# Patient Record
Sex: Male | Born: 1950 | Race: White | Hispanic: No | Smoking: Former smoker
Health system: Southern US, Community
[De-identification: ages and names within clinical notes are randomized; demographics above are authoritative.]

## PROBLEM LIST (undated history)

## (undated) DIAGNOSIS — Z8489 Family history of other specified conditions: Secondary | ICD-10-CM

## (undated) DIAGNOSIS — G8929 Other chronic pain: Secondary | ICD-10-CM

## (undated) DIAGNOSIS — R251 Tremor, unspecified: Secondary | ICD-10-CM

## (undated) DIAGNOSIS — M549 Dorsalgia, unspecified: Secondary | ICD-10-CM

## (undated) DIAGNOSIS — M1990 Unspecified osteoarthritis, unspecified site: Secondary | ICD-10-CM

## (undated) DIAGNOSIS — S8290XA Unspecified fracture of unspecified lower leg, initial encounter for closed fracture: Secondary | ICD-10-CM

## (undated) DIAGNOSIS — K219 Gastro-esophageal reflux disease without esophagitis: Secondary | ICD-10-CM

## (undated) DIAGNOSIS — Z9989 Dependence on other enabling machines and devices: Secondary | ICD-10-CM

## (undated) DIAGNOSIS — G4733 Obstructive sleep apnea (adult) (pediatric): Secondary | ICD-10-CM

## (undated) DIAGNOSIS — Z8619 Personal history of other infectious and parasitic diseases: Secondary | ICD-10-CM

## (undated) DIAGNOSIS — Z860100 Personal history of colon polyps, unspecified: Secondary | ICD-10-CM

## (undated) DIAGNOSIS — Z87442 Personal history of urinary calculi: Secondary | ICD-10-CM

## (undated) DIAGNOSIS — G4719 Other hypersomnia: Secondary | ICD-10-CM

## (undated) DIAGNOSIS — J302 Other seasonal allergic rhinitis: Secondary | ICD-10-CM

## (undated) DIAGNOSIS — I421 Obstructive hypertrophic cardiomyopathy: Secondary | ICD-10-CM

## (undated) DIAGNOSIS — I472 Ventricular tachycardia: Secondary | ICD-10-CM

## (undated) DIAGNOSIS — E7800 Pure hypercholesterolemia, unspecified: Secondary | ICD-10-CM

## (undated) DIAGNOSIS — I4729 Other ventricular tachycardia: Secondary | ICD-10-CM

## (undated) DIAGNOSIS — J449 Chronic obstructive pulmonary disease, unspecified: Secondary | ICD-10-CM

## (undated) HISTORY — DX: Unspecified osteoarthritis, unspecified site: M19.90

## (undated) HISTORY — PX: COLONOSCOPY: SHX174

## (undated) HISTORY — DX: Other ventricular tachycardia: I47.29

## (undated) HISTORY — DX: Other hypersomnia: G47.19

## (undated) HISTORY — PX: COLONOSCOPY W/ BIOPSIES AND POLYPECTOMY: SHX1376

## (undated) HISTORY — DX: Personal history of colon polyps, unspecified: Z86.0100

## (undated) HISTORY — DX: Ventricular tachycardia: I47.2

## (undated) HISTORY — DX: Obstructive hypertrophic cardiomyopathy: I42.1

## (undated) HISTORY — DX: Tremor, unspecified: R25.1

## (undated) HISTORY — DX: Personal history of other infectious and parasitic diseases: Z86.19

## (undated) HISTORY — PX: WISDOM TOOTH EXTRACTION: SHX21

## (undated) HISTORY — PX: TONSILLECTOMY AND ADENOIDECTOMY: SUR1326

## (undated) HISTORY — DX: Personal history of colonic polyps: Z86.010

## (undated) HISTORY — DX: Unspecified fracture of unspecified lower leg, initial encounter for closed fracture: S82.90XA

## (undated) HISTORY — DX: Gastro-esophageal reflux disease without esophagitis: K21.9

## (undated) HISTORY — DX: Other seasonal allergic rhinitis: J30.2

## (undated) HISTORY — DX: Chronic obstructive pulmonary disease, unspecified: J44.9

---

## 2015-01-15 DIAGNOSIS — I10 Essential (primary) hypertension: Secondary | ICD-10-CM | POA: Insufficient documentation

## 2015-04-02 ENCOUNTER — Encounter: Payer: Self-pay | Admitting: Gastroenterology

## 2015-04-02 HISTORY — PX: COLONOSCOPY W/ BIOPSIES AND POLYPECTOMY: SHX1376

## 2015-07-31 ENCOUNTER — Ambulatory Visit: Payer: Self-pay | Admitting: Physician Assistant

## 2015-07-31 VITALS — BP 118/58 | HR 75 | Temp 97.5°F | Resp 16 | Ht 76.0 in | Wt 294.6 lb

## 2015-07-31 DIAGNOSIS — Z0283 Encounter for blood-alcohol and blood-drug test: Secondary | ICD-10-CM
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
## 2015-08-06 LAB — BARBITURATES (GC/LC/MS), URINE
Amobarbital: NEGATIVE ng/mL
Butalbital: NEGATIVE ng/mL
Pentabarbital: NEGATIVE ng/mL
Phenobarbital: 2017 ng/mL — AB
Secobarbital: NEGATIVE ng/mL

## 2015-08-07 LAB — PRESCRIPTION MONITORING PROFILE (10 PANEL)
AMPHETAMINE/METH: NEGATIVE ng/mL
BENZODIAZEPINE SCREEN, URINE: NEGATIVE ng/mL
BUPRENORPHINE, URINE: NEGATIVE ng/mL
CANNABINOID SCRN UR: NEGATIVE ng/mL
COCAINE METABOLITES: NEGATIVE ng/mL
CREATININE, URINE: 158.24 mg/dL (ref >20.0)
METHADONE SCREEN, URINE: NEGATIVE ng/mL
Nitrites, Initial: NEGATIVE ug/mL
OXYCODONE SCRN UR: NEGATIVE ng/mL
Opiate Screen, Urine: NEGATIVE ng/mL
Propoxyphene: NEGATIVE ng/mL
pH, Initial: 5.6 pH (ref 4.5–8.9)

## 2015-10-01 ENCOUNTER — Telehealth: Payer: Self-pay

## 2015-10-04 ENCOUNTER — Encounter: Payer: Self-pay | Admitting: Physician Assistant

## 2015-10-04 ENCOUNTER — Ambulatory Visit (INDEPENDENT_AMBULATORY_CARE_PROVIDER_SITE_OTHER): Payer: BLUE CROSS/BLUE SHIELD | Admitting: Physician Assistant

## 2015-10-04 VITALS — BP 146/80 | HR 60 | Temp 97.8°F | Resp 16 | Ht 75.25 in | Wt 293.4 lb

## 2015-10-04 DIAGNOSIS — R251 Tremor, unspecified: Secondary | ICD-10-CM

## 2015-10-04 DIAGNOSIS — Z23 Encounter for immunization: Secondary | ICD-10-CM | POA: Diagnosis not present

## 2015-10-04 DIAGNOSIS — J302 Other seasonal allergic rhinitis: Secondary | ICD-10-CM

## 2015-10-04 DIAGNOSIS — R5383 Other fatigue: Secondary | ICD-10-CM | POA: Diagnosis not present

## 2015-10-04 DIAGNOSIS — Z125 Encounter for screening for malignant neoplasm of prostate: Secondary | ICD-10-CM

## 2015-10-04 LAB — CBC
HEMATOCRIT: 48.8 % (ref 39.0–52.0)
HEMOGLOBIN: 16.5 g/dL (ref 13.0–17.0)
MCHC: 33.7 g/dL (ref 30.0–36.0)
MCV: 97.3 fl (ref 78.0–100.0)
PLATELETS: 177 10*3/uL (ref 150.0–400.0)
RBC: 5.01 Mil/uL (ref 4.22–5.81)
RDW: 13.1 % (ref 11.5–15.5)
WBC: 8.7 10*3/uL (ref 4.0–10.5)

## 2015-10-04 LAB — VITAMIN D 25 HYDROXY (VIT D DEFICIENCY, FRACTURES): VITD: 37.98 ng/mL (ref 30.00–100.00)

## 2015-10-04 LAB — TESTOSTERONE: TESTOSTERONE: 302.14 ng/dL (ref 300.00–890.00)

## 2015-10-04 LAB — PSA: PSA: 1.84 ng/mL (ref 0.10–4.00)

## 2015-10-04 MED ORDER — AZELASTINE HCL 0.1 % NA SOLN: 2.0000 | Freq: Two times a day (BID) | NASAL | Status: DC

## 2015-10-04 NOTE — Progress Notes (Signed)
Pre visit review using our clinic review tool, if applicable. No additional management support is needed unless otherwise documented below in the visit note/SLS  

## 2015-10-04 NOTE — Telephone Encounter (Signed)
Pre Visit call completed. 

## 2015-10-04 NOTE — Patient Instructions (Signed)
Please continue medications as directed. Stop the Flonase for now and try the Astelin nasal spray I have sent in for you. Continue the Zyrtec.  Call me if symptoms not improving within 1-2 weeks.  Please stop by the lab for blood work. I will call you with your results. We will treat based on findings.  Your flu shot is up-to-date for the year now.  Follow-up will be scheduled based on lab results.

## 2015-10-05 DIAGNOSIS — J302 Other seasonal allergic rhinitis: Secondary | ICD-10-CM | POA: Insufficient documentation

## 2015-10-05 DIAGNOSIS — Z23 Encounter for immunization: Secondary | ICD-10-CM | POA: Insufficient documentation

## 2015-10-05 DIAGNOSIS — R5383 Other fatigue: Secondary | ICD-10-CM | POA: Insufficient documentation

## 2015-10-05 DIAGNOSIS — Z125 Encounter for screening for malignant neoplasm of prostate: Secondary | ICD-10-CM | POA: Insufficient documentation

## 2015-10-05 DIAGNOSIS — R251 Tremor, unspecified: Secondary | ICD-10-CM | POA: Insufficient documentation

## 2015-10-05 NOTE — Assessment & Plan Note (Signed)
Flu shot given by nursing staff. 

## 2015-10-05 NOTE — Assessment & Plan Note (Signed)
Rx Astelin nasal spray. Resume Zyrtec. Follow-up if not improving.

## 2015-10-05 NOTE — Assessment & Plan Note (Signed)
Followed by Neurology. Doing well. Continue current regimen.

## 2015-10-05 NOTE — Progress Notes (Signed)
Patient presents to clinic today to establish care.  Acute Concerns: Patient complains of year-round seasonal allergies, not alleviated by Flonase. Endorses rhinorrhea, watery eyes, and sneezing daily. Endorses sometimes having a dry cough with these symptoms.  Patient also complains of fatigue associated with decreased libido and erectile dysfunction. Is requesting testosterone level check.  Chronic Issues: Tremor -- Followed by Neurology at [facility]. Is currently on combination of primidone, gabapentin and propranolol with good relief of symptoms.  Past Medical History  Diagnosis Date  . Tremors of nervous system   . History of chicken pox   . Arthritis   . GERD (gastroesophageal reflux disease)   . Seasonal allergies   . Environmental allergies   . Kidney stones   . Allergic rhinitis   . Colon polyps   . Carbon dioxide poisoning   . Broken leg     Past Surgical History  Procedure Laterality Date  . Tonsillectomy and adenoidectomy    . Wisdom tooth extraction      Current Outpatient Prescriptions on File Prior to Visit  Medication Sig Dispense Refill  . aspirin 81 MG tablet Take 81 mg by mouth daily.    . Cetirizine HCl 10 MG CAPS Take 10 mg by mouth daily.    . fluticasone (FLONASE) 50 MCG/ACT nasal spray Place into both nostrils daily.    Marland Kitchen gabapentin (NEURONTIN) 800 MG tablet Take 800 mg by mouth 4 (four) times daily.    . primidone (MYSOLINE) 50 MG tablet Take 150 mg by mouth 2 (two) times daily.    . tamsulosin (FLOMAX) 0.4 MG CAPS capsule Take 0.4 mg by mouth at bedtime.    . propranolol (INDERAL) 40 MG tablet Take 40 mg by mouth daily.     No current facility-administered medications on file prior to visit.    Allergies  Allergen Reactions  . Codeine Nausea And Vomiting    All Codeine Related Drugs     Family History  Problem Relation Age of Onset  . Alcoholism Father     Living  . Arthritis Father   . Diabetes Maternal Aunt   . Heart disease  Mother   . Congestive Heart Failure Mother 67    Deceased  . Cancer Other     PGGM  . Breast cancer Maternal Aunt   . Lung cancer Maternal Uncle   . Heart disease Brother   . Heart attack Brother   . Congestive Heart Failure Brother 47    Deceased  . Stroke Maternal Aunt   . Emphysema Brother     #2  . Arthritis Sister     #1  . Allergies Daughter   . Kidney Stones Daughter   . Gallbladder disease Daughter   . Migraines Daughter     Social History   Social History  . Marital Status: Single    Spouse Name: N/A  . Number of Children: 3  . Years of Education: N/A   Occupational History  . [employer] Technician    Social History Main Topics  . Smoking status: Former Smoker    Quit date: 11/20/1998  . Smokeless tobacco: Not on file  . Alcohol Use: Not on file  . Drug Use: Not on file  . Sexual Activity: Not on file   Other Topics Concern  . Not on file   Social History Narrative    Review of Systems  Constitutional: Positive for malaise/fatigue. Negative for fever and weight loss.  HENT: Positive for congestion. Negative for ear  discharge, ear pain, hearing loss and tinnitus.   Eyes: Negative for blurred vision, double vision, photophobia and pain.  Respiratory: Negative for cough and shortness of breath.   Cardiovascular: Negative for chest pain and palpitations.  Gastrointestinal: Negative for heartburn, nausea, vomiting, abdominal pain, diarrhea, constipation, blood in stool and melena.  Genitourinary: Negative for dysuria, urgency, frequency, hematuria and flank pain.  Musculoskeletal: Negative for falls.  Neurological: Positive for tremors. Negative for dizziness, loss of consciousness and headaches.  Endo/Heme/Allergies: Negative for environmental allergies.  Psychiatric/Behavioral: Negative for depression, suicidal ideas, hallucinations and substance abuse. The patient is not nervous/anxious and does not have insomnia.    BP 146/80 mmHg  Pulse 60  Temp(Src)  97.8 F (36.6 C) (Oral)  Resp 16  Ht 6' 3.25" (1.911 m)  Wt 293 lb 6 oz (133.074 kg)  BMI 36.44 kg/m2  SpO2 95%  Physical Exam  Constitutional: He is oriented to person, place, and time and well-developed, well-nourished, and in no distress.  HENT:  Head: Normocephalic and atraumatic.  Right Ear: External ear normal.  Left Ear: External ear normal.  Nose: Nose normal.  Mouth/Throat: Oropharynx is clear and moist. No oropharyngeal exudate.  TM within normal limits bilaterally.  Eyes: Conjunctivae are normal.  Neck: Neck supple.  Cardiovascular: Normal rate, regular rhythm, normal heart sounds and intact distal pulses.   Neurological: He is alert and oriented to person, place, and time.  Skin: Skin is warm and dry. No rash noted.  Psychiatric: Affect normal.  Vitals reviewed.   Recent Results (from the past 2160 hour(s))  Prescript Monitor Profile(10)     Status: None   Collection Time: 07/31/15  9:04 AM  Result Value Ref Range   Creatinine, Urine 158.24 >20.0 mg/dL   pH, Initial 5.6 4.5 - 8.9 pH   Nitrites, Initial NEG Cutoff:200 ug/mL   Amphetamine/Meth NEG Cutoff:500 ng/mL   Barbiturate Screen, Urine PPS Cutoff:200 ng/mL   Benzodiazepine Screen, Urine NEG Cutoff:100 ng/mL   Buprenorphine, Urine NEG Cutoff:10 ng/mL   Cannabinoid Scrn, Ur NEG Cutoff:50 ng/mL   Cocaine Metabolites NEG Cutoff:150 ng/mL   Methadone Screen, Urine NEG Cutoff:300 ng/mL   Oxycodone Screen, Ur NEG Cutoff:100 ng/mL   Propoxyphene NEG Cutoff:300 ng/mL   Opiate Screen, Urine NEG Cutoff:100 ng/mL   Prescribed Drug 1 NONE PROVIDED     Comment: * (PPS) Presumptive positive screen result to be verified by         quantitative LC/MS or GC/MS confirmation testing.   Barbiturates (GC/LC/MS), urine     Status: Abnormal   Collection Time: 07/31/15  9:04 AM  Result Value Ref Range   Amobarbital NEG <100 ng/mL   Butalbital NEG <100 ng/mL   Pentabarbital NEG <100 ng/mL   Secobarbital NEG <100 ng/mL    Phenobarbital 2017 (A) <100 ng/mL  Testosterone     Status: None   Collection Time: 10/04/15 10:36 AM  Result Value Ref Range   Testosterone 302.14 300.00 - 890.00 ng/dL  PSA     Status: None   Collection Time: 10/04/15 10:36 AM  Result Value Ref Range   PSA 1.84 0.10 - 4.00 ng/mL  Vitamin D (25 hydroxy)     Status: None   Collection Time: 10/04/15 10:36 AM  Result Value Ref Range   VITD 37.98 30.00 - 100.00 ng/mL  CBC     Status: None   Collection Time: 10/04/15 10:36 AM  Result Value Ref Range   WBC 8.7 4.0 - 10.5 K/uL   RBC  5.01 4.22 - 5.81 Mil/uL   Platelets 177.0 150.0 - 400.0 K/uL   Hemoglobin 16.5 13.0 - 17.0 g/dL   HCT 21.348.8 08.639.0 - 57.852.0 %   MCV 97.3 78.0 - 100.0 fl   MCHC 33.7 30.0 - 36.0 g/dL   RDW 46.913.1 62.911.5 - 52.815.5 %    Assessment/Plan: Encounter for immunization Flu shot given by nursing staff.  Other fatigue Will obtain lab panel to include testosterone level to assess.  Prostate cancer screening Will obtain PSA level today.  Seasonal allergies Rx Astelin nasal spray. Resume Zyrtec. Follow-up if not improving.  Tremor Followed by Neurology. Doing well. Continue current regimen.

## 2015-10-05 NOTE — Assessment & Plan Note (Signed)
Will obtain lab panel to include testosterone level to assess.

## 2015-10-05 NOTE — Assessment & Plan Note (Signed)
Will obtain PSA level today. 

## 2015-11-25 ENCOUNTER — Ambulatory Visit: Payer: BLUE CROSS/BLUE SHIELD | Admitting: Family Medicine

## 2015-11-26 ENCOUNTER — Encounter: Payer: Self-pay | Admitting: Family Medicine

## 2015-11-26 ENCOUNTER — Telehealth: Payer: Self-pay

## 2015-11-26 ENCOUNTER — Ambulatory Visit (INDEPENDENT_AMBULATORY_CARE_PROVIDER_SITE_OTHER): Payer: BLUE CROSS/BLUE SHIELD | Admitting: Family Medicine

## 2015-11-26 VITALS — BP 110/78 | HR 52 | Temp 97.9°F | Resp 16 | Ht 75.25 in | Wt 302.5 lb

## 2015-11-26 DIAGNOSIS — J0100 Acute maxillary sinusitis, unspecified: Secondary | ICD-10-CM | POA: Diagnosis not present

## 2015-11-26 MED ORDER — AMOXICILLIN 875 MG PO TABS: 875.0000 mg | ORAL_TABLET | Freq: Two times a day (BID) | ORAL | Status: AC

## 2015-11-26 NOTE — Telephone Encounter (Signed)
Patient is receiving a $500 bill from [facility] where we did a drug screen on this patient.  Can someone for lab take a look at this and let the patient know why he is receiving this bill.

## 2015-11-26 NOTE — Telephone Encounter (Signed)
Not really sure why we did this as a [facility] since this was before we switched to [facility]. Talked to Vanessa DurhamKim Summers. Sounds like we may have already billed his company for this DS. Just doctor billed his [facility] lab since this was clearly a mistake on our part.

## 2015-11-26 NOTE — Progress Notes (Signed)
Pre visit review using our clinic review tool, if applicable. No additional management support is needed unless otherwise documented below in the visit note. 

## 2015-11-26 NOTE — Progress Notes (Signed)
OFFICE VISIT  11/26/2015   CC:  Chief Complaint  Patient presents with  . URI    x 2 weeks   HPI:    Patient is a 65 y.o. Caucasian male who presents for respiratory complaints. Onset about 2 wks ago nasal congestion/sinus pressure and pain, PND, ST, a little cough.   No fevers since the early part of the illness.  +HA.  No n/v/d.  No SOB/wheeze or body aches. Tried vicks and sudafed on top of the chronic zyrtec, astelin, and flonase he uses.   Past Medical History  Diagnosis Date  . Tremors of nervous system   . History of chicken pox   . Arthritis   . GERD (gastroesophageal reflux disease)   . Seasonal allergies   . Environmental allergies   . Kidney stones   . Allergic rhinitis   . Colon polyps   . Carbon dioxide poisoning   . Broken leg     Past Surgical History  Procedure Laterality Date  . Tonsillectomy and adenoidectomy    . Wisdom tooth extraction      Outpatient Prescriptions Prior to Visit  Medication Sig Dispense Refill  . Ascorbic Acid (VITAMIN C) 1000 MG tablet Take 1,000 mg by mouth daily.    Marland Kitchen. aspirin 81 MG tablet Take 81 mg by mouth daily.    Marland Kitchen. azelastine (ASTELIN) 0.1 % nasal spray Place 2 sprays into both nostrils 2 (two) times daily. Use in each nostril as directed 30 mL 12  . Cetirizine HCl 10 MG CAPS Take 10 mg by mouth daily.    . Cholecalciferol (VITAMIN D-3) 1000 UNITS CAPS Take 1,000 Units by mouth daily.    . fluticasone (FLONASE) 50 MCG/ACT nasal spray Place into both nostrils daily.    Marland Kitchen. gabapentin (NEURONTIN) 800 MG tablet Take 800 mg by mouth 4 (four) times daily.    Marland Kitchen. ibuprofen (ADVIL,MOTRIN) 200 MG tablet Take 200 mg by mouth every 6 (six) hours as needed.    . primidone (MYSOLINE) 50 MG tablet Take 150 mg by mouth 2 (two) times daily.    . propranolol (INDERAL) 40 MG tablet Take 40 mg by mouth daily.    . pseudoephedrine (SUDAFED) 30 MG tablet Take 30 mg by mouth every 4 (four) hours as needed for congestion.    . tamsulosin (FLOMAX)  0.4 MG CAPS capsule Take 0.4 mg by mouth at bedtime.    . vitamin B-12 (CYANOCOBALAMIN) 1000 MCG tablet Take 1,000 mcg by mouth daily.     No facility-administered medications prior to visit.    Allergies  Allergen Reactions  . Codeine Nausea And Vomiting    All Codeine Related Drugs     ROS As per HPI  PE: Blood pressure 110/78, pulse 52, temperature 97.9 F (36.6 C), temperature source Oral, resp. rate 16, height 6' 3.25" (1.911 m), weight 302 lb 8 oz (137.213 kg), SpO2 95 %. VS: noted--normal. Gen: alert, NAD, NONTOXIC APPEARING. HEENT: eyes without injection, drainage, or swelling.  Ears: EACs clear, TMs with normal light reflex and landmarks.  Nose: Clear rhinorrhea, with some dried, crusty exudate adherent to mildly injected mucosa.  No purulent d/c.  R>L paranasal sinus TTP.  No facial swelling.  Throat and mouth without focal lesion.  No pharyngial swelling, erythema, or exudate.   Neck: supple, no LAD.   LUNGS: CTA bilat, nonlabored resps.   CV: RRR, no m/r/g. EXT: no c/c/e SKIN: no rash  LABS:  none  IMPRESSION AND PLAN:  Acute  sinusitis, R>L maxillary. Amoxil 875mg  bid x 10d. Continue current symptomatic care. Signs/symptoms to call or return for were reviewed and pt expressed understanding.  An After Visit Summary was printed and given to the patient.  FOLLOW UP: Return if symptoms worsen or fail to improve.

## 2016-05-30 ENCOUNTER — Ambulatory Visit (INDEPENDENT_AMBULATORY_CARE_PROVIDER_SITE_OTHER): Payer: BLUE CROSS/BLUE SHIELD | Admitting: Physician Assistant

## 2016-05-30 ENCOUNTER — Encounter: Payer: Self-pay | Admitting: Physician Assistant

## 2016-05-30 VITALS — BP 125/69 | HR 44 | Temp 97.5°F | Resp 18 | Ht 75.0 in | Wt 308.1 lb

## 2016-05-30 DIAGNOSIS — Z0001 Encounter for general adult medical examination with abnormal findings: Secondary | ICD-10-CM | POA: Diagnosis not present

## 2016-05-30 DIAGNOSIS — R251 Tremor, unspecified: Secondary | ICD-10-CM | POA: Diagnosis not present

## 2016-05-30 DIAGNOSIS — F17210 Nicotine dependence, cigarettes, uncomplicated: Secondary | ICD-10-CM

## 2016-05-30 DIAGNOSIS — R9431 Abnormal electrocardiogram [ECG] [EKG]: Secondary | ICD-10-CM | POA: Diagnosis not present

## 2016-05-30 DIAGNOSIS — Z125 Encounter for screening for malignant neoplasm of prostate: Secondary | ICD-10-CM | POA: Diagnosis not present

## 2016-05-30 DIAGNOSIS — J302 Other seasonal allergic rhinitis: Secondary | ICD-10-CM | POA: Diagnosis not present

## 2016-05-30 DIAGNOSIS — R001 Bradycardia, unspecified: Secondary | ICD-10-CM

## 2016-05-30 DIAGNOSIS — Z0000 Encounter for general adult medical examination without abnormal findings: Secondary | ICD-10-CM

## 2016-05-30 DIAGNOSIS — Z6838 Body mass index (BMI) 38.0-38.9, adult: Secondary | ICD-10-CM

## 2016-05-30 LAB — CBC
HEMATOCRIT: 44.3 % (ref 39.0–52.0)
Hemoglobin: 15 g/dL (ref 13.0–17.0)
MCHC: 33.9 g/dL (ref 30.0–36.0)
MCV: 97.6 fl (ref 78.0–100.0)
PLATELETS: 148 10*3/uL — AB (ref 150.0–400.0)
RBC: 4.54 Mil/uL (ref 4.22–5.81)
RDW: 12.8 % (ref 11.5–15.5)
WBC: 5.9 10*3/uL (ref 4.0–10.5)

## 2016-05-30 LAB — COMPREHENSIVE METABOLIC PANEL
ALBUMIN: 4.2 g/dL (ref 3.5–5.2)
ALT: 22 U/L (ref 0–53)
AST: 26 U/L (ref 0–37)
Alkaline Phosphatase: 53 U/L (ref 39–117)
BUN: 17 mg/dL (ref 6–23)
CALCIUM: 9.4 mg/dL (ref 8.4–10.5)
CHLORIDE: 102 meq/L (ref 96–112)
CO2: 33 meq/L — AB (ref 19–32)
CREATININE: 0.94 mg/dL (ref 0.40–1.50)
GFR: 85.74 mL/min (ref >60.00)
Glucose, Bld: 94 mg/dL (ref 70–99)
POTASSIUM: 4.6 meq/L (ref 3.5–5.1)
SODIUM: 139 meq/L (ref 135–145)
Total Bilirubin: 0.5 mg/dL (ref 0.2–1.2)
Total Protein: 7 g/dL (ref 6.0–8.3)

## 2016-05-30 LAB — TSH: TSH: 2.83 u[IU]/mL (ref 0.35–4.50)

## 2016-05-30 LAB — LIPID PANEL
CHOL/HDL RATIO: 5
Cholesterol: 173 mg/dL (ref 0–200)
HDL: 32.7 mg/dL — ABNORMAL LOW (ref >39.00)
LDL CALC: 121 mg/dL — AB (ref 0–99)
NonHDL: 140.59
TRIGLYCERIDES: 100 mg/dL (ref 0.0–149.0)
VLDL: 20 mg/dL (ref 0.0–40.0)

## 2016-05-30 LAB — URINALYSIS, ROUTINE W REFLEX MICROSCOPIC
Bilirubin Urine: NEGATIVE
Hgb urine dipstick: NEGATIVE
Ketones, ur: NEGATIVE
LEUKOCYTES UA: NEGATIVE
Nitrite: NEGATIVE
PH: 6 (ref 5.0–8.0)
RBC / HPF: NONE SEEN (ref 0–?)
SPECIFIC GRAVITY, URINE: 1.02 (ref 1.000–1.030)
TOTAL PROTEIN, URINE-UPE24: NEGATIVE
UROBILINOGEN UA: 0.2 (ref 0.0–1.0)
Urine Glucose: NEGATIVE

## 2016-05-30 LAB — PSA: PSA: 1.38 ng/mL (ref 0.10–4.00)

## 2016-05-30 LAB — TROPONIN I: TNIDX: 0.02 ug/l (ref 0.00–0.06)

## 2016-05-30 LAB — HEMOGLOBIN A1C: HEMOGLOBIN A1C: 5.4 % (ref 4.6–6.5)

## 2016-05-30 MED ORDER — TAMSULOSIN HCL 0.4 MG PO CAPS: 0.4000 mg | ORAL_CAPSULE | Freq: Every day | ORAL | Status: DC

## 2016-05-30 NOTE — Progress Notes (Signed)
Pre visit review using our clinic review tool, if applicable. No additional management support is needed unless otherwise documented below in the visit note/SLS  

## 2016-05-30 NOTE — Progress Notes (Signed)
Patient presents to clinic today for annual exam.  Patient is fasting for labs.  Chronic Issues: Seasonal Allergies -- Is taking Astelin nasal spray, Flonase and Claritin with good relief of symptoms.   Tremor -- Followed by Neurology. Is currently on Propranolol and Gabapentin with recent dose change of Propranolol by Neurology.   Hx of Tobacco Abuse -- 60+ pack-year smoking history. Denies cough, hemoptysis, wheezing or history of COPD. Has never had Lung Ca screening.   Obesity  -- Body mass index is 38.51 kg/(m^2). Is currently staying very active with home projects. Does not have a regular exercise routine. Endorses well-balanced diet overall but notes more fried foods than he should have.  Health Maintenance: Immunizations -- Due for Tetanus. Will get TDaP today. Colonoscopy -- Endorses Colonoscopy in 2016 at [facility]. Endorses polyps and need for repeat colonoscopy in 5 years. HIV Screen -- Agrees to this. Is not sexually active but has been previously.  Hepatitis C Screen -- Will check coverage with insurance plan.   Past Medical History  Diagnosis Date  . Tremors of nervous system   . History of chicken pox   . Arthritis   . GERD (gastroesophageal reflux disease)   . Seasonal allergies   . Environmental allergies   . Kidney stones   . Allergic rhinitis   . Colon polyps   . Carbon dioxide poisoning   . Broken leg     Past Surgical History  Procedure Laterality Date  . Tonsillectomy and adenoidectomy    . Wisdom tooth extraction      Current Outpatient Prescriptions on File Prior to Visit  Medication Sig Dispense Refill  . Ascorbic Acid (VITAMIN C) 1000 MG tablet Take 1,000 mg by mouth daily.    Marland Kitchen aspirin 81 MG tablet Take 81 mg by mouth daily.    Marland Kitchen azelastine (ASTELIN) 0.1 % nasal spray Place 2 sprays into both nostrils 2 (two) times daily. Use in each nostril as directed 30 mL 12  . Cetirizine HCl 10 MG CAPS Take 10 mg by mouth daily.    .  Cholecalciferol (VITAMIN D-3) 1000 UNITS CAPS Take 1,000 Units by mouth daily.    . fluticasone (FLONASE) 50 MCG/ACT nasal spray Place into both nostrils daily.    Marland Kitchen gabapentin (NEURONTIN) 800 MG tablet Take 800 mg by mouth 4 (four) times daily.    Marland Kitchen ibuprofen (ADVIL,MOTRIN) 200 MG tablet Take 200 mg by mouth every 6 (six) hours as needed.    . primidone (MYSOLINE) 50 MG tablet Take 150 mg by mouth 2 (two) times daily.    . propranolol (INDERAL) 40 MG tablet Take 40 mg by mouth daily.    . pseudoephedrine (SUDAFED) 30 MG tablet Take 30 mg by mouth every 4 (four) hours as needed for congestion.    . tamsulosin (FLOMAX) 0.4 MG CAPS capsule Take 0.4 mg by mouth at bedtime.    . vitamin B-12 (CYANOCOBALAMIN) 1000 MCG tablet Take 1,000 mcg by mouth daily.     No current facility-administered medications on file prior to visit.    Allergies  Allergen Reactions  . Codeine Nausea And Vomiting    All Codeine Related Drugs     Family History  Problem Relation Age of Onset  . Alcoholism Father     Living  . Arthritis Father   . Diabetes Maternal Aunt   . Heart disease Mother   . Congestive Heart Failure Mother 31    Deceased  . Cancer  Other     PGGM  . Breast cancer Maternal Aunt   . Lung cancer Maternal Uncle   . Heart disease Brother   . Heart attack Brother   . Congestive Heart Failure Brother 47    Deceased  . Stroke Maternal Aunt   . Emphysema Brother     #2  . Arthritis Sister     #1  . Allergies Daughter   . Kidney Stones Daughter   . Gallbladder disease Daughter   . Migraines Daughter     Social History   Social History  . Marital Status: Single    Spouse Name: N/A  . Number of Children: 3  . 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 Smoking status: Former Smoker    Quit date: 11/20/1998  . Smokeless tobacco: Not on file  . Alcohol Use: Not on file  . Drug Use: Not on file  . Sexual Activity: Not on  file   Other Topics Concern  . Not on file   Social History Narrative   Review of Systems  Constitutional: Negative for fever and weight loss.  HENT: Negative for ear discharge, ear pain, hearing loss and tinnitus.   Eyes: Negative for blurred vision, double vision, photophobia and pain.  Respiratory: Negative for cough and shortness of breath.   Cardiovascular: Negative for chest pain and palpitations.  Gastrointestinal: Negative for heartburn, nausea, vomiting, abdominal pain, diarrhea, constipation, blood in stool and melena.  Genitourinary: Negative for dysuria, urgency, frequency, hematuria and flank pain.  Musculoskeletal: Positive for joint pain. Negative for falls.  Neurological: Positive for tremors. Negative for dizziness, loss of consciousness and headaches.  Endo/Heme/Allergies: Negative for environmental allergies.  Psychiatric/Behavioral: Negative for depression, suicidal ideas, hallucinations and substance abuse. The patient is not nervous/anxious and does not have insomnia.     Temp(Src) 97.5 F (36.4 C) (Oral)  Ht 6\' 3"  (1.905 m)  Wt 308 lb 2 oz (139.765 kg)  BMI 38.51 kg/m2  SpO2 96%  Physical Exam  Constitutional: He is oriented to person, place, and time and well-developed, well-nourished, and in no distress.  HENT:  Head: Normocephalic and atraumatic.  Right Ear: External ear normal.  Left Ear: External ear normal.  Nose: Nose normal.  Mouth/Throat: Oropharynx is clear and moist. No oropharyngeal exudate.  Eyes: Conjunctivae and EOM are normal. Pupils are equal, round, and reactive to light.  Neck: Neck supple. No thyromegaly present.  Cardiovascular: Normal rate, regular rhythm, normal heart sounds and intact distal pulses.   Pulmonary/Chest: Effort normal and breath sounds normal. No respiratory distress. He has no wheezes. He has no rales. He exhibits no tenderness.  Abdominal: Soft. Bowel sounds are normal. He exhibits no distension and no mass. There  is no tenderness. There is no rebound and no guarding.  Genitourinary: Testes/scrotum normal and penis normal. No discharge found.  Musculoskeletal:       Right shoulder: He exhibits pain. He exhibits normal range of motion, no tenderness, no bony tenderness, no swelling and normal strength.  Lymphadenopathy:    He has no cervical adenopathy.  Neurological: He is alert and oriented to person, place, and time. He displays tremor.  Skin: Skin is warm and dry. No rash noted.  Psychiatric: Affect normal.  Vitals reviewed.   No results found for this or any previous visit (from the past 2160 hour(s)).  Assessment/Plan: 1. Bradycardia Noted on examination. Asymptomatic. Secondary to significant  dose of Propranolol prescribed by Neurology for patient tremor. EKG obtained revealing sinus bradycardia with t abnormality . Will check TSH and CMP. Propranolol decreased from 40 mg BID to 20 mg BID. Close FU scheduled. Alarm signs/symptoms reviewed with patient. FU scheduled. - EKG 12-Lead - Comprehensive metabolic panel - TSH  2. Smoking greater than 40 pack years Asymptomatic. Good oxygen saturation. Will try to get low dose CT approved for lung cancer screening. - CT CHEST LUNG CANCER SCREENING LOW DOSE WO CONTRAST; Future  3. Prostate cancer screening The natural history of prostate cancer and ongoing controversy regarding screening and potential treatment outcomes of prostate cancer has been discussed with the patient. The meaning of a false positive PSA and a false negative PSA has been discussed. He indicates understanding of the limitations of this screening test and wishes to proceed with screening PSA testing.  - PSA  4. Tremor Followed by Neurology. Well controlled with Propranolol 40 mg BID. However this dose is causing dangerously low heart rate. Have decreased to 20 mg BID with close FU. Patient to contact Neurology to discuss other options for tremor.  5. Seasonal allergies Doing  very well. Continue current medication regimen.  6. Visit for preventive health examination Depression screen negative. Health Maintenance reviewed -- TDaP updated. Colonoscopy up-to-date. Patient will check on insurance coverage for Hep C screening. Preventive schedule discussed and handout given in AVS. Will obtain fasting labs today.  - CBC - Comprehensive metabolic panel - Hemoglobin A1c - Lipid panel - TSH - Urinalysis, Routine w reflex microscopic ([facility])  7. Abnormal finding on EKG EKG with marked sinus bradycardia. EKGg reads ST depression. Reviewed manually with no note of ST depression. Supervising MD, Dr. Abner GreenspanBlyth consulted who agrees no ST depression. There is T-abnormality noted. STAT troponin obtained and negative. Will work on improvement of bradycardia (see a/p). Referral to Cardiology placed for stress test and echo. - Troponin I  8. Body mass index (BMI) of 38.0-38.9 in adult Body mass index is 38.51 kg/(m^2). Diet and exercise guidelines reviewed with patient. Handout given. Will follow.   Piedad ClimesMartin, Tyrae Cody, PA-C

## 2016-05-30 NOTE — Patient Instructions (Addendum)
Please go to the lab for blood work.   Our office will call you with your results unless you have chosen to receive results via MyChart.  If your blood work is normal we will follow-up each year for physicals and as scheduled for chronic medical problems.  If anything is abnormal we will treat accordingly and get you in for a follow-up.  You will be contacted by Cardiology for assessment of EKG findings -- will likely be getting Echo and stress test. Decrease your Propranolol to once daily dosing. The current dose is causing your heart rate to be too low which may be causing decreased blood flow to the heart muscle.   Call your neurologist to make him aware of these changes. They will need to consider another medication for your tremor.  If you develop any chest pain or SOB, please go to the ER.  You will be contacted for assessment by General Surgery for hernia.  Follow-up with me Friday

## 2016-06-02 ENCOUNTER — Ambulatory Visit (INDEPENDENT_AMBULATORY_CARE_PROVIDER_SITE_OTHER): Payer: BLUE CROSS/BLUE SHIELD | Admitting: Physician Assistant

## 2016-06-02 ENCOUNTER — Encounter: Payer: Self-pay | Admitting: Physician Assistant

## 2016-06-02 VITALS — BP 122/78 | HR 50 | Temp 97.6°F | Resp 16 | Ht 75.0 in | Wt 305.5 lb

## 2016-06-02 DIAGNOSIS — R001 Bradycardia, unspecified: Secondary | ICD-10-CM

## 2016-06-02 MED ORDER — PROPRANOLOL HCL 10 MG PO TABS: 10.0000 mg | ORAL_TABLET | Freq: Two times a day (BID) | ORAL | Status: DC

## 2016-06-02 NOTE — Progress Notes (Signed)
Patient presents to clinic today for 3-day follow-up of significant asymptomatic bradycardia. Pulse noted to be 40 at last visit. Patient was on Propranolol 40 mg BID by his neurologist for tremor. EKG obtained at the time revealed sinus bradycardia with t-wave abnormality. No chest pain, palpitations. LH or dizziness. Referral to Card for stress testing and echo placed but no emergent concerns. Propranolol reduced to 20 mg BID. Patient endorses following instructions. Still denies chest pain, palpitations, lightheadedness, dizziness, vision changes or frequent headaches. Has contacted Neurology to discuss other options for tremor.    Past Medical History  Diagnosis Date  . Tremors of nervous system   . History of chicken pox   . Arthritis   . GERD (gastroesophageal reflux disease)   . Seasonal allergies   . Environmental allergies   . Kidney stones   . Allergic rhinitis   . Colon polyps   . Carbon dioxide poisoning   . Broken leg     Current Outpatient Prescriptions on File Prior to Visit  Medication Sig Dispense Refill  . Ascorbic Acid (VITAMIN C) 1000 MG tablet Take 1,000 mg by mouth daily.    Marland Kitchen aspirin 81 MG tablet Take 81 mg by mouth daily.    Marland Kitchen azelastine (ASTELIN) 0.1 % nasal spray Place 2 sprays into both nostrils 2 (two) times daily. Use in each nostril as directed 30 mL 12  . Cetirizine HCl 10 MG CAPS Take 10 mg by mouth daily.    . Cholecalciferol (VITAMIN D-3) 1000 UNITS CAPS Take 1,000 Units by mouth daily.    . fluticasone (FLONASE) 50 MCG/ACT nasal spray Place into both nostrils daily.    Marland Kitchen gabapentin (NEURONTIN) 800 MG tablet Take 800 mg by mouth 4 (four) times daily.    Marland Kitchen ibuprofen (ADVIL,MOTRIN) 200 MG tablet Take 200 mg by mouth every 6 (six) hours as needed.    . primidone (MYSOLINE) 50 MG tablet Take 150 mg by mouth 2 (two) times daily.    . tamsulosin (FLOMAX) 0.4 MG CAPS capsule Take 1 capsule (0.4 mg total) by mouth at bedtime. 30 capsule 5  . vitamin B-12  (CYANOCOBALAMIN) 1000 MCG tablet Take 1,000 mcg by mouth daily.     No current facility-administered medications on file prior to visit.    Allergies  Allergen Reactions  . Codeine Nausea And Vomiting    All Codeine Related Drugs     Family History  Problem Relation Age of Onset  . Alcoholism Father     Living  . Arthritis Father   . Diabetes Maternal Aunt   . Heart disease Mother   . Congestive Heart Failure Mother 25    Deceased  . Cancer Other     PGGM  . Breast cancer Maternal Aunt   . Lung cancer Maternal Uncle   . Heart disease Brother   . Heart attack Brother   . Congestive Heart Failure Brother 47    Deceased  . Stroke Maternal Aunt   . Emphysema Brother     #2  . Arthritis Sister     #1  . Allergies Daughter   . Kidney Stones Daughter   . Gallbladder disease Daughter   . Migraines Daughter     Social History   Social History  . Marital Status: Single    Spouse Name: N/A  . Number of Children: 3  . 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  Smoking status: Former Smoker -- 2.00 packs/day for 30 years    Types: Cigarettes    Quit date: 11/20/2000  . Smokeless tobacco: Never Used  . Alcohol Use: None  . Drug Use: None  . Sexual Activity: Not Asked   Other Topics Concern  . None   Social History Narrative   Review of Systems - See HPI.  All other ROS are negative.  BP 122/78 mmHg  Pulse 50  Temp(Src) 97.6 F (36.4 C) (Oral)  Resp 16  Ht  (1.905 m)  Wt 305 lb 8 oz (138.574 kg)  BMI 38.18 kg/m2  SpO2 97%  Physical Exam  Constitutional: He is oriented to person, place, and time and well-developed, well-nourished, and in no distress.  HENT:  Head: Normocephalic and atraumatic.  Eyes: Conjunctivae are normal.  Cardiovascular: Normal rate, regular rhythm, normal heart sounds and intact distal pulses.   Pulmonary/Chest: Effort normal. No respiratory distress. He has no wheezes.  He has no rales. He exhibits no tenderness.  Neurological: He is alert and oriented to person, place, and time.  Skin: Skin is warm and dry. No rash noted.  Psychiatric: Affect normal.  Vitals reviewed.   Recent Results (from the past 2160 hour(s))  CBC     Status: Abnormal   Collection Time: 05/30/16  8:56 AM  Result Value Ref Range   WBC 5.9 4.0 - 10.5 K/uL   RBC 4.54 4.22 - 5.81 Mil/uL   Platelets 148.0 (L) 150.0 - 400.0 K/uL   Hemoglobin 15.0 13.0 - 17.0 g/dL   HCT 40.9 81.1 - 91.4 %   MCV 97.6 78.0 - 100.0 fl   MCHC 33.9 30.0 - 36.0 g/dL   RDW 78.2 95.6 - 21.3 %  Comprehensive metabolic panel     Status: Abnormal   Collection Time: 05/30/16  8:56 AM  Result Value Ref Range   Sodium 139 135 - 145 mEq/L   Potassium 4.6 3.5 - 5.1 mEq/L   Chloride 102 96 - 112 mEq/L   CO2 33 (H) 19 - 32 mEq/L   Glucose, Bld 94 70 - 99 mg/dL   BUN 17 6 - 23 mg/dL   Creatinine, Ser 0.86 0.40 - 1.50 mg/dL   Total Bilirubin 0.5 0.2 - 1.2 mg/dL   Alkaline Phosphatase 53 39 - 117 U/L   AST 26 0 - 37 U/L   ALT 22 0 - 53 U/L   Total Protein 7.0 6.0 - 8.3 g/dL   Albumin 4.2 3.5 - 5.2 g/dL   Calcium 9.4 8.4 - 57.8 mg/dL   GFR 46.96 >29.52 mL/min  Hemoglobin A1c     Status: None   Collection Time: 05/30/16  8:56 AM  Result Value Ref Range   Hgb A1c MFr Bld 5.4 4.6 - 6.5 %    Comment: Glycemic Control Guidelines for People with Diabetes:Non Diabetic:  <6%Goal of Therapy: <7%Additional Action Suggested:  >8%   Lipid panel     Status: Abnormal   Collection Time: 05/30/16  8:56 AM  Result Value Ref Range   Cholesterol 173 0 - 200 mg/dL    Comment: ATP III Classification       Desirable:  < 200 mg/dL               Borderline High:  200 - 239 mg/dL          High:  > = 841 mg/dL   Triglycerides 324.4 0.0 - 149.0 mg/dL    Comment: Normal:  <010 mg/dLBorderline  High:  150 - 199 mg/dL   HDL 16.1032.70 (L) >96.04>39.00 mg/dL   VLDL 54.020.0 0.0 - 98.140.0 mg/dL   LDL Cholesterol 191121 (H) 0 - 99 mg/dL   Total CHOL/HDL  Ratio 5     Comment:                Men          Women1/2 Average Risk     3.4          3.3Average Risk          5.0          4.42X Average Risk          9.6          7.13X Average Risk          15.0          11.0                       NonHDL 140.59     Comment: NOTE:  Non-HDL goal should be 30 mg/dL higher than patient's LDL goal (i.e. LDL goal of < 70 mg/dL, would have non-HDL goal of < 100 mg/dL)  PSA     Status: None   Collection Time: 05/30/16  8:56 AM  Result Value Ref Range   PSA 1.38 0.10 - 4.00 ng/mL  TSH     Status: None   Collection Time: 05/30/16  8:56 AM  Result Value Ref Range   TSH 2.83 0.35 - 4.50 uIU/mL  Urinalysis, Routine w reflex microscopic ([facility])     Status: Abnormal   Collection Time: 05/30/16  8:56 AM  Result Value Ref Range   Color, Urine YELLOW Yellow;Lt. Yellow   APPearance CLEAR Clear   Specific Gravity, Urine 1.020 1.000-1.030   pH 6.0 5.0 - 8.0   Total Protein, Urine NEGATIVE Negative   Urine Glucose NEGATIVE Negative   Ketones, ur NEGATIVE Negative   Bilirubin Urine NEGATIVE Negative   Hgb urine dipstick NEGATIVE Negative   Urobilinogen, UA 0.2 0.0 - 1.0   Leukocytes, UA NEGATIVE Negative   Nitrite NEGATIVE Negative   WBC, UA 0-2/hpf 0-2/hpf   RBC / HPF none seen 0-2/hpf   Mucus, UA Presence of (A) None  Troponin I     Status: None   Collection Time: 05/30/16  8:56 AM  Result Value Ref Range   TNIDX 0.02 0.00 - 0.06 ug/l    Assessment/Plan: Bradycardia Heart rate improved to the low 50s. Still asymptomatic. Will decrease Propranolol to 10 mg BID. Will await stress test and echo results. FU scheduled. Again Alarm signs/symptoms discussed with patient. ER if these occur.     Piedad ClimesMartin, Taison Cody, PA-C

## 2016-06-02 NOTE — Assessment & Plan Note (Signed)
Heart rate improved to the low 50s. Still asymptomatic. Will decrease Propranolol to 10 mg BID. Will await stress test and echo results. FU scheduled. Again Alarm signs/symptoms discussed with patient. ER if these occur.

## 2016-06-02 NOTE — Patient Instructions (Signed)
Please start the new dose of Propranolol -- 10 mg twice daily. Make sure to call me once you have spoken with Neurology. Again you will be contacted for stress testing.  I will speak with insurance regarding the denial of your lung cancer screen.  FU with nurse in 1 week for a BP and heart rate recheck.

## 2016-06-02 NOTE — Progress Notes (Signed)
Pre visit review using our clinic review tool, if applicable. No additional management support is needed unless otherwise documented below in the visit note/SLS  

## 2016-06-09 ENCOUNTER — Ambulatory Visit (INDEPENDENT_AMBULATORY_CARE_PROVIDER_SITE_OTHER): Payer: BLUE CROSS/BLUE SHIELD | Admitting: Physician Assistant

## 2016-06-09 VITALS — BP 135/79 | HR 60

## 2016-06-09 DIAGNOSIS — R001 Bradycardia, unspecified: Secondary | ICD-10-CM | POA: Diagnosis not present

## 2016-06-09 NOTE — Patient Instructions (Signed)
Per Malva Coganody Martin, PA-C: Continue current medication regimen and follow-up with PCP in 2 months.

## 2016-06-09 NOTE — Progress Notes (Signed)
Pre visit review using our clinic review tool, if applicable. No additional management support is needed unless otherwise documented below in the visit note.  Patient presents in office for blood pressure check. Reviewed medication with the patient. Today's reading was BP 135/79 & P 60.  Per Malva Coganody Martin, PA-C: Continue current medication regimen and follow-up with PCP in 2 months.  Informed patient of the provider's instructions. He verbalized understanding and did not have any questions or concerns prior to leaving nurse visit.  Next appointment scheduled for 08/15/16 at 3:45 PM.

## 2016-06-10 NOTE — Progress Notes (Signed)
Reviewed. Plan is as described on RN note.

## 2016-06-19 ENCOUNTER — Encounter: Payer: Self-pay | Admitting: Cardiology

## 2016-06-29 ENCOUNTER — Encounter: Payer: Self-pay | Admitting: Cardiology

## 2016-07-03 ENCOUNTER — Ambulatory Visit: Payer: BLUE CROSS/BLUE SHIELD | Admitting: Cardiology

## 2016-07-10 ENCOUNTER — Ambulatory Visit (INDEPENDENT_AMBULATORY_CARE_PROVIDER_SITE_OTHER): Payer: BLUE CROSS/BLUE SHIELD | Admitting: Physician Assistant

## 2016-07-10 ENCOUNTER — Ambulatory Visit: Payer: BLUE CROSS/BLUE SHIELD | Admitting: Physician Assistant

## 2016-07-10 ENCOUNTER — Encounter: Payer: Self-pay | Admitting: Physician Assistant

## 2016-07-10 VITALS — BP 116/68 | HR 58 | Temp 98.0°F | Resp 22 | Ht 75.0 in | Wt 313.5 lb

## 2016-07-10 DIAGNOSIS — J019 Acute sinusitis, unspecified: Secondary | ICD-10-CM

## 2016-07-10 DIAGNOSIS — B9689 Other specified bacterial agents as the cause of diseases classified elsewhere: Secondary | ICD-10-CM

## 2016-07-10 MED ORDER — BENZONATATE 100 MG PO CAPS: 100.0000 mg | ORAL_CAPSULE | Freq: Three times a day (TID) | ORAL | 0 refills | Status: DC | PRN

## 2016-07-10 MED ORDER — ALBUTEROL SULFATE HFA 108 (90 BASE) MCG/ACT IN AERS: 2.0000 | INHALATION_SPRAY | Freq: Four times a day (QID) | RESPIRATORY_TRACT | 0 refills | Status: DC | PRN

## 2016-07-10 MED ORDER — AMOXICILLIN-POT CLAVULANATE 875-125 MG PO TABS: 1.0000 | ORAL_TABLET | Freq: Two times a day (BID) | ORAL | 0 refills | Status: DC

## 2016-07-10 NOTE — Progress Notes (Signed)
Patient presents to clinic today c/o > 1 week of chest congestion, head congestion with sinus pressure and sinus pain, ear pain and chills. Denies fever, recent travel or sick contact. Notes mild chest congestion with cough productive of thick yellow sputum. Denies chest pain or SOB.  Past Medical History:  Diagnosis Date  . Allergic rhinitis   . Arthritis   . Broken leg   . Carbon dioxide poisoning   . Colon polyps   . Environmental allergies   . GERD (gastroesophageal reflux disease)   . History of chicken pox   . Kidney stones   . Seasonal allergies   . Tremors of nervous system     Current Outpatient Prescriptions on File Prior to Visit  Medication Sig Dispense Refill  . Ascorbic Acid (VITAMIN C) 1000 MG tablet Take 1,000 mg by mouth daily.    Marland Kitchen. aspirin 81 MG tablet Take 81 mg by mouth daily.    Marland Kitchen. azelastine (ASTELIN) 0.1 % nasal spray Place 2 sprays into both nostrils 2 (two) times daily. Use in each nostril as directed 30 mL 12  . Cetirizine HCl 10 MG CAPS Take 10 mg by mouth daily.    . Cholecalciferol (VITAMIN D-3) 1000 UNITS CAPS Take 1,000 Units by mouth daily.    . fluticasone (FLONASE) 50 MCG/ACT nasal spray Place into both nostrils daily.    Marland Kitchen. gabapentin (NEURONTIN) 800 MG tablet Take 800 mg by mouth 4 (four) times daily.    Marland Kitchen. ibuprofen (ADVIL,MOTRIN) 200 MG tablet Take 200 mg by mouth every 6 (six) hours as needed.    . primidone (MYSOLINE) 50 MG tablet Take 150 mg by mouth 2 (two) times daily.    . tamsulosin (FLOMAX) 0.4 MG CAPS capsule Take 1 capsule (0.4 mg total) by mouth at bedtime. 30 capsule 5  . vitamin B-12 (CYANOCOBALAMIN) 1000 MCG tablet Take 1,000 mcg by mouth daily.     No current facility-administered medications on file prior to visit.     Allergies  Allergen Reactions  . Codeine Nausea And Vomiting    All Codeine Related Drugs     Family History  Problem Relation Age of Onset  . Heart disease Mother   . Congestive Heart Failure Mother [id number]      Deceased  . Alcoholism Father     Living  . Arthritis Father   . Diabetes Maternal Aunt   . Cancer Other     PGGM  . Breast cancer Maternal Aunt   . Lung cancer Maternal Uncle   . Heart disease Brother   . Heart attack Brother   . Congestive Heart Failure Brother 47    Deceased  . Stroke Maternal Aunt   . Emphysema Brother     #2  . Arthritis Sister     #1  . Allergies Daughter   . Kidney Stones Daughter   . Gallbladder disease Daughter   . Migraines Daughter     Social History   Social History  . Marital status: Single    Spouse name: N/A  . Number of children: 3  . Years of education: N/A   Occupational History  . [employer]    Social History Main Topics  . Smoking status: Former Smoker    Packs/day: 2.00    Years: 30.00    Types: Cigarettes    Quit date: 11/20/2000  . Smokeless tobacco: Never Used  . Alcohol use None  . Drug use: Unknown  . Sexual activity: Not Asked  Other Topics Concern  . None   Social History Narrative  . None    Review of Systems - See HPI.  All other ROS are negative.  BP 116/68 (BP Location: Right Arm, Patient Position: Sitting, Cuff Size: Large)   Pulse (!) 58   Temp 98 F (36.7 C) (Oral)   Resp (!) 22   Ht 6\' 3"  (1.905 m)   Wt (!) 313 lb 8 oz (142.2 kg)   SpO2 95%   BMI 39.18 kg/m   Physical Exam  Constitutional: He is well-developed, well-nourished, and in no distress.  HENT:  Head: Normocephalic and atraumatic.  Right Ear: Tympanic membrane normal.  Left Ear: Tympanic membrane normal.  Nose: Mucosal edema and rhinorrhea present. Right sinus exhibits maxillary sinus tenderness. Left sinus exhibits maxillary sinus tenderness.  Mouth/Throat: Uvula is midline, oropharynx is clear and moist and mucous membranes are normal.  Eyes: Conjunctivae are normal.  Neck: Neck supple.  Cardiovascular: Normal rate, regular rhythm, normal heart sounds and intact distal pulses.   Pulmonary/Chest: Effort normal and breath  sounds normal. No respiratory distress. He has no wheezes. He has no rales. He exhibits no tenderness.  Skin: Skin is warm and dry. No rash noted.  Psychiatric: Affect normal.  Vitals reviewed.   Recent Results (from the past 2160 hour(s))  CBC     Status: Abnormal   Collection Time: 05/30/16  8:56 AM  Result Value Ref Range   WBC 5.9 4.0 - 10.5 K/uL   RBC 4.54 4.22 - 5.81 Mil/uL   Platelets 148.0 (L) 150.0 - 400.0 K/uL   Hemoglobin 15.0 13.0 - 17.0 g/dL   HCT 16.1 09.6 - 04.5 %   MCV 97.6 78.0 - 100.0 fl   MCHC 33.9 30.0 - 36.0 g/dL   RDW 40.9 81.1 - 91.4 %  Comprehensive metabolic panel     Status: Abnormal   Collection Time: 05/30/16  8:56 AM  Result Value Ref Range   Sodium 139 135 - 145 mEq/L   Potassium 4.6 3.5 - 5.1 mEq/L   Chloride 102 96 - 112 mEq/L   CO2 33 (H) 19 - 32 mEq/L   Glucose, Bld 94 70 - 99 mg/dL   BUN 17 6 - 23 mg/dL   Creatinine, Ser 7.82 0.40 - 1.50 mg/dL   Total Bilirubin 0.5 0.2 - 1.2 mg/dL   Alkaline Phosphatase 53 39 - 117 U/L   AST 26 0 - 37 U/L   ALT 22 0 - 53 U/L   Total Protein 7.0 6.0 - 8.3 g/dL   Albumin 4.2 3.5 - 5.2 g/dL   Calcium 9.4 8.4 - 95.6 mg/dL   GFR 21.30 >86.57 mL/min  Hemoglobin A1c     Status: None   Collection Time: 05/30/16  8:56 AM  Result Value Ref Range   Hgb A1c MFr Bld 5.4 4.6 - 6.5 %    Comment: Glycemic Control Guidelines for People with Diabetes:Non Diabetic:  <6%Goal of Therapy: <7%Additional Action Suggested:  >8%   Lipid panel     Status: Abnormal   Collection Time: 05/30/16  8:56 AM  Result Value Ref Range   Cholesterol 173 0 - 200 mg/dL    Comment: ATP III Classification       Desirable:  < 200 mg/dL               Borderline High:  200 - 239 mg/dL          High:  > = 846 mg/dL  Triglycerides 100.0 0.0 - 149.0 mg/dL    Comment: Normal:  <657<150 mg/dLBorderline High:  150 - 199 mg/dL   HDL 84.6932.70 (L) >62.95>39.00 mg/dL   VLDL 28.420.0 0.0 - 13.240.0 mg/dL   LDL Cholesterol 440121 (H) 0 - 99 mg/dL   Total CHOL/HDL Ratio 5      Comment:                Men          Women1/2 Average Risk     3.4          3.3Average Risk          5.0          4.42X Average Risk          9.6          7.13X Average Risk          15.0          11.0                       NonHDL 140.59     Comment: NOTE:  Non-HDL goal should be 30 mg/dL higher than patient's LDL goal (i.e. LDL goal of < 70 mg/dL, would have non-HDL goal of < 100 mg/dL)  PSA     Status: None   Collection Time: 05/30/16  8:56 AM  Result Value Ref Range   PSA 1.38 0.10 - 4.00 ng/mL  TSH     Status: None   Collection Time: 05/30/16  8:56 AM  Result Value Ref Range   TSH 2.83 0.35 - 4.50 uIU/mL  Urinalysis, Routine w reflex microscopic (not [facility])     Status: Abnormal   Collection Time: 05/30/16  8:56 AM  Result Value Ref Range   Color, Urine YELLOW Yellow;Lt. Yellow   APPearance CLEAR Clear   Specific Gravity, Urine 1.020 1.000 - 1.030   pH 6.0 5.0 - 8.0   Total Protein, Urine NEGATIVE Negative   Urine Glucose NEGATIVE Negative   Ketones, ur NEGATIVE Negative   Bilirubin Urine NEGATIVE Negative   Hgb urine dipstick NEGATIVE Negative   Urobilinogen, UA 0.2 0.0 - 1.0   Leukocytes, UA NEGATIVE Negative   Nitrite NEGATIVE Negative   WBC, UA 0-2/hpf 0-2/hpf   RBC / HPF none seen 0-2/hpf   Mucus, UA Presence of (A) None  Troponin I     Status: None   Collection Time: 05/30/16  8:56 AM  Result Value Ref Range   TNIDX 0.02 0.00 - 0.06 ug/l    Assessment/Plan: Rx Augmentin.  Increase fluids.  Rest.  Saline nasal spray.  Probiotic.  Mucinex as directed.  Humidifier in bedroom. Tessalon and Albuterol per orders.  Call or return to clinic if symptoms are not improving.   Piedad ClimesMartin, Omkar Cody, PA-C

## 2016-07-10 NOTE — Patient Instructions (Signed)
Please take antibiotic as directed.  Increase fluid intake.  Use Saline nasal spray.  Take a daily multivitamin. Tessalon as directed for cough.  You can use the Albuterol inhaler as directed for Place a humidifier in the bedroom.  Please call or return clinic if symptoms are not improving.  Sinusitis Sinusitis is redness, soreness, and swelling (inflammation) of the paranasal sinuses. Paranasal sinuses are air pockets within the bones of your face (beneath the eyes, the middle of the forehead, or above the eyes). In healthy paranasal sinuses, mucus is able to drain out, and air is able to circulate through them by way of your nose. However, when your paranasal sinuses are inflamed, mucus and air can become trapped. This can allow bacteria and other germs to grow and cause infection. Sinusitis can develop quickly and last only a short time (acute) or continue over a long period (chronic). Sinusitis that lasts for more than 12 weeks is considered chronic.  CAUSES  Causes of sinusitis include:  Allergies.  Structural abnormalities, such as displacement of the cartilage that separates your nostrils (deviated septum), which can decrease the air flow through your nose and sinuses and affect sinus drainage.  Functional abnormalities, such as when the small hairs (cilia) that line your sinuses and help remove mucus do not work properly or are not present. SYMPTOMS  Symptoms of acute and chronic sinusitis are the same. The primary symptoms are pain and pressure around the affected sinuses. Other symptoms include:  Upper toothache.  Earache.  Headache.  Bad breath.  Decreased sense of smell and taste.  A cough, which worsens when you are lying flat.  Fatigue.  Fever.  Thick drainage from your nose, which often is green and may contain pus (purulent).  Swelling and warmth over the affected sinuses. DIAGNOSIS  Your caregiver will perform a physical exam. During the exam, your caregiver  may:  Look in your nose for signs of abnormal growths in your nostrils (nasal polyps).  Tap over the affected sinus to check for signs of infection.  View the inside of your sinuses (endoscopy) with a special imaging device with a light attached (endoscope), which is inserted into your sinuses. If your caregiver suspects that you have chronic sinusitis, one or more of the following tests may be recommended:  Allergy tests.  Nasal culture A sample of mucus is taken from your nose and sent to a lab and screened for bacteria.  Nasal cytology A sample of mucus is taken from your nose and examined by your caregiver to determine if your sinusitis is related to an allergy. TREATMENT  Most cases of acute sinusitis are related to a viral infection and will resolve on their own within 10 days. Sometimes medicines are prescribed to help relieve symptoms (pain medicine, decongestants, nasal steroid sprays, or saline sprays).  However, for sinusitis related to a bacterial infection, your caregiver will prescribe antibiotic medicines. These are medicines that will help kill the bacteria causing the infection.  Rarely, sinusitis is caused by a fungal infection. In theses cases, your caregiver will prescribe antifungal medicine. For some cases of chronic sinusitis, surgery is needed. Generally, these are cases in which sinusitis recurs more than 3 times per year, despite other treatments. HOME CARE INSTRUCTIONS   Drink plenty of water. Water helps thin the mucus so your sinuses can drain more easily.  Use a humidifier.  Inhale steam 3 to 4 times a day (for example, sit in the bathroom with the shower running).  Apply a warm, moist washcloth to your face 3 to 4 times a day, or as directed by your caregiver.  Use saline nasal sprays to help moisten and clean your sinuses.  Take over-the-counter or prescription medicines for pain, discomfort, or fever only as directed by your caregiver. SEEK IMMEDIATE  MEDICAL CARE IF:  You have increasing pain or severe headaches.  You have nausea, vomiting, or drowsiness.  You have swelling around your face.  You have vision problems.  You have a stiff neck.  You have difficulty breathing. MAKE SURE YOU:   Understand these instructions.  Will watch your condition.  Will get help right away if you are not doing well or get worse. Document Released: 11/06/2005 Document Revised: 01/29/2012 Document Reviewed: 11/21/2011 [facility] Patient Information 2014 [place], [place].   Metered Dose Inhaler (No Spacer Used) Inhaled medicines are the basis of treatment for asthma and other breathing problems. Inhaled medicine can only be effective if used properly. Good technique assures that the medicine reaches the lungs. Metered dose inhalers (MDIs) are used to deliver a variety of inhaled medicines. These include quick relief or rescue medicines (such as bronchodilators) and controller medicines (such as corticosteroids). The medicine is delivered by pushing down on a metal canister to release a set amount of spray. If you are using different kinds of inhalers, use your quick relief medicine to open the airways 10-15 minutes before using a steroid, if instructed to do so by your health care provider. If you are unsure which inhalers to use and the order of using them, ask your health care provider, nurse, or respiratory therapist. HOW TO USE THE INHALER 1. Remove the cap from the inhaler. 2. If you are using the inhaler for the first time, you will need to prime it. Shake the inhaler for 5 seconds and release four puffs into the air, away from your face. Ask your health care provider or pharmacist if you have questions about priming your inhaler. 3. Shake the inhaler for 5 seconds before each breath in (inhalation). 4. Position the inhaler so that the top of the canister faces up. 5. Put your index finger on the top of the medicine canister. Your thumb supports  the bottom of the inhaler. 6. Open your mouth. 7. Either place the inhaler between your teeth and place your lips tightly around the mouthpiece, or hold the inhaler 1-2 inches away from your open mouth. If you are unsure of which technique to use, ask your health care provider. 8. Breathe out (exhale) normally and as completely as possible. 9. Press the canister down with the index finger to release the medicine. 10. At the same time as the canister is pressed, inhale deeply and slowly until your lungs are completely filled. This should take 4-6 seconds. Keep your tongue down. 11. Hold the medicine in your lungs for 5-10 seconds (10 seconds is best). This helps the medicine get into the small airways of your lungs. 12. Breathe out slowly, through pursed lips. Whistling is an example of pursed lips. 13. Wait at least 1 minute between puffs. Continue with the above steps until you have taken the number of puffs your health care provider has ordered. Do not use the inhaler more than your health care provider directs you to. 14. Replace the cap on the inhaler. 15. Follow the directions from your health care provider or the inhaler insert for cleaning the inhaler. If you are using a steroid inhaler, after your last puff, rinse your  mouth with water, gargle, and spit out the water. Do not swallow the water. AVOID:  Inhaling before or after starting the spray of medicine. It takes practice to coordinate your breathing with triggering the spray.  Inhaling through the nose (rather than the mouth) when triggering the spray. HOW TO DETERMINE IF YOUR INHALER IS FULL OR NEARLY EMPTY You cannot know when an inhaler is empty by shaking it. Some inhalers are now being made with dose counters. Ask your health care provider for a prescription that has a dose counter if you feel you need that extra help. If your inhaler does not have a counter, ask your health care provider to help you determine the date you need to  refill your inhaler. Write the refill date on a calendar or your inhaler canister. Refill your inhaler 7-10 days before it runs out. Be sure to keep an adequate supply of medicine. This includes making sure it has not expired, and making sure you have a spare inhaler. SEEK MEDICAL CARE IF:  Symptoms are only partially relieved with your inhaler.  You are having trouble using your inhaler.  You experience an increase in phlegm. SEEK IMMEDIATE MEDICAL CARE IF:  You feel little or no relief with your inhalers. You are still wheezing and feeling shortness of breath, tightness in your chest, or both.  You have dizziness, headaches, or a fast heart rate.  You have chills, fever, or night sweats.  There is a noticeable increase in phlegm production, or there is blood in the phlegm. MAKE SURE YOU:  Understand these instructions.  Will watch your condition.  Will get help right away if you are not doing well or get worse.   This information is not intended to replace advice given to you by your health care provider. Make sure you discuss any questions you have with your health care provider.   Document Released: 09/03/2007 Document Revised: 11/27/2014 Document Reviewed: 04/24/2013 [employer] Interactive Patient Education [employer]2016 [employer]

## 2016-07-12 NOTE — Progress Notes (Signed)
Cardiology Office Note    Date:  07/13/2016   ID:  Warren Baker, DOB 06/02/1951, MRN [id number]  PCP:  Piedad ClimesMartin, Jenesis Cody, PA-C  Cardiologist:  Armanda Magicraci Abrahan Fulmore, MD   No chief complaint on file.   History of Present Illness:  Warren Baker is a 65 y.o. male with a history of GERD, presents today for evaluation of bradycardia.  He was seen 05/30/2016 by his PCP for routine PE and was found to have a heart rate of 44bpm.  He was on Propranolol at the time by Neurology for a tremor and his BB was decreased.  TSH was normal.  12 lead EKG showed a T wave abnormality and he is now referred for further workup due to CRFs including obesity, remote tobacco abuse ad family history of CAD.  He says that he feels tired all the time.  He wakes up feeling tired and is sleepy throughout the day but cant take a nap due to his work.  He has been told that he snores loudly and stops breathing in his sleep.  He has never been worked up for OSA.  He has some problems with DOE.  This had gotten worse with time.  He denies any chest pain or pressure with exertion.  He denies any palpitations, dizziness or syncope.     Past Medical History:  Diagnosis Date  . Allergic rhinitis   . Arthritis   . Broken leg   . Carbon dioxide poisoning   . Colon polyps   . Environmental allergies   . Excessive daytime sleepiness 07/13/2016  . GERD (gastroesophageal reflux disease)   . History of chicken pox   . Kidney stones   . Obesity (BMI 30-39.9) 07/13/2016  . Seasonal allergies   . Snoring 07/13/2016  . Tremors of nervous system     Past Surgical History:  Procedure Laterality Date  . TONSILLECTOMY AND ADENOIDECTOMY    . WISDOM TOOTH EXTRACTION      Current Medications: Outpatient Medications Prior to Visit  Medication Sig Dispense Refill  . albuterol (PROVENTIL HFA;VENTOLIN HFA) 108 (90 Base) MCG/ACT inhaler Inhale 2 puffs into the lungs every 6 (six) hours as needed for wheezing or shortness of breath. 1  Inhaler 0  . amoxicillin-clavulanate (AUGMENTIN) 875-125 MG tablet Take 1 tablet by mouth 2 (two) times daily. 14 tablet 0  . Ascorbic Acid (VITAMIN C) 1000 MG tablet Take 1,000 mg by mouth daily.    Marland Kitchen. aspirin 81 MG tablet Take 81 mg by mouth daily.    Marland Kitchen. azelastine (ASTELIN) 0.1 % nasal spray Place 2 sprays into both nostrils 2 (two) times daily. Use in each nostril as directed 30 mL 12  . benzonatate (TESSALON) 100 MG capsule Take 1 capsule (100 mg total) by mouth 3 (three) times daily as needed. 30 capsule 0  . Cetirizine HCl 10 MG CAPS Take 10 mg by mouth daily.    . Cholecalciferol (VITAMIN D-3) 1000 UNITS CAPS Take 1,000 Units by mouth daily.    . fluticasone (FLONASE) 50 MCG/ACT nasal spray Place into both nostrils daily.    Marland Kitchen. gabapentin (NEURONTIN) 800 MG tablet Take 800 mg by mouth 4 (four) times daily.    Marland Kitchen. ibuprofen (ADVIL,MOTRIN) 200 MG tablet Take 200 mg by mouth every 6 (six) hours as needed.    . primidone (MYSOLINE) 50 MG tablet Take 150 mg by mouth 2 (two) times daily.    . propranolol (INDERAL) 40 MG tablet Take 20 mg by mouth 2 (  two) times daily.    . tamsulosin (FLOMAX) 0.4 MG CAPS capsule Take 1 capsule (0.4 mg total) by mouth at bedtime. 30 capsule 5  . vitamin B-12 (CYANOCOBALAMIN) 1000 MCG tablet Take 1,000 mcg by mouth daily.     No facility-administered medications prior to visit.      Allergies:   Codeine   Social History   Social History  . Marital status: Single    Spouse name: N/A  . Number of children: 3  . Years of education: N/A   Occupational History  . [employer] Heart Technician    Social History Main Topics  . Smoking status: Former Smoker    Packs/day: 2.00    Years: 30.00    Types: Cigarettes    Quit date: 11/20/2000  . Smokeless tobacco: Never Used  . Alcohol use None  . Drug use: Unknown  . Sexual activity: Not Asked   Other Topics Concern  . None   Social History Narrative  . None     Family History:  The patient's family history includes  Alcoholism in his father; Allergies in his daughter; Arthritis in his father and sister; Breast cancer in his maternal aunt; Cancer in his other; Congestive Heart Failure (age of onset: 53) in his brother; Congestive Heart Failure (age of onset: 18) in his mother; Diabetes in his maternal aunt; Emphysema in his brother; Gallbladder disease in his daughter; Heart attack in his brother; Heart disease in his brother and mother; Kidney Stones in his daughter; Lung cancer in his maternal uncle; Migraines in his daughter; Stroke in his maternal aunt.   ROS:   Please see the history of present illness.    ROS All other systems reviewed and are negative.   PHYSICAL EXAM:   VS:  BP 134/68   Pulse (!) 56   Ht 6\' 3"  (1.905 m)   Wt (!) 312 lb (141.5 kg)   BMI 39.00 kg/m    GEN: Well nourished, well developed, in no acute distress  HEENT: normal  Neck: no JVD, carotid bruits, or masses Cardiac: RRR; no murmurs, rubs, or gallops,no edema.  Intact distal pulses bilaterally.  Respiratory:  Diffuse expiratory wheezes GI: soft, nontender, nondistended, + BS MS: no deformity or atrophy  Skin: warm and dry, no rash Neuro:  Alert and Oriented x 3, Strength and sensation are intact Psych: euthymic mood, full affect  Wt Readings from Last 3 Encounters:  07/13/16 (!) 312 lb (141.5 kg)  07/10/16 (!) 313 lb 8 oz (142.2 kg)  06/02/16 (!) 305 lb 8 oz (138.6 kg)      Studies/Labs Reviewed:   EKG:  EKG is not ordered today.   Recent Labs: 05/30/2016: ALT 22; BUN 17; Creatinine, Ser 0.94; Hemoglobin 15.0; Platelets 148.0; Potassium 4.6; Sodium 139; TSH 2.83   Lipid Panel    Component Value Date/Time   CHOL 173 05/30/2016 0856   TRIG 100.0 05/30/2016 0856   HDL 32.70 (L) 05/30/2016 0856   CHOLHDL 5 05/30/2016 0856   VLDL 20.0 05/30/2016 0856   LDLCALC 121 (H) 05/30/2016 0856    Additional studies/ records that were reviewed today include:  Office notes from PCP    ASSESSMENT:    1.  Bradycardia   2. DOE (dyspnea on exertion)   3. Excessive daytime sleepiness   4. Snoring   5. Obesity (BMI 30-39.9)      PLAN:  In order of problems listed above:  1. Bradycardia - his HR is still in the 50's and  he has some fatigue.  I cannot tell whether his fatigue is due to ongoing bradycardia or sleep apnea.  I will get a 24 hour Holter to assess average heart raet. 2. DOE which may be due to obesity, bradycardia but also need to consider CAD given his family history and history of remote tobacco.  His EKG is abnormal with diffuse ST abnormality.  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 Order lexiscan myoview and 2D echo.  3. Excessive daytime sleepiness - given his snoring and body habitus I suspect he has OSA.  I will get a split night PSG. 4. Snoring see above #3 5. Obesity - he gets no aerobic exercise - I have encouraged him to get into a routine exercise program and cut back on carbs and portions.     Medication Adjustments/Labs and Tests Ordered: Current medicines are reviewed at length with the patient today.  Concerns regarding medicines are outlined above.  Medication changes, Labs and Tests ordered today are listed in the Patient Instructions below.  There are no Patient Instructions on file for this visit.   Signed, Armanda Magicraci Zoye Chandra, MD  07/13/2016 11:50 AM    [facility] [address], [place], [place]  [address] Phone: [phone]; Fax: [phone]

## 2016-07-13 ENCOUNTER — Encounter (INDEPENDENT_AMBULATORY_CARE_PROVIDER_SITE_OTHER): Payer: Self-pay

## 2016-07-13 ENCOUNTER — Encounter: Payer: Self-pay | Admitting: Cardiology

## 2016-07-13 ENCOUNTER — Ambulatory Visit (INDEPENDENT_AMBULATORY_CARE_PROVIDER_SITE_OTHER): Payer: BLUE CROSS/BLUE SHIELD | Admitting: Cardiology

## 2016-07-13 VITALS — BP 134/68 | HR 56 | Ht 75.0 in | Wt 312.0 lb

## 2016-07-13 DIAGNOSIS — R001 Bradycardia, unspecified: Secondary | ICD-10-CM

## 2016-07-13 DIAGNOSIS — R0609 Other forms of dyspnea: Secondary | ICD-10-CM | POA: Insufficient documentation

## 2016-07-13 DIAGNOSIS — E669 Obesity, unspecified: Secondary | ICD-10-CM

## 2016-07-13 DIAGNOSIS — G4719 Other hypersomnia: Secondary | ICD-10-CM

## 2016-07-13 DIAGNOSIS — R0683 Snoring: Secondary | ICD-10-CM | POA: Insufficient documentation

## 2016-07-13 HISTORY — DX: Other hypersomnia: G47.19

## 2016-07-13 NOTE — Patient Instructions (Signed)
Medication Instructions:  Your physician recommends that you continue on your current medications as directed. Please refer to the Current Medication list given to you today.   Labwork: None  Testing/Procedures: Your physician has requested that you have an echocardiogram. Echocardiography is a painless test that uses sound waves to create images of your heart. It provides your doctor with information about the size and shape of your heart and how well your heart's chambers and valves are working. This procedure takes approximately one hour. There are no restrictions for this procedure.  Your physician has requested that you have a lexiscan myoview. For further information please visit [email or web address]. Please follow instruction sheet, as given.  Your physician has recommended that you wear a holter monitor. Holter monitors are medical devices that record the heart's electrical activity. Doctors most often use these monitors to diagnose arrhythmias. Arrhythmias are problems with the speed or rhythm of the heartbeat. The monitor is a small, portable device. You can wear one while you do your normal daily activities. This is usually used to diagnose what is causing palpitations/syncope (passing out).  Your physician has recommended that you have a sleep study. This test records several body functions during sleep, including: brain activity, eye movement, oxygen and carbon dioxide blood levels, heart rate and rhythm, breathing rate and rhythm, the flow of air through your mouth and nose, snoring, body muscle movements, and chest and belly movement.  Follow-Up: Your physician recommends that you schedule a follow-up appointment AS NEEDED with Dr. Mayford Knifeurner pending study results.  Any Other Special Instructions Will Be Listed Below (If Applicable).     If you need a refill on your cardiac medications before your next appointment, please call your pharmacy.

## 2016-07-18 ENCOUNTER — Telehealth: Payer: Self-pay | Admitting: *Deleted

## 2016-07-18 ENCOUNTER — Telehealth: Payer: Self-pay | Admitting: Physician Assistant

## 2016-07-18 MED ORDER — DOXYCYCLINE HYCLATE 100 MG PO TABS: 100.0000 mg | ORAL_TABLET | Freq: Two times a day (BID) | ORAL | 0 refills | Status: DC

## 2016-07-18 NOTE — Telephone Encounter (Signed)
Assessment/Plan: Rx Augmentin.  Increase fluids.  Rest.  Saline nasal spray.  Probiotic.  Mucinex as directed.  Humidifier in bedroom. Tessalon and Albuterol per orders.  Call or return to clinic if symptoms are not improving. Please Advise/SLS 08/29

## 2016-07-18 NOTE — Telephone Encounter (Signed)
The reason for inpatient lab is due to bradycarida

## 2016-07-18 NOTE — Telephone Encounter (Signed)
Ok to send in Doxycycline 100 mg BID x 7 days  

## 2016-07-18 NOTE — Telephone Encounter (Signed)
Pt called in to make PCP aware that he has completed the antibiotic and is still experiencing the infection. Pt would like to be advised on if he needs to follow up or should he receive another Rx?   Please advise.      CB: [phone]

## 2016-07-18 NOTE — Telephone Encounter (Signed)
[employer] states that patient does not meet the criteria for a lab study.   They have given approval for a home sleep study if the physician will allow.   I am routing to Dr. Mayford Knifeurner for approval. Patient is aware that I am waiting on approval.

## 2016-07-18 NOTE — Telephone Encounter (Signed)
Patient informed, understood & agreed; new Rx to pharmacy/SLS 08/29

## 2016-07-20 ENCOUNTER — Ambulatory Visit (INDEPENDENT_AMBULATORY_CARE_PROVIDER_SITE_OTHER): Payer: BLUE CROSS/BLUE SHIELD

## 2016-07-20 DIAGNOSIS — R001 Bradycardia, unspecified: Secondary | ICD-10-CM | POA: Diagnosis not present

## 2016-07-21 NOTE — Telephone Encounter (Signed)
OK to proceed with home sleep study

## 2016-07-21 NOTE — Telephone Encounter (Signed)
Information submitted to [facility].

## 2016-07-21 NOTE — Telephone Encounter (Signed)
Spoke with [employer] again today and they said the only way to try for an approval for lab is if the provider calls [phone] select option 2.  She said you would need the member ID number which is [id number].

## 2016-07-25 ENCOUNTER — Telehealth (HOSPITAL_COMMUNITY): Payer: Self-pay | Admitting: *Deleted

## 2016-07-25 NOTE — Telephone Encounter (Signed)
Left message on voicemail per DPR in reference to upcoming appointment scheduled on 07/27/16 with detailed instructions given per Myocardial Perfusion Study Information Sheet for the test. LM to arrive 15 minutes early, and that it is imperative to arrive on time for appointment to keep from having the test rescheduled. If you need to cancel or reschedule your appointment, please call the office within 24 hours of your appointment. Failure to do so may result in a cancellation of your appointment, and a $50 no show fee. Phone number given for call back for any questions.  Warren Baker    

## 2016-07-27 ENCOUNTER — Ambulatory Visit (HOSPITAL_BASED_OUTPATIENT_CLINIC_OR_DEPARTMENT_OTHER): Payer: BLUE CROSS/BLUE SHIELD

## 2016-07-27 ENCOUNTER — Ambulatory Visit (HOSPITAL_COMMUNITY): Payer: BLUE CROSS/BLUE SHIELD | Attending: Cardiology

## 2016-07-27 ENCOUNTER — Other Ambulatory Visit: Payer: Self-pay

## 2016-07-27 DIAGNOSIS — E669 Obesity, unspecified: Secondary | ICD-10-CM | POA: Insufficient documentation

## 2016-07-27 DIAGNOSIS — Z87891 Personal history of nicotine dependence: Secondary | ICD-10-CM | POA: Insufficient documentation

## 2016-07-27 DIAGNOSIS — R001 Bradycardia, unspecified: Secondary | ICD-10-CM | POA: Diagnosis not present

## 2016-07-27 DIAGNOSIS — R0609 Other forms of dyspnea: Secondary | ICD-10-CM | POA: Diagnosis not present

## 2016-07-27 DIAGNOSIS — Z6839 Body mass index (BMI) 39.0-39.9, adult: Secondary | ICD-10-CM | POA: Insufficient documentation

## 2016-07-27 MED ORDER — TECHNETIUM TC 99M TETROFOSMIN IV KIT
32.3000 | PACK | Freq: Once | INTRAVENOUS | Status: AC | PRN
  Administered 2016-07-27: 32.3 via INTRAVENOUS
  Filled 2016-07-27: qty 32

## 2016-07-27 MED ORDER — PERFLUTREN LIPID MICROSPHERE
1.0000 mL | INTRAVENOUS | Status: AC | PRN
  Administered 2016-07-27: 2 mL via INTRAVENOUS

## 2016-07-27 MED ORDER — REGADENOSON 0.4 MG/5ML IV SOLN
0.4000 mg | Freq: Once | INTRAVENOUS | Status: AC
  Administered 2016-07-27: 0.4 mg via INTRAVENOUS

## 2016-07-28 ENCOUNTER — Ambulatory Visit (HOSPITAL_COMMUNITY): Payer: BLUE CROSS/BLUE SHIELD | Attending: Cardiovascular Disease

## 2016-07-28 LAB — MYOCARDIAL PERFUSION IMAGING
CSEPPHR: 69 {beats}/min
LHR: 0.34
LVDIAVOL: 204 mL (ref 62–150)
LVSYSVOL: 98 mL
Rest HR: 48 {beats}/min
SDS: 1
SRS: 6
SSS: 7
TID: 1.03

## 2016-07-28 MED ORDER — TECHNETIUM TC 99M TETROFOSMIN IV KIT
31.8000 | PACK | Freq: Once | INTRAVENOUS | Status: AC | PRN
  Administered 2016-07-28: 31.8 via INTRAVENOUS
  Filled 2016-07-28: qty 32

## 2016-08-01 ENCOUNTER — Encounter: Payer: Self-pay | Admitting: Cardiology

## 2016-08-01 ENCOUNTER — Telehealth: Payer: Self-pay | Admitting: Cardiology

## 2016-08-01 DIAGNOSIS — Z01812 Encounter for preprocedural laboratory examination: Secondary | ICD-10-CM

## 2016-08-01 DIAGNOSIS — I422 Other hypertrophic cardiomyopathy: Secondary | ICD-10-CM

## 2016-08-01 NOTE — Telephone Encounter (Signed)
Mr. Letha CapeHackaday is returning a call

## 2016-08-01 NOTE — Telephone Encounter (Signed)
-----   Message from Quintella Reichertraci R Turner, MD sent at 07/28/2016  7:20 PM EDT ----- Please let patient know that stress test was fine

## 2016-08-01 NOTE — Telephone Encounter (Signed)
Informed patient of stress test, monitor, and ECHO results and verbal understanding expressed.  He understands monitor results will be forwarded to Neurology and that he will be called with medication recommendations after speaking with Dr. Loleta ChanceHill.  Cardiac MRI and BMET ordered for scheduling. Patient agrees with treatment plan.

## 2016-08-01 NOTE — Telephone Encounter (Signed)
-----   Message from Quintella Reichertraci R Turner, MD sent at 07/28/2016 11:15 AM EDT ----- Bradycardia could be causing fatigue.  Would recommend weaning off BB if ok with his neurologist.  Since he was placed on propranolol for tremor by Neuro I would like him to check with them to find out if it is ok to stop the BB

## 2016-08-01 NOTE — Telephone Encounter (Signed)
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  Please get a cardiac MRI with gadolineum to assess further

## 2016-08-10 ENCOUNTER — Telehealth: Payer: Self-pay | Admitting: *Deleted

## 2016-08-10 NOTE — Telephone Encounter (Signed)
Per Dr. Norris Crossurner's notes pt is to decrease propranolol to (20 mg ) twice a day x 2 days than (10 mg ) daily x 2 days than stop due to bradycardia on holter monitor.  Pt is aware and agreeable to treatment plan.

## 2016-08-14 NOTE — Addendum Note (Signed)
Addended by: Gunnar FusiKEMP, Makaylee Spielberg A on: 08/14/2016 08:47 AM   Modules accepted: Orders

## 2016-08-14 NOTE — Telephone Encounter (Signed)
Med list updated

## 2016-08-15 ENCOUNTER — Ambulatory Visit (HOSPITAL_BASED_OUTPATIENT_CLINIC_OR_DEPARTMENT_OTHER)
Admission: RE | Admit: 2016-08-15 | Discharge: 2016-08-15 | Disposition: A | Discharge status: Home / Self Care | Payer: BLUE CROSS/BLUE SHIELD | Source: Ambulatory Visit | Attending: Physician Assistant | Admitting: Physician Assistant

## 2016-08-15 ENCOUNTER — Ambulatory Visit (INDEPENDENT_AMBULATORY_CARE_PROVIDER_SITE_OTHER): Payer: BLUE CROSS/BLUE SHIELD | Admitting: Physician Assistant

## 2016-08-15 ENCOUNTER — Encounter: Payer: Self-pay | Admitting: Physician Assistant

## 2016-08-15 VITALS — BP 144/71 | HR 57 | Temp 98.1°F | Resp 18 | Ht 75.0 in | Wt 316.0 lb

## 2016-08-15 DIAGNOSIS — B9689 Other specified bacterial agents as the cause of diseases classified elsewhere: Secondary | ICD-10-CM

## 2016-08-15 DIAGNOSIS — J208 Acute bronchitis due to other specified organisms: Secondary | ICD-10-CM

## 2016-08-15 DIAGNOSIS — J00 Acute nasopharyngitis [common cold]: Secondary | ICD-10-CM | POA: Diagnosis not present

## 2016-08-15 DIAGNOSIS — R05 Cough: Secondary | ICD-10-CM | POA: Diagnosis present

## 2016-08-15 MED ORDER — METHYLPREDNISOLONE 4 MG PO TBPK: ORAL_TABLET | ORAL | 0 refills | Status: DC

## 2016-08-15 MED ORDER — AZITHROMYCIN 250 MG PO TABS: ORAL_TABLET | ORAL | 0 refills | Status: DC

## 2016-08-15 MED ORDER — ALBUTEROL SULFATE HFA 108 (90 BASE) MCG/ACT IN AERS: 2.0000 | INHALATION_SPRAY | Freq: Four times a day (QID) | RESPIRATORY_TRACT | 0 refills | Status: DC | PRN

## 2016-08-15 NOTE — Patient Instructions (Addendum)
Please go downstairs for imaging. I will call with your results. Please continue albuterol as directed. Mucinex twice daily with lots of water.  Start the steroid pack as directed.  Keep checking home BP measures. Let me know if things are climbing above 140/90.  We will alter your regimen based on x-ray results.

## 2016-08-15 NOTE — Progress Notes (Signed)
Pre visit review using our clinic review tool, if applicable. No additional management support is needed unless otherwise documented below in the visit note/SLS  

## 2016-08-15 NOTE — Progress Notes (Signed)
Patient presents to clinic today c/o continued pnd with chest congestion and cough. Was recently on ABX for sinusitis. Endorses resolution of sinus pressure/pain but chest congestion and cough have worsened. Denies fever, chills. Notes SOB only with coughing. Denies chest pain or tenderness. Is using albuterol as directed. Only taking Mucinex on occasion. Notes he could hydrate better.  Past Medical History:  Diagnosis Date  . Allergic rhinitis   . Arthritis   . Broken leg   . Carbon dioxide poisoning   . Colon polyps   . Environmental allergies   . Excessive daytime sleepiness 07/13/2016  . GERD (gastroesophageal reflux disease)   . History of chicken pox   . Kidney stones   . Obesity (BMI 30-39.9) 07/13/2016  . Seasonal allergies   . Snoring 07/13/2016  . Tremors of nervous system     Current Outpatient Prescriptions on File Prior to Visit  Medication Sig Dispense Refill  . albuterol (PROVENTIL HFA;VENTOLIN HFA) 108 (90 Base) MCG/ACT inhaler Inhale 2 puffs into the lungs every 6 (six) hours as needed for wheezing or shortness of breath. 1 Inhaler 0  . Ascorbic Acid (VITAMIN C) 1000 MG tablet Take 1,000 mg by mouth daily.    Marland Kitchen aspirin 81 MG tablet Take 81 mg by mouth daily.    Marland Kitchen azelastine (ASTELIN) 0.1 % nasal spray Place 2 sprays into both nostrils 2 (two) times daily. Use in each nostril as directed 30 mL 12  . Cetirizine HCl 10 MG CAPS Take 10 mg by mouth daily.    . Cholecalciferol (VITAMIN D-3) 1000 UNITS CAPS Take 1,000 Units by mouth daily.    . fluticasone (FLONASE) 50 MCG/ACT nasal spray Place into both nostrils 3 times/day as needed-between meals & bedtime.     . gabapentin (NEURONTIN) 800 MG tablet Take 800 mg by mouth 4 (four) times daily.    Marland Kitchen ibuprofen (ADVIL,MOTRIN) 200 MG tablet Take 200 mg by mouth every 6 (six) hours as needed.    . primidone (MYSOLINE) 50 MG tablet Take 150 mg by mouth 2 (two) times daily.    . tamsulosin (FLOMAX) 0.4 MG CAPS capsule Take 1  capsule (0.4 mg total) by mouth at bedtime. 30 capsule 5  . vitamin B-12 (CYANOCOBALAMIN) 1000 MCG tablet Take 1,000 mcg by mouth daily.     No current facility-administered medications on file prior to visit.     Allergies  Allergen Reactions  . Codeine Nausea And Vomiting    All Codeine Related Drugs     Family History  Problem Relation Age of Onset  . Heart disease Mother   . Congestive Heart Failure Mother 66    Deceased  . Alcoholism Father     Living  . Arthritis Father   . Diabetes Maternal Aunt   . Cancer Other     PGGM  . Breast cancer Maternal Aunt   . Lung cancer Maternal Uncle   . Heart disease Brother   . Heart attack Brother   . Congestive Heart Failure Brother 47    Deceased  . Stroke Maternal Aunt   . Emphysema Brother     #2  . Arthritis Sister     #1  . Allergies Daughter   . Kidney Stones Daughter   . Gallbladder disease Daughter   . Migraines Daughter     Social History   Social History  . Marital status: Single    Spouse name: N/A  . Number of children: 3  . Years of  education: N/A   Occupational History  . [employer] Technician    Social History Main Topics  . Smoking status: Former Smoker    Packs/day: 2.00    Years: 30.00    Types: Cigarettes    Quit date: 11/20/2000  . Smokeless tobacco: Never Used  . Alcohol use None  . Drug use: Unknown  . Sexual activity: Not Asked   Other Topics Concern  . None   Social History Narrative  . None    Review of Systems - See HPI.  All other ROS are negative.  BP (!) 144/71 (BP Location: Left Arm, Patient Position: Sitting, Cuff Size: Large)   Pulse (!) 57   Temp 98.1 F (36.7 C) (Oral)   Resp 18   Ht 6\' 3"  (1.905 m)   Wt (!) 316 lb (143.3 kg)   SpO2 95%   BMI 39.50 kg/m   Physical Exam  Constitutional: He is oriented to person, place, and time and well-developed, well-nourished, and in no distress.  HENT:  Head: Normocephalic and atraumatic.  Right Ear: External ear normal.  Left  Ear: External ear normal.  Nose: Nose normal.  Mouth/Throat: Oropharynx is clear and moist. No oropharyngeal exudate.  TM within normal limits bilaterally.  Eyes: Conjunctivae are normal.  Neck: Neck supple.  Cardiovascular: Normal rate, regular rhythm, normal heart sounds and intact distal pulses.   Pulmonary/Chest: Effort normal and breath sounds normal. No respiratory distress. He has no wheezes. He has no rales. He exhibits no tenderness.  Neurological: He is alert and oriented to person, place, and time.  Skin: Skin is warm and dry. No rash noted.  Psychiatric: Affect normal.  Vitals reviewed.   Recent Results (from the past 2160 hour(s))  CBC     Status: Abnormal   Collection Time: 05/30/16  8:56 AM  Result Value Ref Range   WBC 5.9 4.0 - 10.5 K/uL   RBC 4.54 4.22 - 5.81 Mil/uL   Platelets 148.0 (L) 150.0 - 400.0 K/uL   Hemoglobin 15.0 13.0 - 17.0 g/dL   HCT 16.1 09.6 - 04.5 %   MCV 97.6 78.0 - 100.0 fl   MCHC 33.9 30.0 - 36.0 g/dL   RDW 40.9 81.1 - 91.4 %  Comprehensive metabolic panel     Status: Abnormal   Collection Time: 05/30/16  8:56 AM  Result Value Ref Range   Sodium 139 135 - 145 mEq/L   Potassium 4.6 3.5 - 5.1 mEq/L   Chloride 102 96 - 112 mEq/L   CO2 33 (H) 19 - 32 mEq/L   Glucose, Bld 94 70 - 99 mg/dL   BUN 17 6 - 23 mg/dL   Creatinine, Ser 7.82 0.40 - 1.50 mg/dL   Total Bilirubin 0.5 0.2 - 1.2 mg/dL   Alkaline Phosphatase 53 39 - 117 U/L   AST 26 0 - 37 U/L   ALT 22 0 - 53 U/L   Total Protein 7.0 6.0 - 8.3 g/dL   Albumin 4.2 3.5 - 5.2 g/dL   Calcium 9.4 8.4 - 95.6 mg/dL   GFR 21.30 >86.57 mL/min  Hemoglobin A1c     Status: None   Collection Time: 05/30/16  8:56 AM  Result Value Ref Range   Hgb A1c MFr Bld 5.4 4.6 - 6.5 %    Comment: Glycemic Control Guidelines for People with Diabetes:Non Diabetic:  <6%Goal of Therapy: <7%Additional Action Suggested:  >8%   Lipid panel     Status: Abnormal   Collection Time: 05/30/16  8:56 AM  Result Value Ref  Range   Cholesterol 173 0 - 200 mg/dL    Comment: ATP III Classification       Desirable:  < 200 mg/dL               Borderline High:  200 - 239 mg/dL          High:  > = 696240 mg/dL   Triglycerides 295.2100.0 0.0 - 149.0 mg/dL    Comment: Normal:  <841<150 mg/dLBorderline High:  150 - 199 mg/dL   HDL 32.4432.70 (L) >01.02>39.00 mg/dL   VLDL 72.520.0 0.0 - 36.640.0 mg/dL   LDL Cholesterol 440121 (H) 0 - 99 mg/dL   Total CHOL/HDL Ratio 5     Comment:                Men          Women1/2 Average Risk     3.4          3.3Average Risk          5.0          4.42X Average Risk          9.6          7.13X Average Risk          15.0          11.0                       NonHDL 140.59     Comment: NOTE:  Non-HDL goal should be 30 mg/dL higher than patient's LDL goal (i.e. LDL goal of < 70 mg/dL, would have non-HDL goal of < 100 mg/dL)  PSA     Status: None   Collection Time: 05/30/16  8:56 AM  Result Value Ref Range   PSA 1.38 0.10 - 4.00 ng/mL  TSH     Status: None   Collection Time: 05/30/16  8:56 AM  Result Value Ref Range   TSH 2.83 0.35 - 4.50 uIU/mL  Urinalysis, Routine w reflex microscopic (not at [facility])     Status: Abnormal   Collection Time: 05/30/16  8:56 AM  Result Value Ref Range   Color, Urine YELLOW Yellow;Lt. Yellow   APPearance CLEAR Clear   Specific Gravity, Urine 1.020 1.000 - 1.030   pH 6.0 5.0 - 8.0   Total Protein, Urine NEGATIVE Negative   Urine Glucose NEGATIVE Negative   Ketones, ur NEGATIVE Negative   Bilirubin Urine NEGATIVE Negative   Hgb urine dipstick NEGATIVE Negative   Urobilinogen, UA 0.2 0.0 - 1.0   Leukocytes, UA NEGATIVE Negative   Nitrite NEGATIVE Negative   WBC, UA 0-2/hpf 0-2/hpf   RBC / HPF none seen 0-2/hpf   Mucus, UA Presence of (A) None  Troponin I     Status: None   Collection Time: 05/30/16  8:56 AM  Result Value Ref Range   TNIDX 0.02 0.00 - 0.06 ug/l  Myocardial Perfusion Imaging     Status: None   Collection Time: 07/28/16 12:07 PM  Result Value Ref Range   Rest HR 48  bpm   Rest BP 149/72 mmHg   Peak HR 69 bpm   Peak BP 149/77 mmHg   SSS 7    SRS 6    SDS 1    LHR 0.34    TID 1.03    LV sys vol 98 mL   LV dias vol 204 62 - 150 mL  Assessment/Plan: 1. Acute bacterial bronchitis Lungs CTAB on exam. Will refill albuterol. Will begin Medrol pack for bronchospasm and chest tightness. Rx Azithromycin. CXR today to r/o pneumonia -- negative. Supportive measures reviewed. FU discussed.  - albuterol (PROVENTIL HFA;VENTOLIN HFA) 108 (90 Base) MCG/ACT inhaler; Inhale 2 puffs into the lungs every 6 (six) hours as needed for wheezing or shortness of breath.  Dispense: 1 Inhaler; Refill: 0 - DG Chest 2 View; Future - methylPREDNISolone (MEDROL DOSEPAK) 4 MG TBPK tablet; Take following package directions  Dispense: 21 tablet; Refill: 0   Piedad Climes, [place]

## 2016-08-22 ENCOUNTER — Telehealth: Payer: Self-pay | Admitting: *Deleted

## 2016-08-22 DIAGNOSIS — G4733 Obstructive sleep apnea (adult) (pediatric): Secondary | ICD-10-CM

## 2016-08-22 NOTE — Telephone Encounter (Signed)
-----   Message from Quintella Reichertraci R Turner, MD sent at 08/22/2016  2:02 PM EDT ----- Order ResMed CPAP with heated humidity and mask of choice with 2 week autotitration from 4 to 18cm H2O

## 2016-08-22 NOTE — Telephone Encounter (Signed)
Left message for patient to call back for results.  Order has been placed [facility] as patient is aware of results.

## 2016-08-24 ENCOUNTER — Telehealth: Payer: Self-pay | Admitting: Cardiology

## 2016-08-24 NOTE — Telephone Encounter (Signed)
Conversation being carried over to result notes.

## 2016-08-24 NOTE — Telephone Encounter (Signed)
Follow up      Patient returning call back to CMA .

## 2016-09-01 ENCOUNTER — Telehealth: Payer: Self-pay | Admitting: Cardiology

## 2016-09-01 NOTE — Telephone Encounter (Signed)
Called patient and gave him date, time and location of cardiac MRI. °

## 2016-09-13 ENCOUNTER — Encounter (HOSPITAL_COMMUNITY): Payer: Self-pay

## 2016-09-13 ENCOUNTER — Ambulatory Visit (HOSPITAL_COMMUNITY)
Admission: RE | Admit: 2016-09-13 | Discharge: 2016-09-13 | Disposition: A | Discharge status: Home / Self Care | Payer: BLUE CROSS/BLUE SHIELD | Source: Ambulatory Visit | Attending: Cardiology | Admitting: Cardiology

## 2016-09-13 ENCOUNTER — Telehealth: Payer: Self-pay | Admitting: Cardiology

## 2016-09-13 DIAGNOSIS — I422 Other hypertrophic cardiomyopathy: Secondary | ICD-10-CM

## 2016-09-13 NOTE — Telephone Encounter (Signed)
Left message for patient to call back  

## 2016-09-13 NOTE — Telephone Encounter (Signed)
New Message:    Please call,concerning the place where he can get his supplies.

## 2016-09-15 NOTE — Telephone Encounter (Signed)
Left message for patient to call if he still had any questions.

## 2016-09-21 ENCOUNTER — Encounter: Payer: Self-pay | Admitting: Cardiology

## 2016-09-21 ENCOUNTER — Telehealth: Payer: Self-pay

## 2016-09-21 DIAGNOSIS — G4733 Obstructive sleep apnea (adult) (pediatric): Secondary | ICD-10-CM

## 2016-09-21 DIAGNOSIS — I421 Obstructive hypertrophic cardiomyopathy: Secondary | ICD-10-CM

## 2016-09-21 NOTE — Telephone Encounter (Signed)
Returned patient's call.  Left message to call back tomorrow as phones are off.

## 2016-09-21 NOTE — Telephone Encounter (Signed)
-----   Message from Quintella Reichertraci R Turner, MD sent at 09/15/2016  2:16 PM EDT ----- Regarding: RE: MRI Please find out if patient has any history of syncope or family history of sudden cardiac death.    Armanda Magicraci Turner, MD ----- Message ----- From: Henrietta DineKathryn A Alieyah Spader, RN Sent: 09/13/2016   3:33 PM To: Quintella Reichertraci R Turner, MD Subject: FW: MRI                                        See below- patient unable to do MRI due to size ----- Message ----- From: Sampson GoonShawnee I Trigloff Sent: 09/13/2016  10:53 AM To: Henrietta DineKathryn A Simone Rodenbeck, RN Subject: MRI                                              Samara DeistKathryn  The patient did not fit in the scanner.  [place]

## 2016-09-21 NOTE — Telephone Encounter (Signed)
New message  Pt returning call to katy kemp  Please call pt back

## 2016-09-21 NOTE — Telephone Encounter (Signed)
This encounter was created in error - please disregard.

## 2016-09-21 NOTE — Telephone Encounter (Signed)
Left message to call back  

## 2016-10-05 NOTE — Telephone Encounter (Signed)
Patient reports he has never fainted, but does sometimes get a little lightheaded if he stands up too quickly.  He "feels great" now and has no symptoms. He reports his brother died of massive heart attack at age 64.  Instructed patient to call if symptoms occur. He has follow-up with Dr. Mayford Knifeurner in December. He understands he will be called if Dr. Mayford Knifeurner has any further recommendations at this time.

## 2016-10-05 NOTE — Telephone Encounter (Signed)
Please find out if they did an autopsy on his brother to know it was from CAD or did they assume it was a heart attack.  If it was sudden death then he may be at risk

## 2016-10-16 NOTE — Telephone Encounter (Signed)
The patient states he does not know if his brother died from confirmed CAD or if it was just assumed. He states the heart attack was a long time ago and "all he was ever told was he died from a heart attack." He states he assumed it was from "all the smoking and bad diet." The patient understands he could be at higher risk depending on how his brother died, he just does not know for sure. He states he will call if he thinks of anyone else in his family who passed away of sudden cardiac death.

## 2016-10-22 NOTE — Telephone Encounter (Signed)
Please refer this patient to EPDr. Camnitz for further evaluation.  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  Family history SCD of ? Cause.

## 2016-10-26 ENCOUNTER — Encounter: Payer: Self-pay | Admitting: Cardiology

## 2016-11-01 ENCOUNTER — Encounter (INDEPENDENT_AMBULATORY_CARE_PROVIDER_SITE_OTHER): Payer: Self-pay

## 2016-11-01 ENCOUNTER — Ambulatory Visit (INDEPENDENT_AMBULATORY_CARE_PROVIDER_SITE_OTHER): Payer: BLUE CROSS/BLUE SHIELD | Admitting: Cardiology

## 2016-11-01 ENCOUNTER — Encounter: Payer: Self-pay | Admitting: Cardiology

## 2016-11-01 VITALS — BP 138/72 | HR 55 | Ht 75.0 in | Wt 322.0 lb

## 2016-11-01 DIAGNOSIS — R001 Bradycardia, unspecified: Secondary | ICD-10-CM | POA: Diagnosis not present

## 2016-11-01 DIAGNOSIS — E669 Obesity, unspecified: Secondary | ICD-10-CM

## 2016-11-01 DIAGNOSIS — I421 Obstructive hypertrophic cardiomyopathy: Secondary | ICD-10-CM | POA: Diagnosis not present

## 2016-11-01 DIAGNOSIS — G4733 Obstructive sleep apnea (adult) (pediatric): Secondary | ICD-10-CM | POA: Diagnosis not present

## 2016-11-01 HISTORY — DX: Obstructive sleep apnea (adult) (pediatric): G47.33

## 2016-11-01 NOTE — Addendum Note (Signed)
Addended by: Gunnar FusiKEMP, KATHRYN A on: 11/01/2016 09:38 AM   Modules accepted: Orders

## 2016-11-01 NOTE — Telephone Encounter (Signed)
Patient was seen today by Dr. Mayford Knifeurner.  Prior to EP consult, will try to arrange MRI suitable for patient's size. Will call patient if MRI is possible.

## 2016-11-01 NOTE — Progress Notes (Signed)
Cardiology Office Note    Date:  11/01/2016   ID:  Warren RidgeWilliam Gawronski, DOB May 09, 1951, MRN [id number]  PCP:  Piedad ClimesMartin, Neldon Cody, PA-C  Cardiologist:  Armanda Magicraci Franshesca Chipman, MD   Chief Complaint  Patient presents with  . Sleep Apnea    History of Present Illness:  Warren Baker is a 65 y.o. male  with a history of GERD, presents today for followup of OSA.  He has a history of snoring loudly and stops breathing in his sleep. He underwent PSG showing mild OSA with an AHI of 11.3/hr and underwent auto CPAP titration.  He is doing well with his device.  He tolerates the full face mask and feels the pressure is adequate.  Since going on CPAP he feels more rested in the am and has less daytime sleepiness.  He has no significant mouth dryness or congestion.  He does not know if he still snores. 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  He has an uncle that he SCD and a brother who had a cardiac problem and got an AICD at 64yo.  The patient does not know all the details.  Past Medical History:  Diagnosis Date  . Allergic rhinitis   . Arthritis   . Broken leg   . Carbon dioxide poisoning   . Colon polyps   . Environmental allergies   . Excessive daytime sleepiness 07/13/2016  . GERD (gastroesophageal reflux disease)   . History of chicken pox   . Kidney stones   . Obesity (BMI 30-39.9) 07/13/2016  . OSA (obstructive sleep apnea) 11/01/2016   Mild with AHI 11/hr  . Seasonal allergies   . Snoring 07/13/2016  . Tremors of nervous system     Past Surgical History:  Procedure Laterality Date  . TONSILLECTOMY AND ADENOIDECTOMY    . WISDOM TOOTH EXTRACTION      Current Medications: Outpatient Medications Prior to Visit  Medication Sig Dispense Refill  . albuterol (PROVENTIL HFA;VENTOLIN HFA) 108 (90 Base) MCG/ACT inhaler Inhale 2 puffs into the lungs every 6 (six) hours as needed for wheezing or shortness  of breath. 1 Inhaler 0  . Ascorbic Acid (VITAMIN C) 1000 MG tablet Take 1,000 mg by mouth daily.    Marland Kitchen. aspirin 81 MG tablet Take 81 mg by mouth daily.    Marland Kitchen. azelastine (ASTELIN) 0.1 % nasal spray Place 2 sprays into both nostrils 2 (two) times daily. Use in each nostril as directed 30 mL 12  . azithromycin (ZITHROMAX) 250 MG tablet Take 2 tablets on Day 1. Then take 1 tablet daily. 6 tablet 0  . Cetirizine HCl 10 MG CAPS Take 10 mg by mouth daily.    . Cholecalciferol (VITAMIN D-3) 1000 UNITS CAPS Take 1,000 Units by mouth daily.    . fluticasone (FLONASE) 50 MCG/ACT nasal spray Place into both nostrils 3 times/day as needed-between meals & bedtime.     . gabapentin (NEURONTIN) 800 MG tablet Take 800 mg by mouth 4 (four) times daily.    Marland Kitchen. ibuprofen (ADVIL,MOTRIN) 200 MG tablet Take 200 mg by mouth every 6 (six) hours as needed.    . methylPREDNISolone (MEDROL DOSEPAK) 4 MG TBPK tablet Take following package directions 21 tablet 0  . primidone (MYSOLINE) 50 MG tablet Take 150 mg by mouth 2 (two) times daily.    . tamsulosin (FLOMAX) 0.4 MG CAPS capsule Take 1 capsule (0.4 mg total)  by mouth at bedtime. 30 capsule 5  . vitamin B-12 (CYANOCOBALAMIN) 1000 MCG tablet Take 1,000 mcg by mouth daily.     No facility-administered medications prior to visit.      Allergies:   Codeine   Social History   Social History  . Marital status: Single    Spouse name: N/A  . Number of children: 3  . 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 Smoking status: Former Smoker    Packs/day: 2.00    Years: 30.00    Types: Cigarettes    Quit date: 11/20/2000  . Smokeless tobacco: Never Used  . Alcohol use No  . Drug use: No  . Sexual activity: Not on file   Other Topics Concern  . Not on file   Social History Narrative  . No narrative on file     Family History:  The patient's family history includes Alcoholism in his father; Allergies in his  daughter; Arthritis in his father and sister; Breast cancer in his maternal aunt; Cancer in his other; Congestive Heart Failure (age of onset: 35) in his brother; Congestive Heart Failure (age of onset: 69) in his mother; Diabetes in his maternal aunt; Emphysema in his brother; Gallbladder disease in his daughter; Heart attack in his brother; Heart disease in his brother and mother; Kidney Stones in his daughter; Lung cancer in his maternal uncle; Migraines in his daughter; Stroke in his maternal aunt.   ROS:   Please see the history of present illness.    ROS All other systems reviewed and are negative.  No flowsheet data found.     PHYSICAL EXAM:   VS:  BP 138/72   Pulse (!) 55   Ht 6\' 3"  (1.905 m)   Wt (!) 322 lb (146.1 kg)   BMI 40.25 kg/m    GEN: Well nourished, well developed, in no acute distress  HEENT: normal  Neck: no JVD, carotid bruits, or masses Cardiac: RRR; no murmurs, rubs, or gallops,no edema.  Intact distal pulses bilaterally.  Respiratory:  clear to auscultation bilaterally, normal work of breathing GI: soft, nontender, nondistended, + BS MS: no deformity or atrophy  Skin: warm and dry, no rash Neuro:  Alert and Oriented x 3, Strength and sensation are intact Psych: euthymic mood, full affect  Wt Readings from Last 3 Encounters:  11/01/16 (!) 322 lb (146.1 kg)  08/15/16 (!) 316 lb (143.3 kg)  07/27/16 (!) 312 lb (141.5 kg)      Studies/Labs Reviewed:   EKG:  EKG is not ordered today.    Recent Labs: 05/30/2016: ALT 22; BUN 17; Creatinine, Ser 0.94; Hemoglobin 15.0; Platelets 148.0; Potassium 4.6; Sodium 139; TSH 2.83   Lipid Panel    Component Value Date/Time   CHOL 173 05/30/2016 0856   TRIG 100.0 05/30/2016 0856   HDL 32.70 (L) 05/30/2016 0856   CHOLHDL 5 05/30/2016 0856   VLDL 20.0 05/30/2016 0856   LDLCALC 121 (H) 05/30/2016 0856    Additional studies/ records that were reviewed today include:  CPAP download    ASSESSMENT:    1. OSA  (obstructive sleep apnea)   2. Obesity (BMI 30-39.9)   3. Bradycardia   4. HOCM (hypertrophic obstructive cardiomyopathy) (HCC)      PLAN:  In order of problems listed above:  OSA - the patient is tolerating PAP therapy well without any problems. The PAP download was reviewed today and  showed an AHI of 1.1/hr on auto CPAP with 97% compliance in using more than 4 hours nightly.  The patient has been using and benefiting from CPAP use and will continue to benefit from therapy. His 95th% pressure was 11.4 so I will set him on 11cm H2O and repeat download in 4 weeks.   Obesity - I have encouraged him to get into a routine exercise program and cut back on carbs and portions.  3.   Bradycardia - HR 55bpm today off BB. 4.   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  He has a family history of SCD in a brother at 4927 who got an AICD.  He has been referred to EP for evaluation for AICD.      Medication Adjustments/Labs and Tests Ordered: Current medicines are reviewed at length with the patient today.  Concerns regarding medicines are outlined above.  Medication changes, Labs and Tests ordered today are listed in the Patient Instructions below.  There are no Patient Instructions on file for this visit.   Signed, Armanda Magicraci Brihana Quickel, MD  11/01/2016 9:21 AM    [facility] [address], [place], [place]  [address] Phone: [phone]; Fax: [phone]

## 2016-11-01 NOTE — Patient Instructions (Signed)
Medication Instructions:  Your physician recommends that you continue on your current medications as directed. Please refer to the Current Medication list given to you today.   Labwork: None  Testing/Procedures: None  Follow-Up: Your physician wants you to follow-up in: 1 year with Dr. Mayford Knifeurner. You will receive a reminder letter in the mail two months in advance. If you don't receive a letter, please call our office to schedule the follow-up appointment.   Any Other Special Instructions Will Be Listed Below (If Applicable). We are trying to get an MRI option for you so Dr. Mayford Knifeurner can see the structure of your heart.  Your CPAP is going to be set to 11 cm. We will get a download in 4 weeks and call you with an update.    If you need a refill on your cardiac medications before your next appointment, please call your pharmacy.

## 2016-11-02 ENCOUNTER — Telehealth: Payer: Self-pay | Admitting: *Deleted

## 2016-11-02 NOTE — Telephone Encounter (Signed)
-----   Message from Henrietta DineKathryn A Kemp, RN sent at 11/01/2016  4:28 PM EST ----- Regarding: RE: dme order I have forwarded to Coralee NorthNina, Dr. Norris Crossurner's CPAP assistant. She keeps track of that. If you run into issues like that (because I don't know), please let her know so she can follow up appropriately. I have CC'd her in this message so you have her name to contact.  Thanks so much! ----- Message ----- From: Sheron NightingaleJason Pierce Sent: 11/01/2016  11:44 AM To: Henrietta DineKathryn A Kemp, RN Subject: RE: dme order                                  [facility] because he needed a National provider like [facility] or [facility].  I sent this information over to you on 10/6.  We can't provide this service as we didn't supply the cpap. ----- Message ----- From: Henrietta DineKathryn A Kemp, RN Sent: 11/01/2016   9:45 AM To: Henderson NewcomerMelissa Stenson, Reesa Cheworothea G Brendy Ficek, CMA Subject: dme order                                      Order is in!  Nina-please get a download 4 weeks after pressure change is made  Thanks!

## 2016-11-02 NOTE — Telephone Encounter (Signed)
I have called [facility] ( milikha)  and [facility] Rod Holler(Gilda)  and neither DME has this patient in their system. He is going to call me back today after work to clarify who his DME is

## 2016-11-06 ENCOUNTER — Telehealth: Payer: Self-pay | Admitting: *Deleted

## 2016-11-06 NOTE — Telephone Encounter (Signed)
-----   Message from Kathryn A Kemp, RN sent at 11/01/2016  4:28 PM EST ----- Regarding: RE: dme order I have forwarded to Nina, Dr. Turner's CPAP assistant. She keeps track of that. If you run into issues like that (because I don't know), please let her know so she can follow up appropriately. I have CC'd her in this message so you have her name to contact.  Thanks so much! ----- Message ----- From: Jason Pierce Sent: 11/01/2016  11:44 AM To: Kathryn A Kemp, RN Subject: RE: dme order                                  This pt wasn't set up with [facility] because he needed a National provider like [facility] or [facility].  I sent this information over to you on 10/6.  We can't provide this service as we didn't supply the cpap. ----- Message ----- From: Kathryn A Kemp, RN Sent: 11/01/2016   9:45 AM To: Melissa Stenson, Letita Prentiss G Shayden Bobier, CMA Subject: dme order                                      Order is in!  Nina-please get a download 4 weeks after pressure change is made  Thanks!   

## 2016-11-06 NOTE — Telephone Encounter (Signed)
Called the patient he states that [facility] is his DME. I am faxing over the pressure change order today to attn: Victorino DikeJennifer

## 2016-11-18 ENCOUNTER — Other Ambulatory Visit: Payer: Self-pay | Admitting: Physician Assistant

## 2016-11-26 ENCOUNTER — Other Ambulatory Visit: Payer: Self-pay | Admitting: Physician Assistant

## 2016-11-27 ENCOUNTER — Encounter: Payer: Self-pay | Admitting: Cardiology

## 2016-12-04 ENCOUNTER — Encounter: Payer: Self-pay | Admitting: Cardiology

## 2016-12-08 ENCOUNTER — Telehealth: Payer: Self-pay | Admitting: *Deleted

## 2016-12-08 NOTE — Telephone Encounter (Signed)
Called and left a message on vm to return my call 

## 2016-12-17 ENCOUNTER — Encounter: Payer: Self-pay | Admitting: Cardiology

## 2016-12-19 NOTE — Telephone Encounter (Signed)
[facility]oke with [facility] technician who states they have a MRI large enough for the patient (316 lb, 6'3'').  MRI ordered for scheduling.  Notified patient appropriate MRI machine has been found for [facility] at [facility].  He understands he will be called to arrange appointment. Cardiac MRI ordered for scheduling.

## 2016-12-19 NOTE — Addendum Note (Signed)
Addended by: Gunnar FusiKEMP, Jeremih Dearmas A on: 12/19/2016 04:56 PM   Modules accepted: Orders

## 2016-12-21 ENCOUNTER — Telehealth: Payer: Self-pay | Admitting: *Deleted

## 2016-12-21 ENCOUNTER — Telehealth: Payer: Self-pay | Admitting: Cardiology

## 2016-12-21 NOTE — Telephone Encounter (Signed)
-----   Message from Traci R Turner, MD sent at 12/21/2016  2:29 PM EST ----- Good AHI and compliance.  Continue current CPAP settings. 

## 2016-12-21 NOTE — Telephone Encounter (Signed)
Called the patient and gave him his results, he verbalized understanding and agreed 

## 2016-12-21 NOTE — Telephone Encounter (Signed)
New message      Order was put in epic for a MR cardiac morphology w wo contrast-----they do not do this test.   Calling to let Dr Mayford Knifeurner know.

## 2017-01-02 NOTE — Telephone Encounter (Signed)
Cecil CrankerDonna M Price 12 days ago     New message      Order was put in epic for a MR cardiac morphology w wo contrast-----they do not do this test.   Calling to let Dr Mayford Knifeurner know.      Documentation     [email or web address] [phone]  Cecil CrankerDonna M Price     Left message for Elease Hashimotoatricia to call back to order appropriate testing.

## 2017-02-07 ENCOUNTER — Encounter: Payer: Self-pay | Admitting: Physician Assistant

## 2017-02-07 ENCOUNTER — Ambulatory Visit (INDEPENDENT_AMBULATORY_CARE_PROVIDER_SITE_OTHER): Payer: Medicare HMO | Admitting: Physician Assistant

## 2017-02-07 VITALS — BP 122/70 | HR 48 | Temp 98.1°F | Resp 16 | Ht 75.0 in | Wt 323.0 lb

## 2017-02-07 DIAGNOSIS — B9689 Other specified bacterial agents as the cause of diseases classified elsewhere: Secondary | ICD-10-CM

## 2017-02-07 DIAGNOSIS — J019 Acute sinusitis, unspecified: Secondary | ICD-10-CM | POA: Diagnosis not present

## 2017-02-07 DIAGNOSIS — R001 Bradycardia, unspecified: Secondary | ICD-10-CM

## 2017-02-07 MED ORDER — DOXYCYCLINE HYCLATE 100 MG PO CAPS: 100.0000 mg | ORAL_CAPSULE | Freq: Two times a day (BID) | ORAL | 0 refills | Status: DC

## 2017-02-07 MED ORDER — BENZONATATE 100 MG PO CAPS: 100.0000 mg | ORAL_CAPSULE | Freq: Two times a day (BID) | ORAL | 0 refills | Status: DC | PRN

## 2017-02-07 NOTE — Progress Notes (Signed)
Pre visit review using our clinic review tool, if applicable. No additional management support is needed unless otherwise documented below in the visit note. 

## 2017-02-07 NOTE — Patient Instructions (Signed)
Stop the Propranolol -- your heart rate is too low on this medication as we have discussed before. I will try to reach out to your neurologist to again discuss this as this is a recurrent issue where either I or Cardiology has to stop the medication due to significantly slowed heart rate, and the Neurologist restarts for tremor.    Please take antibiotic as directed.  Increase fluid intake.  Use Saline nasal spray.  Take a daily multivitamin. Tessalon as directed for cough.  Place a humidifier in the bedroom.  Please call or return clinic if symptoms are not improving.  Sinusitis Sinusitis is redness, soreness, and swelling (inflammation) of the paranasal sinuses. Paranasal sinuses are air pockets within the bones of your face (beneath the eyes, the middle of the forehead, or above the eyes). In healthy paranasal sinuses, mucus is able to drain out, and air is able to circulate through them by way of your nose. However, when your paranasal sinuses are inflamed, mucus and air can become trapped. This can allow bacteria and other germs to grow and cause infection. Sinusitis can develop quickly and last only a short time (acute) or continue over a long period (chronic). Sinusitis that lasts for more than 12 weeks is considered chronic.  CAUSES  Causes of sinusitis include:  Allergies.  Structural abnormalities, such as displacement of the cartilage that separates your nostrils (deviated septum), which can decrease the air flow through your nose and sinuses and affect sinus drainage.  Functional abnormalities, such as when the small hairs (cilia) that line your sinuses and help remove mucus do not work properly or are not present. SYMPTOMS  Symptoms of acute and chronic sinusitis are the same. The primary symptoms are pain and pressure around the affected sinuses. Other symptoms include:  Upper toothache.  Earache.  Headache.  Bad breath.  Decreased sense of smell and taste.  A cough, which  worsens when you are lying flat.  Fatigue.  Fever.  Thick drainage from your nose, which often is green and may contain pus (purulent).  Swelling and warmth over the affected sinuses. DIAGNOSIS  Your caregiver will perform a physical exam. During the exam, your caregiver may:  Look in your nose for signs of abnormal growths in your nostrils (nasal polyps).  Tap over the affected sinus to check for signs of infection.  View the inside of your sinuses (endoscopy) with a special imaging device with a light attached (endoscope), which is inserted into your sinuses. If your caregiver suspects that you have chronic sinusitis, one or more of the following tests may be recommended:  Allergy tests.  Nasal culture A sample of mucus is taken from your nose and sent to a lab and screened for bacteria.  Nasal cytology A sample of mucus is taken from your nose and examined by your caregiver to determine if your sinusitis is related to an allergy. TREATMENT  Most cases of acute sinusitis are related to a viral infection and will resolve on their own within 10 days. Sometimes medicines are prescribed to help relieve symptoms (pain medicine, decongestants, nasal steroid sprays, or saline sprays).  However, for sinusitis related to a bacterial infection, your caregiver will prescribe antibiotic medicines. These are medicines that will help kill the bacteria causing the infection.  Rarely, sinusitis is caused by a fungal infection. In theses cases, your caregiver will prescribe antifungal medicine. For some cases of chronic sinusitis, surgery is needed. Generally, these are cases in which sinusitis recurs more  than 3 times per year, despite other treatments. HOME CARE INSTRUCTIONS   Drink plenty of water. Water helps thin the mucus so your sinuses can drain more easily.  Use a humidifier.  Inhale steam 3 to 4 times a day (for example, sit in the bathroom with the shower running).  Apply a warm,  moist washcloth to your face 3 to 4 times a day, or as directed by your caregiver.  Use saline nasal sprays to help moisten and clean your sinuses.  Take over-the-counter or prescription medicines for pain, discomfort, or fever only as directed by your caregiver. SEEK IMMEDIATE MEDICAL CARE IF:  You have increasing pain or severe headaches.  You have nausea, vomiting, or drowsiness.  You have swelling around your face.  You have vision problems.  You have a stiff neck.  You have difficulty breathing. MAKE SURE YOU:   Understand these instructions.  Will watch your condition.  Will get help right away if you are not doing well or get worse. Document Released: 11/06/2005 Document Revised: 01/29/2012 Document Reviewed: 11/21/2011 [facility] Patient Information 2014 [place], [place].

## 2017-02-07 NOTE — Progress Notes (Signed)
Patient presents to clinic today c/o 5 days of worsening sinus pressure, sinus pain with PND, cough that is sometimes productive of yellow sputum.  Denies chest congestion or chest pain. Denies shortness of breath, lightheadedness or dizziness. Denies ear pain. Denies recent travel or sick contact.   Of note, patient's heart rate 42 on initial assessment by CMA. Is currently on propranolol 40 mg daily prescribed by Neurology for tremors. Patient denies chest pain, palpitations, lightheadedness, dizziness, vision changes or frequent headaches. Is followed by Cardiology for HOCM. Bradycardia has been an issue previously, exacerbated by prior use of Propranolol. We have taken patient off of this medication before but is continually restarted by Neurology.    Past Medical History:  Diagnosis Date  . Allergic rhinitis   . Arthritis   . Broken leg   . Carbon dioxide poisoning   . Colon polyps   . Environmental allergies   . Excessive daytime sleepiness 07/13/2016  . GERD (gastroesophageal reflux disease)   . History of chicken pox   . 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 Kidney stones   . Obesity (BMI 30-39.9) 07/13/2016  . OSA (obstructive sleep apnea) 11/01/2016   Mild with AHI 11/hr  . Seasonal allergies   . Snoring 07/13/2016  . Tremors of nervous system     Current Outpatient Prescriptions on File Prior to Visit  Medication Sig Dispense Refill  . albuterol (PROVENTIL HFA;VENTOLIN HFA) 108 (90 Base) MCG/ACT inhaler Inhale 2 puffs into the lungs every 6 (six) hours as needed for wheezing or shortness of breath. 1 Inhaler 0  . Ascorbic Acid (VITAMIN C) 1000 MG tablet Take 1,000 mg by mouth daily.    Marland Kitchen aspirin 81 MG tablet Take 81 mg by mouth daily.    Marland Kitchen azelastine (ASTELIN) 0.1 % nasal spray USE 2 SPRAYS IN EACH NOSTRILS TWICE A DAY AS DIRECTED 30 mL 11  . Cetirizine HCl 10 MG CAPS Take 10 mg by mouth daily.    .  Cholecalciferol (VITAMIN D-3) 1000 UNITS CAPS Take 1,000 Units by mouth daily.    Marland Kitchen gabapentin (NEURONTIN) 800 MG tablet Take 800 mg by mouth 4 (four) times daily.    Marland Kitchen ibuprofen (ADVIL,MOTRIN) 200 MG tablet Take 200 mg by mouth every 6 (six) hours as needed.    . primidone (MYSOLINE) 50 MG tablet Take 150 mg by mouth 2 (two) times daily.    . tamsulosin (FLOMAX) 0.4 MG CAPS capsule TAKE 1 CAPSULE (0.4 MG TOTAL) BY MOUTH AT BEDTIME. 30 capsule 3  . vitamin B-12 (CYANOCOBALAMIN) 1000 MCG tablet Take 1,000 mcg by mouth daily.     No current facility-administered medications on file prior to visit.     Allergies  Allergen Reactions  . Codeine Nausea And Vomiting    All Codeine Related Drugs     Family History  Problem Relation Age of Onset  . Heart disease Mother   . Congestive Heart Failure Mother 70    Deceased  . Alcoholism Father     Living  . Arthritis Father   . Diabetes Maternal Aunt   . Cancer Other     PGGM  . Breast cancer Maternal Aunt   . Lung cancer Maternal Uncle   . Heart disease Brother   . Heart attack Brother   . Congestive Heart Failure Brother 47    Deceased  . Stroke Maternal Aunt   . Emphysema Brother     #  2  . Arthritis Sister     #1  . Allergies Daughter   . Kidney Stones Daughter   . Gallbladder disease Daughter   . Migraines Daughter     Social History   Social History  . Marital status: Single    Spouse name: N/A  . Number of children: 3  . Years of education: N/A   Occupational History  . [employer]    Social History Main Topics  . Smoking status: Former Smoker    Packs/day: 2.00    Years: 30.00    Types: Cigarettes    Quit date: 11/20/2000  . Smokeless tobacco: Never Used  . Alcohol use No  . Drug use: No  . Sexual activity: Not Asked   Other Topics Concern  . None   Social History Narrative  . None   Review of Systems - See HPI.  All other ROS are negative.  BP 122/70   Pulse (!) 48   Temp 98.1 F (36.7 C)  (Oral)   Resp 16   Ht 6\' 3"  (1.905 m)   Wt (!) 323 lb (146.5 kg)   SpO2 93%   BMI 40.37 kg/m   Physical Exam  Constitutional: He is oriented to person, place, and time and well-developed, well-nourished, and in no distress.  HENT:  Head: Normocephalic and atraumatic.  Right Ear: Tympanic membrane and external ear normal.  Left Ear: Tympanic membrane and external ear normal.  Nose: Mucosal edema and rhinorrhea present. Right sinus exhibits maxillary sinus tenderness and frontal sinus tenderness.  Mouth/Throat: Uvula is midline and oropharynx is clear and moist.  Eyes: Conjunctivae are normal.  Neck: Neck supple.  Cardiovascular: Normal rate, regular rhythm, normal heart sounds and intact distal pulses.   Pulmonary/Chest: Effort normal and breath sounds normal. He has no rales.  Lymphadenopathy:    He has no cervical adenopathy.  Neurological: He is alert and oriented to person, place, and time.  Skin: Skin is warm and dry.  Vitals reviewed.  Assessment/Plan: 1. Acute bacterial sinusitis Rx Doxycycline.  Increase fluids.  Rest.  Saline nasal spray.  Probiotic.  Tessalon as directed.  Humidifier in bedroom.  Call or return to clinic if symptoms are not improving.   2. Bradycardia Worse than typical 2/2 restarting Propranolol per Neurology. Asymptomatic. Again told patient to stop Propranolol. Will reach out to his Neurologist regarding this current issue. FU 1 week for BP check.   Piedad ClimesMartin, Kahlil Cody, PA-C

## 2017-03-09 NOTE — Telephone Encounter (Signed)
Confirmed with [facility] that they do not do cardiac MRIs.  Called [facility]- they do not do cardiac MRIs.  Called [facility]. Spoke with Marcelino Duster, who states they do Cardiac MRIs and their weight limit is 600 pounds. She gave instruction to fax order to [phone] and they will call patient to arrange Cardiac MRI.  Called patient to relay new plan - left message to call back.

## 2017-03-13 ENCOUNTER — Encounter: Payer: Self-pay | Admitting: Cardiology

## 2017-03-13 NOTE — Telephone Encounter (Signed)
This encounter was created in error - please disregard.

## 2017-03-13 NOTE — Telephone Encounter (Signed)
Patient states that he is returning your call, thanks. °

## 2017-03-13 NOTE — Telephone Encounter (Signed)
Returned patient's call. Left message to call back.

## 2017-03-14 NOTE — Telephone Encounter (Signed)
Patient states he will do cardiac MRI at [facility] provided his insurance covers the test. Informed him test will be ordered and the precert department will call him to address his billing questions. He was grateful for call.

## 2017-03-15 ENCOUNTER — Other Ambulatory Visit: Payer: Self-pay | Admitting: Physician Assistant

## 2017-04-05 ENCOUNTER — Ambulatory Visit (INDEPENDENT_AMBULATORY_CARE_PROVIDER_SITE_OTHER): Payer: Medicare HMO | Admitting: Physician Assistant

## 2017-04-05 ENCOUNTER — Encounter: Payer: Self-pay | Admitting: Physician Assistant

## 2017-04-05 VITALS — BP 122/70 | HR 52 | Temp 97.9°F | Resp 16 | Ht 75.0 in | Wt 322.0 lb

## 2017-04-05 DIAGNOSIS — S80861A Insect bite (nonvenomous), right lower leg, initial encounter: Secondary | ICD-10-CM | POA: Diagnosis not present

## 2017-04-05 DIAGNOSIS — W57XXXA Bitten or stung by nonvenomous insect and other nonvenomous arthropods, initial encounter: Secondary | ICD-10-CM | POA: Diagnosis not present

## 2017-04-05 MED ORDER — DOXYCYCLINE HYCLATE 100 MG PO CAPS: 100.0000 mg | ORAL_CAPSULE | Freq: Two times a day (BID) | ORAL | 0 refills | Status: DC

## 2017-04-05 NOTE — Progress Notes (Signed)
Patient presents to clinic today c/o tick bite to the R posterior thigh, first noted 4 days ago. States he was able to remove the entire tick. Notes it was a very small tick. Noted some mild irritation at the site at that time. Noted the next day the surrounding skin was irritated. Has noted redness since that time with hardening of skin. Denies fever, chills, nausea, vomiting or headache. Denies bulls-eye rash, etc.   Past Medical History:  Diagnosis Date  . Allergic rhinitis   . Arthritis   . Broken leg   . Carbon dioxide poisoning   . Colon polyps   . Environmental allergies   . Excessive daytime sleepiness 07/13/2016  . GERD (gastroesophageal reflux disease)   . History of chicken pox   . 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 Kidney stones   . Obesity (BMI 30-39.9) 07/13/2016  . OSA (obstructive sleep apnea) 11/01/2016   Mild with AHI 11/hr  . Seasonal allergies   . Snoring 07/13/2016  . Tremors of nervous system     Current Outpatient Prescriptions on File Prior to Visit  Medication Sig Dispense Refill  . albuterol (PROVENTIL HFA;VENTOLIN HFA) 108 (90 Base) MCG/ACT inhaler Inhale 2 puffs into the lungs every 6 (six) hours as needed for wheezing or shortness of breath. 1 Inhaler 0  . Ascorbic Acid (VITAMIN C) 1000 MG tablet Take 1,000 mg by mouth daily.    Marland Kitchen aspirin 81 MG tablet Take 81 mg by mouth daily.    Marland Kitchen azelastine (ASTELIN) 0.1 % nasal spray USE 2 SPRAYS IN EACH NOSTRILS TWICE A DAY AS DIRECTED 30 mL 11  . Cetirizine HCl 10 MG CAPS Take 10 mg by mouth daily.    . Cholecalciferol (VITAMIN D-3) 1000 UNITS CAPS Take 1,000 Units by mouth daily.    Marland Kitchen gabapentin (NEURONTIN) 800 MG tablet Take 800 mg by mouth 4 (four) times daily.    Marland Kitchen ibuprofen (ADVIL,MOTRIN) 200 MG tablet Take 200 mg by mouth every 6 (six) hours as needed.    . primidone (MYSOLINE) 50 MG tablet Take 150 mg by mouth 2 (two) times daily.      . tamsulosin (FLOMAX) 0.4 MG CAPS capsule TAKE 1 CAPSULE (0.4 MG TOTAL) BY MOUTH AT BEDTIME. 30 capsule 3  . vitamin B-12 (CYANOCOBALAMIN) 1000 MCG tablet Take 1,000 mcg by mouth daily.     No current facility-administered medications on file prior to visit.     Allergies  Allergen Reactions  . Codeine Nausea And Vomiting    All Codeine Related Drugs     Family History  Problem Relation Age of Onset  . Heart disease Mother   . Congestive Heart Failure Mother 36       Deceased  . Alcoholism Father        Living  . Arthritis Father   . Diabetes Maternal Aunt   . Cancer Other        PGGM  . Breast cancer Maternal Aunt   . Lung cancer Maternal Uncle   . Heart disease Brother   . Heart attack Brother   . Congestive Heart Failure Brother 47       Deceased  . Stroke Maternal Aunt   . Emphysema Brother        #2  . Arthritis Sister        #1  . Allergies Daughter   . Kidney Stones Daughter   .  Gallbladder disease Daughter   . Migraines Daughter     Social History   Social History  . Marital status: Single    Spouse name: N/A  . Number of children: 3  . Years of education: N/A   Occupational History  . [employer]    Social History Main Topics  . Smoking status: Former Smoker    Packs/day: 2.00    Years: 30.00    Types: Cigarettes    Quit date: 11/20/2000  . Smokeless tobacco: Never Used  . Alcohol use No  . Drug use: No  . Sexual activity: Not Asked   Other Topics Concern  . None   Social History Narrative  . None   Review of Systems - See HPI.  All other ROS are negative.  BP 122/70   Pulse (!) 52   Temp 97.9 F (36.6 C) (Oral)   Resp 16   Ht 6\' 3"  (1.905 m)   Wt (!) 322 lb (146.1 kg)   SpO2 96%   BMI 40.25 kg/m   Physical Exam  Constitutional: He is oriented to person, place, and time and well-developed, well-nourished, and in no distress.  HENT:  Head: Normocephalic and atraumatic.  Eyes: Conjunctivae are normal.  Neck: Neck supple.   Cardiovascular: Normal rate, regular rhythm, normal heart sounds and intact distal pulses.   Pulmonary/Chest: Effort normal and breath sounds normal. No respiratory distress. He has no wheezes. He has no rales. He exhibits no tenderness.  Neurological: He is alert and oriented to person, place, and time.  Skin: Skin is warm and dry.     Psychiatric: Affect normal.  Vitals reviewed.  Assessment/Plan: 1. Tick bite, initial encounter With cellulitis. Will start Doxycycline as this will treat Cellulitis and any potential rickettsial infection. Patient afebrile. Notes symptoms slightly improved since yesterday. Supportive measures reviewed. Discussed with patient that if there is any worsening of symptoms or development of fever over weekend, he is to go immediately to the ER. F/U scheduled for Monday.  - doxycycline (VIBRAMYCIN) 100 MG capsule; Take 1 capsule (100 mg total) by mouth 2 (two) times daily.  Dispense: 14 capsule; Refill: 0   Piedad ClimesMartin, Nolton Cody, [place]-C

## 2017-04-05 NOTE — Progress Notes (Signed)
Pre visit review using our clinic review tool, if applicable. No additional management support is needed unless otherwise documented below in the visit note. 

## 2017-04-05 NOTE — Patient Instructions (Signed)
Please take the antibiotic as directed.  Keep skin clean and dry. Avoid scratching at the area.  Follow-up with me on Monday. If you note any worsening symptoms or development of fever over the weekend, go to the ER for assessment.

## 2017-04-09 ENCOUNTER — Ambulatory Visit (INDEPENDENT_AMBULATORY_CARE_PROVIDER_SITE_OTHER): Payer: Medicare HMO | Admitting: Physician Assistant

## 2017-04-09 ENCOUNTER — Encounter: Payer: Self-pay | Admitting: Physician Assistant

## 2017-04-09 VITALS — BP 130/78 | HR 80 | Temp 97.9°F | Resp 14 | Ht 75.0 in | Wt 322.0 lb

## 2017-04-09 DIAGNOSIS — S80861A Insect bite (nonvenomous), right lower leg, initial encounter: Secondary | ICD-10-CM | POA: Diagnosis not present

## 2017-04-09 DIAGNOSIS — W57XXXA Bitten or stung by nonvenomous insect and other nonvenomous arthropods, initial encounter: Secondary | ICD-10-CM

## 2017-04-09 NOTE — Patient Instructions (Signed)
Please finish the entire course of antibiotic. Keep area clean and dry.  Symptoms should continue to resolve. If there is any worsening of the redness or tenderness, or if you note fever, rash or headaches, please return to office.    Tick Bite Information Introduction Ticks are insects that attach themselves to the skin. There are many types of ticks. Common types include wood ticks and deer ticks. Sometimes, ticks carry diseases that can make a person very ill. The most common places for ticks to attach themselves are the scalp, neck, armpits, waist, and groin. HOW CAN YOU PREVENT TICK BITES? Take these steps to help prevent tick bites when you are outdoors:  Wear long sleeves and long pants.  Wear white clothes so you can see ticks more easily.  Tuck your pant legs into your socks.  If walking on a trail, stay in the middle of the trail to avoid brushing against bushes.  Avoid walking through areas with long grass.  Put bug spray on all skin that is showing and along boot tops, pant legs, and sleeve cuffs.  Check clothes, hair, and skin often and before going inside.  Brush off any ticks that are not attached.  Take a shower or bath as soon as possible after being outdoors. HOW SHOULD YOU REMOVE A TICK? Ticks should be removed as soon as possible to help prevent diseases. 1. If latex gloves are available, put them on before trying to remove a tick. 2. Use tweezers to grasp the tick as close to the skin as possible. You may also use curved forceps or a tick removal tool. Grasp the tick as close to its head as possible. Avoid grasping the tick on its body. 3. Pull gently upward until the tick lets go. Do not twist the tick or jerk it suddenly. This may break off the tick's head or mouth parts. 4. Do not squeeze or crush the tick's body. This could force disease-carrying fluids from the tick into your body. 5. After the tick is removed, wash the bite area and your hands with soap  and water or alcohol. 6. Apply a small amount of antiseptic cream or ointment to the bite site. 7. Wash any tools that were used. Do not try to remove a tick by applying a hot match, petroleum jelly, or fingernail polish to the tick. These methods do not work. They may also increase the chances of disease being spread from the tick bite. WHEN SHOULD YOU SEEK HELP? Contact your health care provider if you are unable to remove a tick or if a part of the tick breaks off in the skin. After a tick bite, you need to watch for signs and symptoms of diseases that can be spread by ticks. Contact your health care provider if you develop any of the following:  Fever.  Rash.  Redness and puffiness (swelling) in the area of the tick bite.  Tender, puffy lymph glands.  Watery poop (diarrhea).  Weight loss.  Cough.  Feeling more tired than normal (fatigue).  Muscle, joint, or bone pain.  Belly (abdominal) pain.  Headache.  Change in your level of consciousness.  Trouble walking or moving your legs.  Loss of feeling (numbness) in the legs.  Loss of movement (paralysis).  Shortness of breath.  Confusion.  Throwing up (vomiting) many times. This information is not intended to replace advice given to you by your health care provider. Make sure you discuss any questions you have with your health  care provider. Document Released: 01/31/2010 Document Revised: 04/13/2016 Document Reviewed: 04/16/2013 Elsevier Interactive Patient Education  2017 [employer].

## 2017-04-09 NOTE — Progress Notes (Signed)
Pre visit review using our clinic review tool, if applicable. No additional management support is needed unless otherwise documented below in the visit note. 

## 2017-04-09 NOTE — Progress Notes (Signed)
Patient presents to clinic today to follow-up for tick bite to R posterior thigh with cellulitis. At last visit patient was started on Doxycycline. Patient endorses tolerating medication without side effect. Is taking as directed. Denies fever, chills, headache or rash. Notes the area is improving daily. Denies new concerns.  Past Medical History:  Diagnosis Date  . Allergic rhinitis   . Arthritis   . Broken leg   . Carbon dioxide poisoning   . Colon polyps   . Environmental allergies   . Excessive daytime sleepiness 07/13/2016  . GERD (gastroesophageal reflux disease)   . History of chicken pox   . 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 Kidney stones   . Obesity (BMI 30-39.9) 07/13/2016  . OSA (obstructive sleep apnea) 11/01/2016   Mild with AHI 11/hr  . Seasonal allergies   . Snoring 07/13/2016  . Tremors of nervous system     Current Outpatient Prescriptions on File Prior to Visit  Medication Sig Dispense Refill  . albuterol (PROVENTIL HFA;VENTOLIN HFA) 108 (90 Base) MCG/ACT inhaler Inhale 2 puffs into the lungs every 6 (six) hours as needed for wheezing or shortness of breath. 1 Inhaler 0  . Ascorbic Acid (VITAMIN C) 1000 MG tablet Take 1,000 mg by mouth daily.    Marland Kitchen. aspirin 81 MG tablet Take 81 mg by mouth daily.    Marland Kitchen. azelastine (ASTELIN) 0.1 % nasal spray USE 2 SPRAYS IN EACH NOSTRILS TWICE A DAY AS DIRECTED 30 mL 11  . Cetirizine HCl 10 MG CAPS Take 10 mg by mouth daily.    . Cholecalciferol (VITAMIN D-3) 1000 UNITS CAPS Take 1,000 Units by mouth daily.    Marland Kitchen. doxycycline (VIBRAMYCIN) 100 MG capsule Take 1 capsule (100 mg total) by mouth 2 (two) times daily. 14 capsule 0  . gabapentin (NEURONTIN) 800 MG tablet Take 800 mg by mouth 4 (four) times daily.    Marland Kitchen. ibuprofen (ADVIL,MOTRIN) 200 MG tablet Take 200 mg by mouth every 6 (six) hours as needed.    . primidone (MYSOLINE) 50 MG tablet Take 150 mg by mouth  2 (two) times daily.    . propranolol (INDERAL) 40 MG tablet Take 40 mg by mouth daily.  5  . tamsulosin (FLOMAX) 0.4 MG CAPS capsule TAKE 1 CAPSULE (0.4 MG TOTAL) BY MOUTH AT BEDTIME. 30 capsule 3  . vitamin B-12 (CYANOCOBALAMIN) 1000 MCG tablet Take 1,000 mcg by mouth daily.     No current facility-administered medications on file prior to visit.     Allergies  Allergen Reactions  . Codeine Nausea And Vomiting    All Codeine Related Drugs     Family History  Problem Relation Age of Onset  . Heart disease Mother   . Congestive Heart Failure Mother [id number]       Deceased  . Alcoholism Father        Living  . Arthritis Father   . Diabetes Maternal Aunt   . Cancer Other        PGGM  . Breast cancer Maternal Aunt   . Lung cancer Maternal Uncle   . Heart disease Brother   . Heart attack Brother   . Congestive Heart Failure Brother 47       Deceased  . Stroke Maternal Aunt   . Emphysema Brother        #2  . Arthritis Sister        #1  .  Allergies Daughter   . Kidney Stones Daughter   . Gallbladder disease Daughter   . Migraines Daughter     Social History   Social History  . Marital status: Single    Spouse name: N/A  . Number of children: 3  . 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 Smoking status: Former Smoker    Packs/day: 2.00    Years: 30.00    Types: Cigarettes    Quit date: 11/20/2000  . Smokeless tobacco: Never Used  . Alcohol use No  . Drug use: No  . Sexual activity: Not Asked   Other Topics Concern  . None   Social History Narrative  . None   Review of Systems - See HPI.  All other ROS are negative.  BP 130/78   Pulse 80   Temp 97.9 F (36.6 C) (Oral)   Resp 14   Ht 6\' 3"  (1.905 m)   Wt (!) 322 lb (146.1 kg)   SpO2 95%   BMI 40.25 kg/m   Physical Exam  Constitutional: He is oriented to person, place, and time and well-developed, well-nourished, and in no distress.  HENT:    Head: Normocephalic and atraumatic.  Eyes: Conjunctivae are normal.  Neck: Neck supple.  Cardiovascular: Normal rate, regular rhythm, normal heart sounds and intact distal pulses.   Pulmonary/Chest: Effort normal and breath sounds normal. No respiratory distress. He has no wheezes. He has no rales. He exhibits no tenderness.  Neurological: He is alert and oriented to person, place, and time.  Skin: Skin is warm and dry.     Psychiatric: Affect normal.  Vitals reviewed.   Assessment/Plan: 1. Tick bite, initial encounter Improving. Finish course of Doxycycline. Supportive measures discussed. Patient to return if there is any worsening symptoms. Also discussed sings/symptoms of tick-borne illness.    Piedad Climes, PA-C

## 2017-05-07 DIAGNOSIS — G250 Essential tremor: Secondary | ICD-10-CM | POA: Diagnosis not present

## 2017-05-07 DIAGNOSIS — N200 Calculus of kidney: Secondary | ICD-10-CM | POA: Diagnosis not present

## 2017-05-29 NOTE — Telephone Encounter (Signed)
Patient has talked with the Billing department several times. [facility] because [facility] will not be [facility] for. He is too large for [facility].  To Dr. Mayford Knifeurner for alternative recommendations to MRI.

## 2017-05-29 NOTE — Telephone Encounter (Signed)
Please have precert dept make sure that they let the insurance Co know that patient [facility] due to a weight limit and see if insurance agrees to pay

## 2017-05-30 NOTE — Telephone Encounter (Signed)
Confirmed with the Billing department that everything possible was discussed with the insurance company in attempt to get MRI covered, including telling them [facility] is unable to accommodate due to patient's size. His insurance will not cover the MRI.

## 2017-05-30 NOTE — Telephone Encounter (Signed)
Please refer to our genetic counseling clinic to have genetic testing for HOCM

## 2017-05-31 ENCOUNTER — Encounter: Payer: Self-pay | Admitting: Physician Assistant

## 2017-05-31 ENCOUNTER — Ambulatory Visit (INDEPENDENT_AMBULATORY_CARE_PROVIDER_SITE_OTHER): Payer: Medicare HMO | Admitting: Physician Assistant

## 2017-05-31 VITALS — BP 130/80 | HR 50 | Temp 97.5°F | Resp 14 | Ht 75.0 in | Wt 313.0 lb

## 2017-05-31 DIAGNOSIS — J329 Chronic sinusitis, unspecified: Secondary | ICD-10-CM

## 2017-05-31 DIAGNOSIS — I421 Obstructive hypertrophic cardiomyopathy: Secondary | ICD-10-CM | POA: Diagnosis not present

## 2017-05-31 DIAGNOSIS — Z125 Encounter for screening for malignant neoplasm of prostate: Secondary | ICD-10-CM | POA: Diagnosis not present

## 2017-05-31 DIAGNOSIS — E669 Obesity, unspecified: Secondary | ICD-10-CM | POA: Diagnosis not present

## 2017-05-31 DIAGNOSIS — G4733 Obstructive sleep apnea (adult) (pediatric): Secondary | ICD-10-CM

## 2017-05-31 DIAGNOSIS — Z0000 Encounter for general adult medical examination without abnormal findings: Secondary | ICD-10-CM

## 2017-05-31 DIAGNOSIS — B9689 Other specified bacterial agents as the cause of diseases classified elsewhere: Secondary | ICD-10-CM | POA: Diagnosis not present

## 2017-05-31 DIAGNOSIS — I10 Essential (primary) hypertension: Secondary | ICD-10-CM | POA: Diagnosis not present

## 2017-05-31 DIAGNOSIS — R251 Tremor, unspecified: Secondary | ICD-10-CM | POA: Diagnosis not present

## 2017-05-31 DIAGNOSIS — K429 Umbilical hernia without obstruction or gangrene: Secondary | ICD-10-CM | POA: Diagnosis not present

## 2017-05-31 LAB — COMPREHENSIVE METABOLIC PANEL
ALT: 20 U/L (ref 0–53)
AST: 25 U/L (ref 0–37)
Albumin: 4.3 g/dL (ref 3.5–5.2)
Alkaline Phosphatase: 47 U/L (ref 39–117)
BUN: 15 mg/dL (ref 6–23)
CALCIUM: 9.8 mg/dL (ref 8.4–10.5)
CHLORIDE: 103 meq/L (ref 96–112)
CO2: 32 meq/L (ref 19–32)
Creatinine, Ser: 1 mg/dL (ref 0.40–1.50)
GFR: 79.59 mL/min (ref >60.00)
Glucose, Bld: 91 mg/dL (ref 70–99)
Potassium: 4.9 mEq/L (ref 3.5–5.1)
Sodium: 140 mEq/L (ref 135–145)
Total Bilirubin: 0.6 mg/dL (ref 0.2–1.2)
Total Protein: 7.2 g/dL (ref 6.0–8.3)

## 2017-05-31 LAB — URINALYSIS, ROUTINE W REFLEX MICROSCOPIC
BILIRUBIN URINE: NEGATIVE
Hgb urine dipstick: NEGATIVE
KETONES UR: NEGATIVE
LEUKOCYTES UA: NEGATIVE
Nitrite: NEGATIVE
PH: 5.5 (ref 5.0–8.0)
RBC / HPF: NONE SEEN (ref 0–?)
Specific Gravity, Urine: >=1.03 — AB (ref 1.000–1.030)
Total Protein, Urine: NEGATIVE
URINE GLUCOSE: NEGATIVE
Urobilinogen, UA: 0.2 (ref 0.0–1.0)

## 2017-05-31 LAB — LIPID PANEL
CHOLESTEROL: 197 mg/dL (ref 0–200)
HDL: 38.1 mg/dL — ABNORMAL LOW (ref >39.00)
LDL CALC: 139 mg/dL — AB (ref 0–99)
NonHDL: 158.41
Total CHOL/HDL Ratio: 5
Triglycerides: 97 mg/dL (ref 0.0–149.0)
VLDL: 19.4 mg/dL (ref 0.0–40.0)

## 2017-05-31 LAB — CBC
HCT: 44.3 % (ref 39.0–52.0)
HEMOGLOBIN: 15.2 g/dL (ref 13.0–17.0)
MCHC: 34.3 g/dL (ref 30.0–36.0)
MCV: 99 fl (ref 78.0–100.0)
PLATELETS: 166 10*3/uL (ref 150.0–400.0)
RBC: 4.47 Mil/uL (ref 4.22–5.81)
RDW: 13.1 % (ref 11.5–15.5)
WBC: 5.7 10*3/uL (ref 4.0–10.5)

## 2017-05-31 LAB — TSH: TSH: 1.68 u[IU]/mL (ref 0.35–4.50)

## 2017-05-31 LAB — HEMOGLOBIN A1C: Hgb A1c MFr Bld: 5.5 % (ref 4.6–6.5)

## 2017-05-31 LAB — PSA: PSA: 1.68 ng/mL (ref 0.10–4.00)

## 2017-05-31 NOTE — Telephone Encounter (Signed)
Patient agrees to see Dr. Jomarie LongsJoseph for genetics counseling as he will not pay out of pocket for MRI.  Message sent to scheduling to arrange appointment.   Patient also states he sent back his CPAP because his insurance was no longer going to cover it.  [facility] and [facility] would be covered. He understands the CPAP assistant will send orders (along with sleep study) to ensure patient gets CPAP machine. He was grateful for call and awaits follow-up from CPAP assistant.

## 2017-05-31 NOTE — Patient Instructions (Signed)
Please follow-up with Neurology as scheduled.  I am going to get records from them to review.  You will be contacted for assessment by General Surgery.  Reach back out to Dr. Radford Pax regarding CPAP supplies. I will also reach out to her.   Rx Augmentin.  Increase fluids.  Rest.  Saline nasal spray.  Probiotic.  Mucinex as directed.  Humidifier in bedroom.  Call or return to clinic if symptoms are not improving.   Preventive Care 65 Years and Older, Male Preventive care refers to lifestyle choices and visits with your health care provider that can promote health and wellness. What does preventive care include?  A yearly physical exam. This is also called an annual well check.  Dental exams once or twice a year.  Routine eye exams. Ask your health care provider how often you should have your eyes checked.  Personal lifestyle choices, including: ? Daily care of your teeth and gums. ? Regular physical activity. ? Eating a healthy diet. ? Avoiding tobacco and drug use. ? Limiting alcohol use. ? Practicing safe sex. ? Taking low doses of aspirin every day. ? Taking vitamin and mineral supplements as recommended by your health care provider. What happens during an annual well check? The services and screenings done by your health care provider during your annual well check will depend on your age, overall health, lifestyle risk factors, and family history of disease. Counseling Your health care provider may ask you questions about your:  Alcohol use.  Tobacco use.  Drug use.  Emotional well-being.  Home and relationship well-being.  Sexual activity.  Eating habits.  History of falls.  Memory and ability to understand (cognition).  Work and work Statistician.  Screening You may have the following tests or measurements:  Height, weight, and BMI.  Blood pressure.  Lipid and cholesterol levels. These may be checked every 5 years, or more frequently if you are over 45  years old.  Skin check.  Lung cancer screening. You may have this screening every year starting at age 41 if you have a 30-pack-year history of smoking and currently smoke or have quit within the past 15 years.  Fecal occult blood test (FOBT) of the stool. You may have this test every year starting at age 18.  Flexible sigmoidoscopy or colonoscopy. You may have a sigmoidoscopy every 5 years or a colonoscopy every 10 years starting at age 2.  Prostate cancer screening. Recommendations will vary depending on your family history and other risks.  Hepatitis C blood test.  Hepatitis B blood test.  Sexually transmitted disease (STD) testing.  Diabetes screening. This is done by checking your blood sugar (glucose) after you have not eaten for a while (fasting). You may have this done every 1-3 years.  Abdominal aortic aneurysm (AAA) screening. You may need this if you are a current or former smoker.  Osteoporosis. You may be screened starting at age 68 if you are at high risk.  Talk with your health care provider about your test results, treatment options, and if necessary, the need for more tests. Vaccines Your health care provider may recommend certain vaccines, such as:  Influenza vaccine. This is recommended every year.  Tetanus, diphtheria, and acellular pertussis (Tdap, Td) vaccine. You may need a Td booster every 10 years.  Varicella vaccine. You may need this if you have not been vaccinated.  Zoster vaccine. You may need this after age 1.  Measles, mumps, and rubella (MMR) vaccine. You may need at  least one dose of MMR if you were born in 1957 or later. You may also need a second dose.  Pneumococcal 13-valent conjugate (PCV13) vaccine. One dose is recommended after age 45.  Pneumococcal polysaccharide (PPSV23) vaccine. One dose is recommended after age 67.  Meningococcal vaccine. You may need this if you have certain conditions.  Hepatitis A vaccine. You may need this  if you have certain conditions or if you travel or work in places where you may be exposed to hepatitis A.  Hepatitis B vaccine. You may need this if you have certain conditions or if you travel or work in places where you may be exposed to hepatitis B.  Haemophilus influenzae type b (Hib) vaccine. You may need this if you have certain risk factors.  Talk to your health care provider about which screenings and vaccines you need and how often you need them. This information is not intended to replace advice given to you by your health care provider. Make sure you discuss any questions you have with your health care provider. Document Released: 12/03/2015 Document Revised: 07/26/2016 Document Reviewed: 09/07/2015 Elsevier Interactive Patient Education  2017 [employer].

## 2017-05-31 NOTE — Progress Notes (Signed)
Subjective:   Warren Baker is a 66 y.o. male who presents for a Welcome to Medicare exam.   Acute Concerns: - Patient endorses sinus pressure/pain, head congestion, chest congestion that he feels is secondary to post-nasal drip. Notes this is thick and yellow. Denies fever. Denies chest pain or SOB. Denies ear pain. Some facial pain noted. Has take OTC sinus medications and Mucinex with little relief in symptoms.   Chronic Issues: - Tremor -- Followed by Neurology. Is currently on a regimen of Primidone, Propranolol and Gabapentin. Was just seen last week where medications were increased. Notes mild improvement in symptoms. Has another follow-up scheduledin 3 months.   - BPH -- Taking Flomax 0.4 mg each evening with significant relief of symptoms. Nocturia now 0-1 per night. Denies urinary hesitancy.   - Obstructive Sleep Apnea -- Not currently wearing CPAP as directed by Dr. Mayford Knifeurner. Is in the process of getting a new machine due to insurance change.   - HOCM -- Followed by Cardiology, Dr. Mayford Knifeurner. Had Echocardiogram revealing mild apical HOCM with diastolic dysfunction. Cardiac MRI was ordered but seems patient never had done. Has plans to call Cardiology to get Cardiac MRI rescheduled  Review of Systems: Review of Systems  Constitutional: Negative for fever and weight loss.  HENT: Positive for congestion and sinus pain. Negative for ear discharge, ear pain, hearing loss and tinnitus.   Eyes: Negative for blurred vision, double vision, photophobia and pain.  Respiratory: Positive for cough. Negative for shortness of breath.   Cardiovascular: Negative for chest pain and palpitations.  Gastrointestinal: Negative for abdominal pain, blood in stool, constipation, diarrhea, heartburn, melena, nausea and vomiting.  Genitourinary: Negative for dysuria, flank pain, frequency, hematuria and urgency.  Musculoskeletal: Negative for falls.  Neurological: Negative for dizziness, loss of  consciousness and headaches.  Endo/Heme/Allergies: Negative for environmental allergies.  Psychiatric/Behavioral: Negative for depression, hallucinations, substance abuse and suicidal ideas. The patient is not nervous/anxious and does not have insomnia.     Objective:    Today's Vitals   05/31/17 0822  BP: 130/80  Pulse: (!) 50  Resp: 14  Temp: (!) 97.5 F (36.4 C)  TempSrc: Oral  SpO2: 96%  Weight: (!) 313 lb (142 kg)  Height: 6\' 3"  (1.905 m)   Body mass index is 39.12 kg/m.  Medications Outpatient Encounter Prescriptions as of 05/31/2017  Medication Sig  . albuterol (PROVENTIL HFA;VENTOLIN HFA) 108 (90 Base) MCG/ACT inhaler Inhale 2 puffs into the lungs every 6 (six) hours as needed for wheezing or shortness of breath.  . Ascorbic Acid (VITAMIN C) 1000 MG tablet Take 1,000 mg by mouth daily.  Marland Kitchen. aspirin 81 MG tablet Take 81 mg by mouth daily.  Marland Kitchen. azelastine (ASTELIN) 0.1 % nasal spray USE 2 SPRAYS IN EACH NOSTRILS TWICE A DAY AS DIRECTED  . Cetirizine HCl 10 MG CAPS Take 10 mg by mouth daily.  . Cholecalciferol (VITAMIN D-3) 1000 UNITS CAPS Take 1,000 Units by mouth daily.  Marland Kitchen. gabapentin (NEURONTIN) 800 MG tablet Take 800 mg by mouth 4 (four) times daily.  Marland Kitchen. ibuprofen (ADVIL,MOTRIN) 200 MG tablet Take 200 mg by mouth every 6 (six) hours as needed.  . primidone (MYSOLINE) 50 MG tablet Take 150 mg by mouth 2 (two) times daily.  . propranolol (INDERAL) 40 MG tablet Take 40 mg by mouth daily.  . tamsulosin (FLOMAX) 0.4 MG CAPS capsule TAKE 1 CAPSULE (0.4 MG TOTAL) BY MOUTH AT BEDTIME.  . vitamin B-12 (CYANOCOBALAMIN) 1000 MCG tablet Take 1,000  mcg by mouth daily.  . [DISCONTINUED] doxycycline (VIBRAMYCIN) 100 MG capsule Take 1 capsule (100 mg total) by mouth 2 (two) times daily.   No facility-administered encounter medications on file as of 05/31/2017.      History: Past Medical History:  Diagnosis Date  . Allergic rhinitis   . Arthritis   . Broken leg   . Carbon dioxide  poisoning   . Colon polyps   . Environmental allergies   . Excessive daytime sleepiness 07/13/2016  . GERD (gastroesophageal reflux disease)   . History of chicken pox   . 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 Kidney stones   . Obesity (BMI 30-39.9) 07/13/2016  . OSA (obstructive sleep apnea) 11/01/2016   Mild with AHI 11/hr  . Seasonal allergies   . Snoring 07/13/2016  . Tremors of nervous system    Past Surgical History:  Procedure Laterality Date  . TONSILLECTOMY AND ADENOIDECTOMY    . WISDOM TOOTH EXTRACTION      Family History  Problem Relation Age of Onset  . Heart disease Mother   . Congestive Heart Failure Mother 70       Deceased  . Alcoholism Father        Living  . Arthritis Father   . Diabetes Maternal Aunt   . Cancer Other        PGGM  . Breast cancer Maternal Aunt   . Lung cancer Maternal Uncle   . Heart disease Brother   . Heart attack Brother   . Congestive Heart Failure Brother 47       Deceased  . Stroke Maternal Aunt   . Emphysema Brother        #2  . Arthritis Sister        #1  . Allergies Daughter   . Kidney Stones Daughter   . Gallbladder disease Daughter   . Migraines Daughter    Social History   Occupational History  . [employer] Technician    Social History Main Topics  . Smoking status: Former Smoker    Packs/day: 2.00    Years: 30.00    Types: Cigarettes    Quit date: 11/20/2000  . Smokeless tobacco: Never Used  . Alcohol use No  . Drug use: No  . Sexual activity: Not on file   Tobacco Counseling Counseling given: Not Answered  Immunizations and Health Maintenance Immunization History  Administered Date(s) Administered  . Influenza,inj,Quad PF,36+ Mos 10/04/2015, 11/25/2016  . Tdap 05/30/2016   Health Maintenance Due  Topic Date Due  . Hepatitis C Screening  Sep 17, 1951  . PNA vac Low Risk Adult (1 of 2 - PCV13) 12/05/2016    Activities of Daily Living In  your present state of health, do you have any difficulty performing the following activities: 05/31/2017  Hearing? N  Vision? N  Difficulty concentrating or making decisions? N  Walking or climbing stairs? N  Dressing or bathing? N  Doing errands, shopping? N  Preparing Food and eating ? N  Using the Toilet? N  In the past six months, have you accidently leaked urine? N  Do you have problems with loss of bowel control? N  Managing your Medications? N  Managing your Finances? N  Housekeeping or managing your Housekeeping? N  Some recent data might be hidden   Physical Exam   Physical Exam  Constitutional: He is oriented to person, place, and time and well-developed, well-nourished, and  in no distress.  HENT:  Head: Normocephalic and atraumatic.  Right Ear: Tympanic membrane and external ear normal.  Left Ear: Tympanic membrane and external ear normal.  Nose: Mucosal edema and rhinorrhea present. Right sinus exhibits maxillary sinus tenderness and frontal sinus tenderness. Left sinus exhibits no maxillary sinus tenderness and no frontal sinus tenderness.  Mouth/Throat: No oropharyngeal exudate.  Eyes: Conjunctivae are normal.  Neck: Neck supple.  Cardiovascular: Regular rhythm, normal heart sounds and intact distal pulses.   Pulmonary/Chest: Effort normal and breath sounds normal. No respiratory distress. He has no wheezes. He has no rales. He exhibits no tenderness.  Abdominal: Soft. Bowel sounds are normal. He exhibits no distension. There is no tenderness. There is no rebound and no guarding. A hernia is present. Hernia confirmed positive in the umbilical area (8-9 cm in diameter. Reducible. Non-painful).  Neurological: He is alert and oriented to person, place, and time.  Skin: Skin is warm and dry. No rash noted.  Psychiatric: Affect normal.  Vitals reviewed.  Advanced Directives: Does Patient Have a Medical Advance Directive?: No. Reviewed Advance directives with the patient.  Forms given. He is to complete and bring in a copy once completed to be scanned into the EMR.    Assessment:    This is a routine wellness  examination for this patient.  Chronic issues were reviewed today -- see plan below. An acute bacterial sinusitis was diagnosed.    Dietary issues and exercise activities discussed:  Current Exercise Habits: The patient does not participate in regular exercise at present  Goals    None      Depression Screen PHQ 2/9 Scores 05/31/2017 02/07/2017 07/31/2015  PHQ - 2 Score 0 0 0  PHQ- 9 Score 0 - -     Fall Risk Fall Risk  05/31/2017  Falls in the past year? No  Number falls in past yr: -  Injury with Fall? -  Risk for fall due to : -  Risk for fall due to (comments): -    Cognitive Function MMSE - Mini Mental State Exam 05/31/2017  Orientation to time 5  Orientation to Place 5  Registration 3  Attention/ Calculation 3  Recall 1  Language- name 2 objects 2  Language- repeat 1  Language- follow 3 step command 3  Language- read & follow direction 1  Write a sentence 1  Copy design 1  Total score 26        Patient Care Team: Noel Journey as PCP - General (Family Medicine) Morene Antu, MD as Consulting Physician (Neurology) Abelina Bachelor, MD as Referring Physician (Family Medicine)     Plan:  (1) Depression screen negative.      Health Maintenance reviewed.      Preventive schedule discussed and handout given in AVS.      Will obtain fasting labs today.  (2) Tremor -- followed by Neurology. Continue care as directed by specialist. (3) BPH -- Doing well. Continue Flomax. Prostate cancer screening reviewed. Patient agrees to repeat PSA today. (4) OSA -- BP stable. Patient to contact Dr. Mayford Knife for new CPAP supplies as she is managing provider.  (5) HOCM -- BP stable. 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 Asymptomatic. This has been discussed multiple times in the past where medication is lowered only for neurology to increase  his BB dosage. Patient to call and schedule follow-up with Dr. Mayford Knife. Also needs to reschedule cardiac MRI. Encouraged patient to do this.   (6) Bacterial sinusitis --  Rx Augmentin.  Increase fluids.  Rest.  Saline nasal spray.  Probiotic.  Mucinex as directed.  Humidifier in bedroom.  Call or return to clinic if symptoms are not improving.  (7) Umbilical Hernia -- Quite large. Non-tender. Reducible. Referral to General Surgery placed for further assessment.     I have personally reviewed and noted the following in the patient's chart:   . Medical and social history . Use of alcohol, tobacco or illicit drugs  . Current medications and supplements . Functional ability and status . Nutritional status . Physical activity . Advanced directives . List of other physicians . Hospitalizations, surgeries, and ER visits in previous 12 months . Vitals . Screenings to include cognitive, depression, and falls . Referrals and appointments  In addition, I have reviewed and discussed with patient certain preventive protocols, quality metrics, and best practice recommendations. A written personalized care plan for preventive services as well as general preventive health recommendations were provided to patient.    Jaquari, Reckner [place], [place] 05/31/2017

## 2017-05-31 NOTE — Progress Notes (Signed)
Pre visit review using our clinic review tool, if applicable. No additional management support is needed unless otherwise documented below in the visit note. 

## 2017-06-01 ENCOUNTER — Other Ambulatory Visit: Payer: Self-pay | Admitting: Physician Assistant

## 2017-06-01 ENCOUNTER — Telehealth: Payer: Self-pay | Admitting: Physician Assistant

## 2017-06-01 DIAGNOSIS — E785 Hyperlipidemia, unspecified: Secondary | ICD-10-CM

## 2017-06-01 MED ORDER — AMOXICILLIN-POT CLAVULANATE 875-125 MG PO TABS: 1.0000 | ORAL_TABLET | Freq: Two times a day (BID) | ORAL | 0 refills | Status: DC

## 2017-06-01 MED ORDER — ATORVASTATIN CALCIUM 10 MG PO TABS: 10.0000 mg | ORAL_TABLET | Freq: Every day | ORAL | 1 refills | Status: DC

## 2017-06-01 NOTE — Telephone Encounter (Signed)
Patient's daughter calling to report rx for augmentin was supposed to have to send to pharmacy yesterday during ov.  The pharmacy has not received anything yet.  Please send rx to [facility] and notify daughter when this has been done.

## 2017-06-01 NOTE — Telephone Encounter (Signed)
Patient has been scheduled with Dr. Jomarie LongsJoseph 7/26.  Nina-please call patient once orders are faxed to [facility] to update.

## 2017-06-01 NOTE — Telephone Encounter (Signed)
Medication has been resent

## 2017-06-04 NOTE — Addendum Note (Signed)
Addended by: Reesa ChewJONES, Anil Havard G on: 06/04/2017 06:12 PM   Modules accepted: Orders

## 2017-06-05 ENCOUNTER — Telehealth: Payer: Self-pay | Admitting: *Deleted

## 2017-06-05 NOTE — Telephone Encounter (Addendum)
Per Dr Mayford Knifeurner, [facility] for [facility] at 11 cm H2O  Sleep study, office notes cpap order,physician order, demographics and insurance info sent to patients new DME  [facility]. Confirmation received for faxed paperwork Minerva Areola(Eric)

## 2017-06-05 NOTE — Telephone Encounter (Signed)
-----   Message from Quintella Reichertraci R Turner, MD sent at 06/04/2017  7:14 PM EDT ----- Regarding: RE: CPAP ORDER Yes Cpap at 11cm H2O  Traci Turner ----- Message ----- From: Reesa ChewJones, Akhilesh Sassone G, CMA Sent: 06/04/2017   6:14 PM To: Quintella Reichertraci R Turner, MD Subject: CPAP ORDER                                     Dr Mayford Knifeurner  Would you advise me to use the settings from his last CPAP 11cm H20?   Patient also states he sent back his CPAP because his insurance was no longer going to cover it.  [facility]. He understands the CPAP assistant will send orders (along with sleep study) to ensure patient gets CPAP machine. He was grateful for call and awaits follow-up from CPAP assistant.

## 2017-06-14 ENCOUNTER — Telehealth: Payer: Self-pay | Admitting: Genetic Counselor

## 2017-06-14 ENCOUNTER — Encounter: Payer: Self-pay | Admitting: Genetic Counselor

## 2017-06-14 ENCOUNTER — Ambulatory Visit: Payer: Medicare HMO | Admitting: Genetic Counselor

## 2017-06-14 NOTE — Telephone Encounter (Signed)
Letter sent to patient MyChart

## 2017-06-14 NOTE — Telephone Encounter (Signed)
New Message      Pt needs a paper stating he was at his appointment today, please email or my chart letter so he can print it out

## 2017-06-16 ENCOUNTER — Encounter: Payer: Self-pay | Admitting: Cardiology

## 2017-06-26 IMAGING — CR DG CHEST 2V
2 series · 2 of 2 positions shown · non-contrast
Comparison: None.

CLINICAL DATA: Cough and congestion for 6 weeks

EXAM:
CHEST  2 VIEW

[w chest pa]
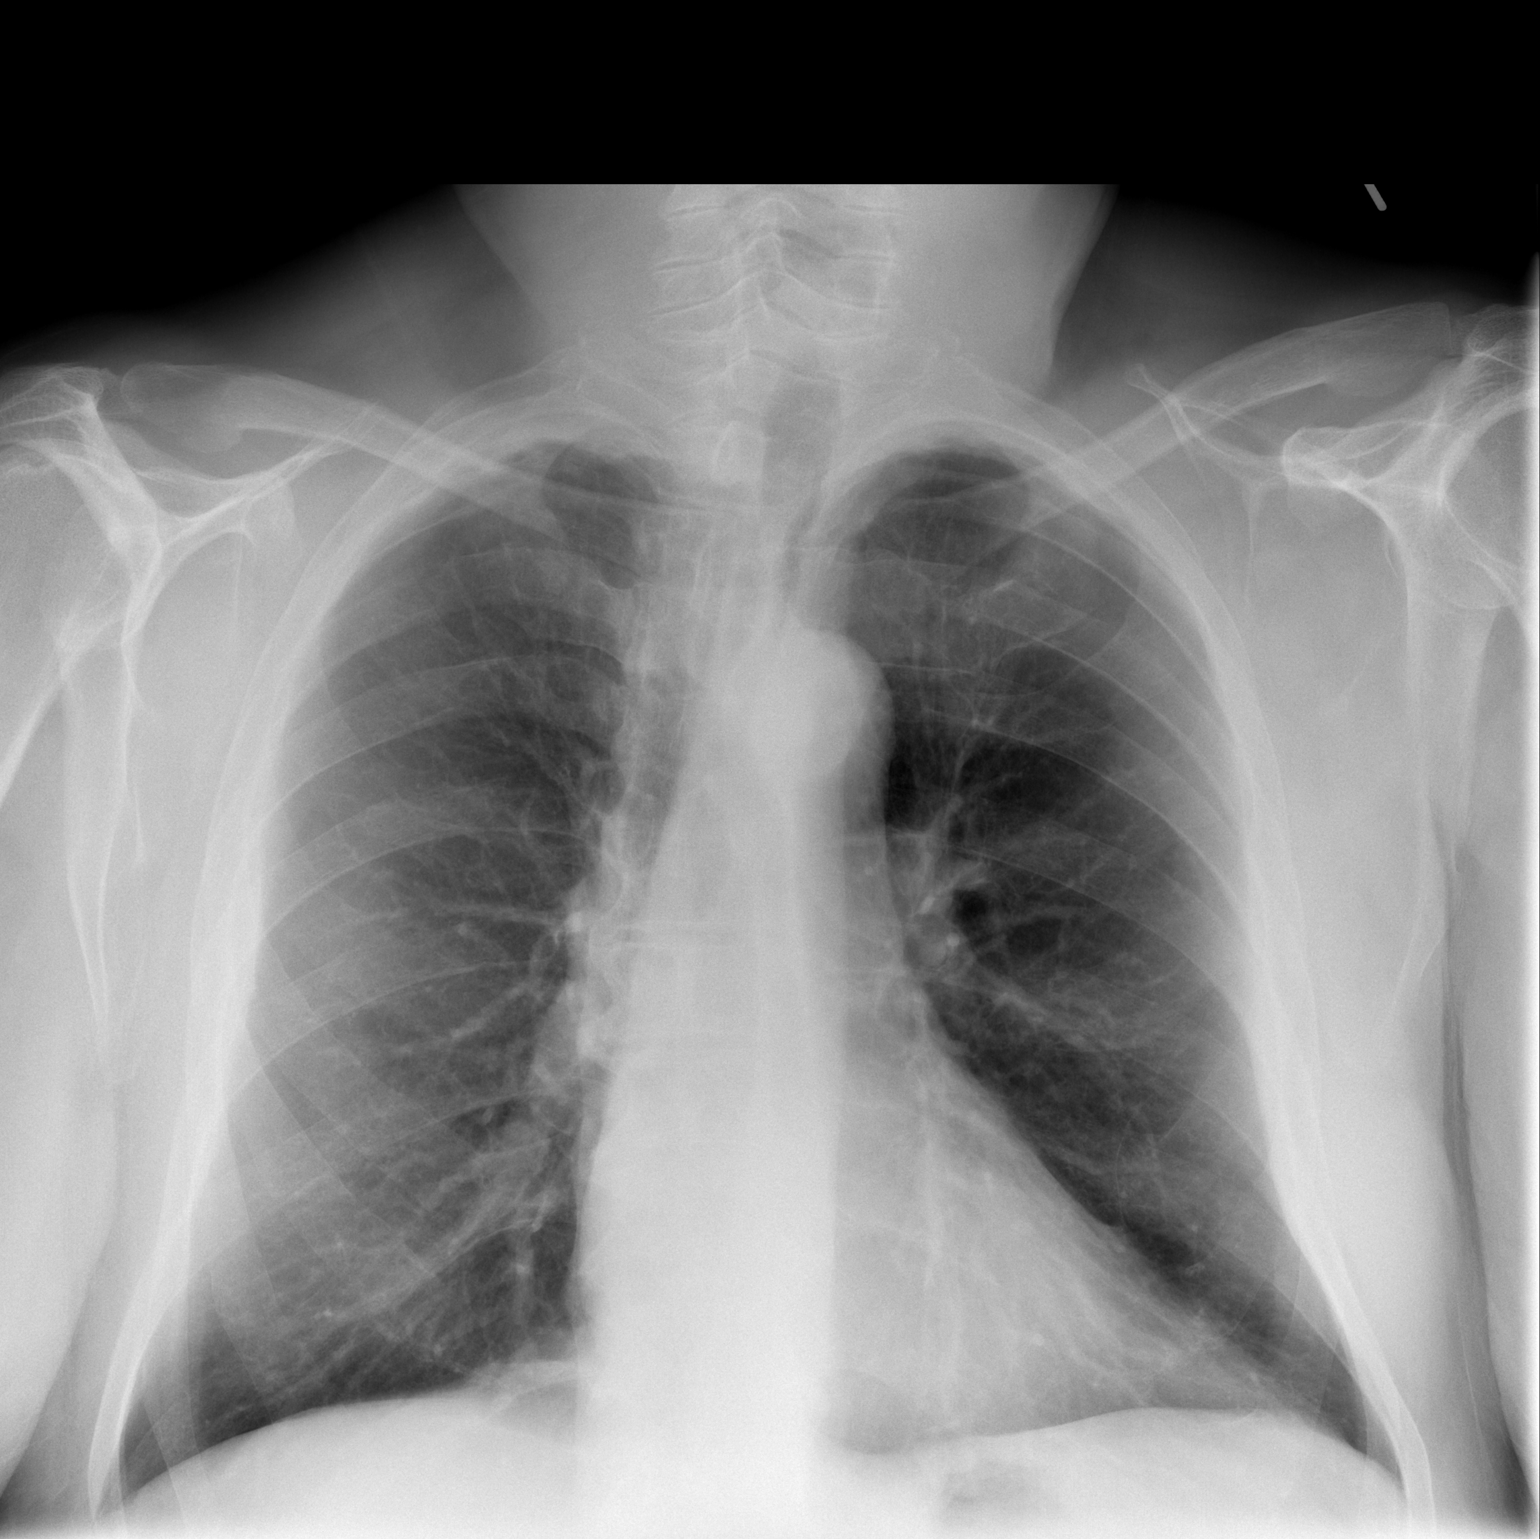

[w chest lat]
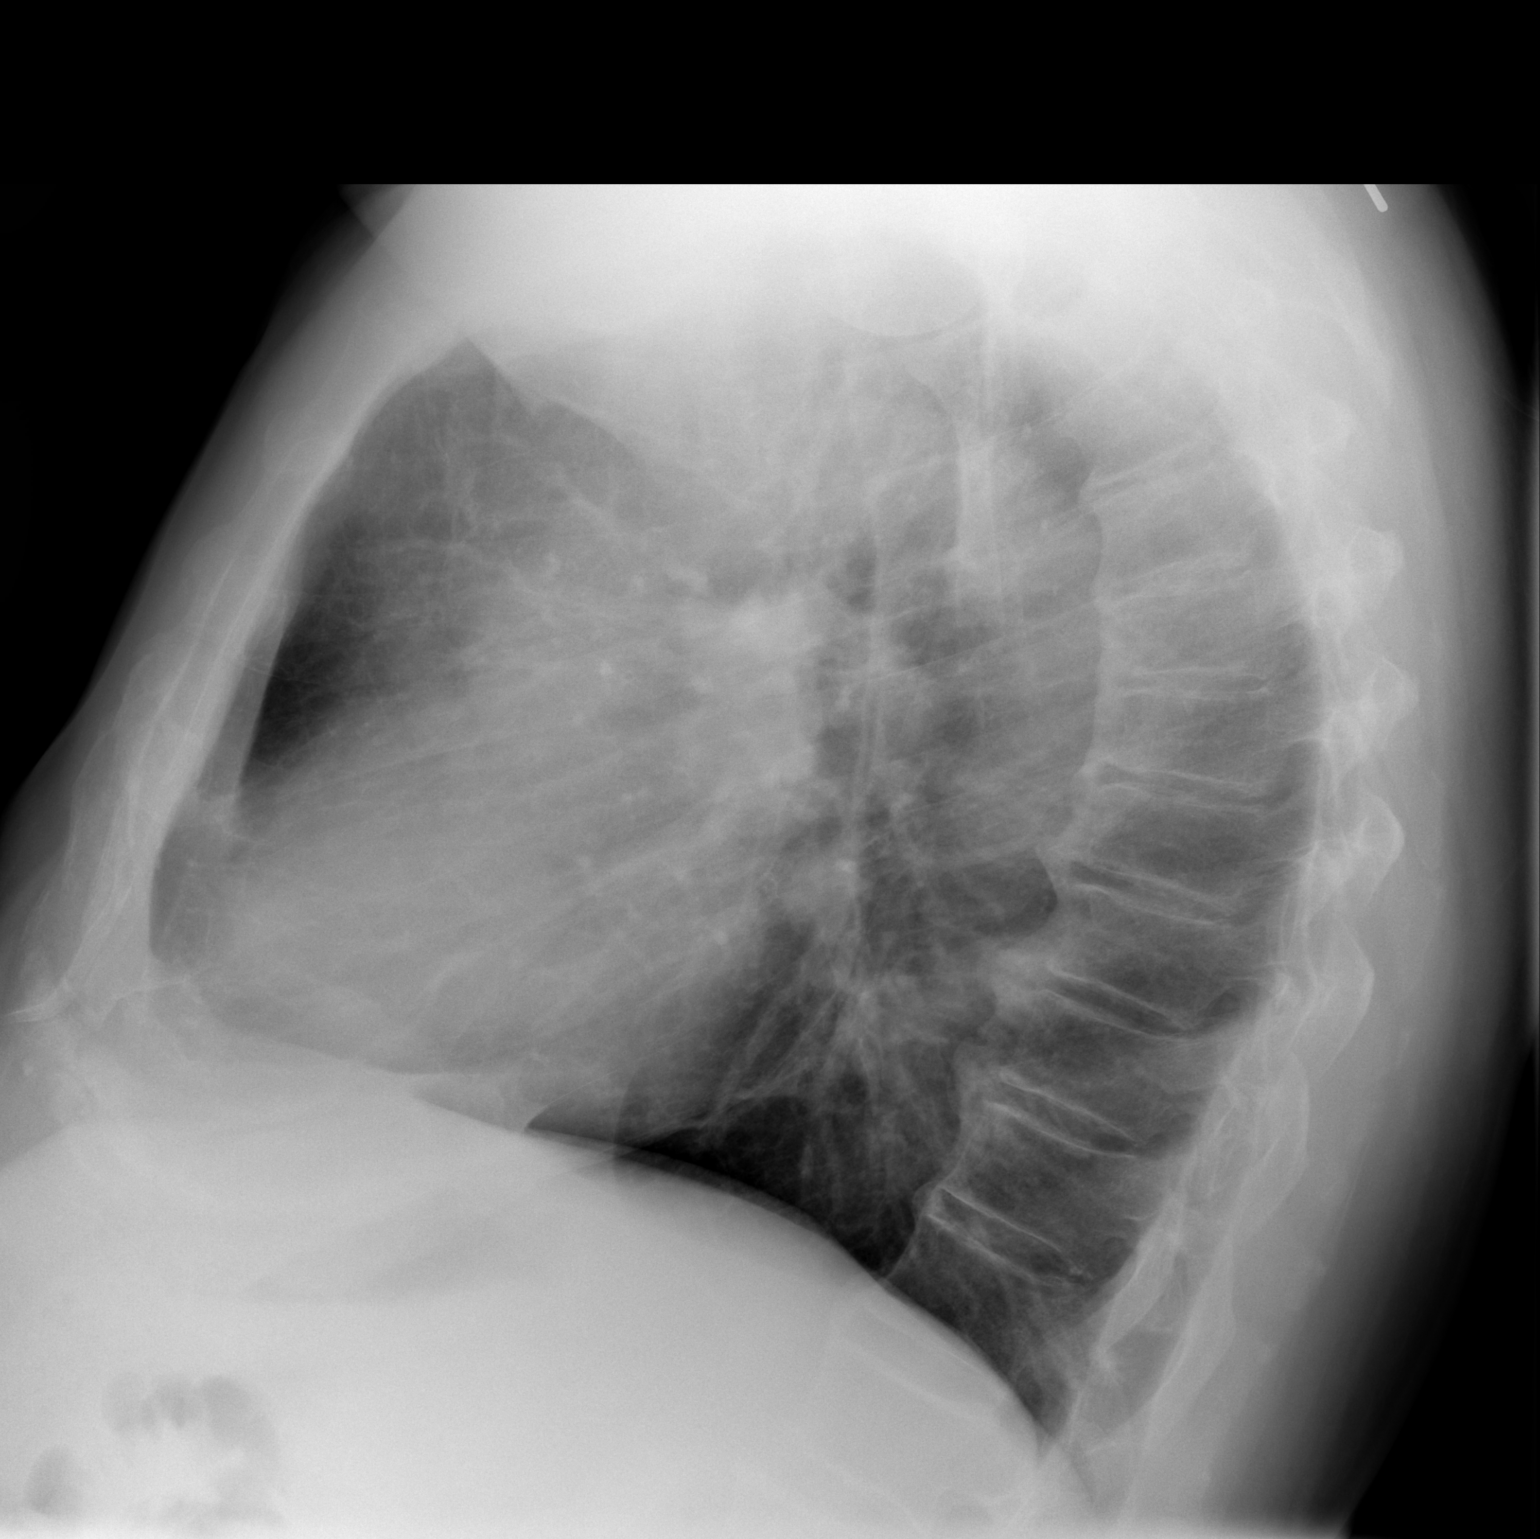

[2 of 2 positions shown; findings below may reference images not displayed]

FINDINGS: There is no edema or consolidation. Heart size and pulmonary
vascularity are normal. No adenopathy. There is degenerative change
in the thoracic spine.
IMPRESSION: No edema or consolidation.

## 2017-06-26 NOTE — Consult Note (Signed)
Pre-test GC notes  Warren Baker was referred for genetic testing of hypertrophic cardiomyopathy. He was counseled on the genetics of hypertrophic cardiomyopathy (HCM), namely inheritance, incomplete penetrance, variable expression and digenic/compound mutations that can be seen in some patients with HCM. We also briefly discussed the infiltrative cardiomyopathies, their inheritance pattern and treatment plans. We walked through the process of genetic testing. The potential outcomes of genetic testing and subsequent management of at-risk family members were discussed so as to manage expectations. His medical and 5-generation family history was obtained. See details below  Personal Medical Information Warren Baker (III.3 on pedigree) is a pleasant 66 year old gentleman. He was recently diagnosed with HCM when his primary care provider saw him for shortness of breath. He was then referred to Dr. Mayford Knifeurner who upon imaging identified Warren Baker was having apical hypertrophy. He reports having no major limitations other than some fatigue, occasional dyspnea upon exertion and some heart palpitations. He informs me that he does not have hypertension.   Family history Warren Baker (III.3) is the middle sibling of six siblings. His younger brother (III.5) had a massive heart attack at age 65 and had an ICD placed at that time. At age 65, he had a mild heart attack on the 4th of July and was taken to the hospital where he died from a massive heart attack. He reportedly had congestive heart failure and cause of death was identified as pulmonary embolism. Warren Baker is not in contact with his brother's kids and is not aware of their current health status.  Warren Baker has three relatively health children, ages 8837, 7035 and 7829 (IV.1-IV.3). His eldest daughter has two healthy boys (V.1-V.2). While David's dad (II.11) reportedly did not have heart disease, his mom (II.12) had corornary heart disease. His maternal uncle (II.13) is suspected to  have had sudden cardiac death. He tells me that his uncle was on his way to the doctor as he did not feel good and died in the car. An autopsy was not performed to confirm the cause of death.   He reports his maternal grandfather (1.3) having congestive heart failure and passing away in his sixties and a paternal uncle (ii.1) who died of a heart attack in his fifties. All other family members in the first two generations passed away from other conditions.  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 Additionally, he reports other family members with heart disease, mainly in his maternal lineage with one report of sudden death.   In light of his disease presentation and family history of heart disease, it may be worth considering HCM genetic testing to confirm his diagnosis. However, the yield of testing may be low, considering his advanced age of presentation. This test includes genes that encode components of the sarcomeric contractile apparatus and HCM phenocopies. Genetic testing will confirm the diagnosis and identify the genetic basis of the disease. It will also help identify first-degree family members (siblings and children) that may harbor the mutation. As HCM is an autosomal dominant disorder, his children are at a 50% risk of inheriting his condition. Genetic testing his children for the familial pathogenic variant will help stratify risk for HCM in the family. Appropriate cardiology follow-up and lifestyle management can then be directed to genotype-positive family members.   We discussed insurance coverage for genetic testing. Warren Baker has Medicare and his insurance will not cover genetic testing. I recommended that he discuss this with his family members and we can revisit genetic testing  at a later time. Meanwhile, he was advised to inform his children to obtain appropriate cardiology evaluation as they are his first-degree relatives                                                                                                                                                                                                                                                                        Sidney Ace, Ph.D, [facility]

## 2017-06-30 DIAGNOSIS — R05 Cough: Secondary | ICD-10-CM | POA: Diagnosis not present

## 2017-06-30 DIAGNOSIS — R0602 Shortness of breath: Secondary | ICD-10-CM | POA: Diagnosis not present

## 2017-06-30 DIAGNOSIS — J0100 Acute maxillary sinusitis, unspecified: Secondary | ICD-10-CM | POA: Diagnosis not present

## 2017-06-30 DIAGNOSIS — R0600 Dyspnea, unspecified: Secondary | ICD-10-CM | POA: Diagnosis not present

## 2017-07-03 ENCOUNTER — Telehealth: Payer: Self-pay | Admitting: Cardiology

## 2017-07-03 NOTE — Telephone Encounter (Addendum)
Reached out to the patient to make sure he was able to get set up with his CPAP and was informed by the patient that he never got his CPAP. [facility] and spoke to [place] who she states the patient needs a recent face to face visit with Dr Mayford Knifeurner and then they can move forward with setting him up. Reached out to the patient to schedule an appointment lmtcb.

## 2017-07-03 NOTE — Telephone Encounter (Signed)
[facility] ([facility]) says the patient needs a recent office visit to be able to get set up with a cpap. Patient has an appointment for 08/06/17 at 11:40. Patient agrees with treatment. 

## 2017-07-03 NOTE — Telephone Encounter (Signed)
Patient states he was returning a call to [place].

## 2017-07-03 NOTE — Telephone Encounter (Signed)
New Message  Pt call requesting to speak with RN. Pt states he was returning call. Please call back to discuss

## 2017-07-03 NOTE — Telephone Encounter (Signed)
[facility] Warren Baker(Warren Baker) says the patient needs a recent office visit to be able to get set up with a cpap. Patient has an appointment for 08/06/17 at 11:40. Patient agrees with treatment.

## 2017-07-05 ENCOUNTER — Other Ambulatory Visit: Payer: Self-pay | Admitting: Physician Assistant

## 2017-07-07 ENCOUNTER — Other Ambulatory Visit: Payer: Self-pay

## 2017-07-07 ENCOUNTER — Encounter (HOSPITAL_COMMUNITY): Payer: Self-pay | Admitting: Emergency Medicine

## 2017-07-07 ENCOUNTER — Inpatient Hospital Stay (HOSPITAL_COMMUNITY)
Admission: EM | Admit: 2017-07-07 | Discharge: 2017-07-09 | DRG: 190 | Disposition: A | Discharge status: Home / Self Care | Payer: Medicare HMO | Attending: Family Medicine | Admitting: Family Medicine

## 2017-07-07 ENCOUNTER — Emergency Department (HOSPITAL_COMMUNITY): Payer: Medicare HMO

## 2017-07-07 DIAGNOSIS — Z79899 Other long term (current) drug therapy: Secondary | ICD-10-CM

## 2017-07-07 DIAGNOSIS — J189 Pneumonia, unspecified organism: Secondary | ICD-10-CM | POA: Diagnosis present

## 2017-07-07 DIAGNOSIS — Z87891 Personal history of nicotine dependence: Secondary | ICD-10-CM

## 2017-07-07 DIAGNOSIS — I10 Essential (primary) hypertension: Secondary | ICD-10-CM | POA: Diagnosis present

## 2017-07-07 DIAGNOSIS — K219 Gastro-esophageal reflux disease without esophagitis: Secondary | ICD-10-CM | POA: Diagnosis present

## 2017-07-07 DIAGNOSIS — E871 Hypo-osmolality and hyponatremia: Secondary | ICD-10-CM | POA: Diagnosis not present

## 2017-07-07 DIAGNOSIS — R7989 Other specified abnormal findings of blood chemistry: Secondary | ICD-10-CM

## 2017-07-07 DIAGNOSIS — J209 Acute bronchitis, unspecified: Secondary | ICD-10-CM | POA: Diagnosis present

## 2017-07-07 DIAGNOSIS — D72829 Elevated white blood cell count, unspecified: Secondary | ICD-10-CM | POA: Diagnosis not present

## 2017-07-07 DIAGNOSIS — R778 Other specified abnormalities of plasma proteins: Secondary | ICD-10-CM | POA: Diagnosis present

## 2017-07-07 DIAGNOSIS — E785 Hyperlipidemia, unspecified: Secondary | ICD-10-CM | POA: Diagnosis present

## 2017-07-07 DIAGNOSIS — R748 Abnormal levels of other serum enzymes: Secondary | ICD-10-CM | POA: Diagnosis not present

## 2017-07-07 DIAGNOSIS — Z86010 Personal history of colonic polyps: Secondary | ICD-10-CM | POA: Diagnosis not present

## 2017-07-07 DIAGNOSIS — I421 Obstructive hypertrophic cardiomyopathy: Secondary | ICD-10-CM | POA: Diagnosis present

## 2017-07-07 DIAGNOSIS — T380X5A Adverse effect of glucocorticoids and synthetic analogues, initial encounter: Secondary | ICD-10-CM | POA: Diagnosis present

## 2017-07-07 DIAGNOSIS — Z7982 Long term (current) use of aspirin: Secondary | ICD-10-CM | POA: Diagnosis not present

## 2017-07-07 DIAGNOSIS — J441 Chronic obstructive pulmonary disease with (acute) exacerbation: Secondary | ICD-10-CM | POA: Diagnosis not present

## 2017-07-07 DIAGNOSIS — Z885 Allergy status to narcotic agent status: Secondary | ICD-10-CM | POA: Diagnosis not present

## 2017-07-07 DIAGNOSIS — E669 Obesity, unspecified: Secondary | ICD-10-CM | POA: Diagnosis present

## 2017-07-07 DIAGNOSIS — J440 Chronic obstructive pulmonary disease with acute lower respiratory infection: Principal | ICD-10-CM | POA: Diagnosis present

## 2017-07-07 DIAGNOSIS — R739 Hyperglycemia, unspecified: Secondary | ICD-10-CM | POA: Diagnosis present

## 2017-07-07 DIAGNOSIS — G4733 Obstructive sleep apnea (adult) (pediatric): Secondary | ICD-10-CM | POA: Diagnosis present

## 2017-07-07 DIAGNOSIS — J302 Other seasonal allergic rhinitis: Secondary | ICD-10-CM | POA: Diagnosis present

## 2017-07-07 DIAGNOSIS — N400 Enlarged prostate without lower urinary tract symptoms: Secondary | ICD-10-CM | POA: Diagnosis present

## 2017-07-07 DIAGNOSIS — I248 Other forms of acute ischemic heart disease: Secondary | ICD-10-CM | POA: Diagnosis not present

## 2017-07-07 DIAGNOSIS — Z6837 Body mass index (BMI) 37.0-37.9, adult: Secondary | ICD-10-CM

## 2017-07-07 DIAGNOSIS — R0602 Shortness of breath: Secondary | ICD-10-CM | POA: Diagnosis not present

## 2017-07-07 LAB — COMPREHENSIVE METABOLIC PANEL
ALBUMIN: 3.8 g/dL (ref 3.5–5.0)
ALK PHOS: 48 U/L (ref 38–126)
ALT: 30 U/L (ref 17–63)
AST: 28 U/L (ref 15–41)
Anion gap: 9 (ref 5–15)
BILIRUBIN TOTAL: 0.5 mg/dL (ref 0.3–1.2)
BUN: 15 mg/dL (ref 6–20)
CALCIUM: 9.4 mg/dL (ref 8.9–10.3)
CO2: 28 mmol/L (ref 22–32)
Chloride: 99 mmol/L — ABNORMAL LOW (ref 101–111)
Creatinine, Ser: 1.07 mg/dL (ref 0.61–1.24)
GFR calc Af Amer: >60 mL/min (ref >60)
GFR calc non Af Amer: >60 mL/min (ref >60)
GLUCOSE: 113 mg/dL — AB (ref 65–99)
Potassium: 4.4 mmol/L (ref 3.5–5.1)
Sodium: 136 mmol/L (ref 135–145)
TOTAL PROTEIN: 7.6 g/dL (ref 6.5–8.1)

## 2017-07-07 LAB — CBC WITH DIFFERENTIAL/PLATELET
BASOS PCT: 1 %
Basophils Absolute: 0.1 10*3/uL (ref 0.0–0.1)
EOS ABS: 3.5 10*3/uL — AB (ref 0.0–0.7)
Eosinophils Relative: 21 %
HEMATOCRIT: 43.7 % (ref 39.0–52.0)
Hemoglobin: 14.9 g/dL (ref 13.0–17.0)
LYMPHS ABS: 3.4 10*3/uL (ref 0.7–4.0)
Lymphocytes Relative: 20 %
MCH: 33.4 pg (ref 26.0–34.0)
MCHC: 34.1 g/dL (ref 30.0–36.0)
MCV: 98 fL (ref 78.0–100.0)
MONO ABS: 1.4 10*3/uL — AB (ref 0.1–1.0)
MONOS PCT: 8 %
NEUTROS ABS: 8.2 10*3/uL — AB (ref 1.7–7.7)
Neutrophils Relative %: 50 %
Platelets: 237 10*3/uL (ref 150–400)
RBC: 4.46 MIL/uL (ref 4.22–5.81)
RDW: 12.1 % (ref 11.5–15.5)
WBC: 16.5 10*3/uL — ABNORMAL HIGH (ref 4.0–10.5)

## 2017-07-07 LAB — I-STAT CG4 LACTIC ACID, ED
Lactic Acid, Venous: 0.6 mmol/L (ref 0.5–1.9)
Lactic Acid, Venous: 1.41 mmol/L (ref 0.5–1.9)

## 2017-07-07 LAB — TROPONIN I: Troponin I: 0.06 ng/mL (ref <0.03)

## 2017-07-07 LAB — URINALYSIS, ROUTINE W REFLEX MICROSCOPIC
Bilirubin Urine: NEGATIVE
GLUCOSE, UA: NEGATIVE mg/dL
HGB URINE DIPSTICK: NEGATIVE
Ketones, ur: NEGATIVE mg/dL
Leukocytes, UA: NEGATIVE
Nitrite: NEGATIVE
PH: 7 (ref 5.0–8.0)
PROTEIN: NEGATIVE mg/dL
Specific Gravity, Urine: 1.021 (ref 1.005–1.030)

## 2017-07-07 LAB — PROTIME-INR
INR: 1.18
Prothrombin Time: 15 seconds (ref 11.4–15.2)

## 2017-07-07 MED ORDER — DEXTROSE 5 % IV SOLN
500.0000 mg | Freq: Once | INTRAVENOUS | Status: AC
  Administered 2017-07-08: 500 mg via INTRAVENOUS
  Filled 2017-07-07: qty 500

## 2017-07-07 MED ORDER — METHYLPREDNISOLONE SODIUM SUCC 125 MG IJ SOLR
125.0000 mg | Freq: Once | INTRAMUSCULAR | Status: AC
  Administered 2017-07-07: 125 mg via INTRAVENOUS
  Filled 2017-07-07: qty 2

## 2017-07-07 MED ORDER — IPRATROPIUM-ALBUTEROL 0.5-2.5 (3) MG/3ML IN SOLN: 3.0000 mL | Freq: Once | RESPIRATORY_TRACT | Status: DC

## 2017-07-07 MED ORDER — CEFTRIAXONE SODIUM 1 G IJ SOLR
1.0000 g | Freq: Once | INTRAMUSCULAR | Status: AC
  Administered 2017-07-08: 1 g via INTRAVENOUS
  Filled 2017-07-07: qty 10

## 2017-07-07 MED ORDER — IPRATROPIUM-ALBUTEROL 0.5-2.5 (3) MG/3ML IN SOLN
3.0000 mL | Freq: Once | RESPIRATORY_TRACT | Status: AC
  Administered 2017-07-07: 3 mL via RESPIRATORY_TRACT
  Filled 2017-07-07: qty 3

## 2017-07-07 MED ORDER — ALBUTEROL SULFATE (2.5 MG/3ML) 0.083% IN NEBU
2.5000 mg | INHALATION_SOLUTION | Freq: Once | RESPIRATORY_TRACT | Status: AC
  Administered 2017-07-08: 2.5 mg via RESPIRATORY_TRACT
  Filled 2017-07-07: qty 3

## 2017-07-07 NOTE — ED Triage Notes (Signed)
Pt presents with increased SOB and wheezing since last Saturday; pt states he was dx with pneumonia and given abx and has been taking them but today he had a rough day and sob was significantly worse; cheeks are red, tachypnic, audible wheezing

## 2017-07-07 NOTE — ED Notes (Signed)
Called main lab to run troponin test.

## 2017-07-07 NOTE — ED Provider Notes (Signed)
[facility]-EMERGENCY DEPT Provider Note   CSN: [id number] Arrival date & time: 07/07/17  2050     History   Chief Complaint Chief Complaint  Patient presents with  . Shortness of Breath  . Wheezing    HPI Warren Baker is a 66 y.o. male who presents with SOB. PMH significant for obesity, OSA, GERD, HOCM. He states that last week he went to urgent care and was diagnosed with pneumonia. He was discharged with azithromycin and inhalers which she has been using. He had been getting better over the week however today he started wheezing and had an increased productive cough. He denies any fever, chills, chest pain, lightheadedness, abdominal pain, nausea or vomiting. He is a former smoker and does not have hx of COPD or asthma. He has never been hospitalized for his breathing.   HPI  Past Medical History:  Diagnosis Date  . Allergic rhinitis   . Arthritis   . Broken leg   . Carbon dioxide poisoning   . Colon polyps   . Environmental allergies   . Excessive daytime sleepiness 07/13/2016  . GERD (gastroesophageal reflux disease)   . History of chicken pox   . 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 Kidney stones   . Obesity (BMI 30-39.9) 07/13/2016  . OSA (obstructive sleep apnea) 11/01/2016   Mild with AHI 11/hr  . Seasonal allergies   . Snoring 07/13/2016  . Tremors of nervous system     Patient Active Problem List   Diagnosis Date Noted  . OSA (obstructive sleep apnea) 11/01/2016  . HOCM (hypertrophic obstructive cardiomyopathy) (HCC)   . Snoring 07/13/2016  . Obesity (BMI 30-39.9) 07/13/2016  . Bradycardia 06/02/2016  . Prostate cancer screening 10/05/2015  . Encounter for immunization 10/05/2015  . Tremor 10/05/2015  . Seasonal allergies 10/05/2015  . Essential hypertension 01/15/2015    Past Surgical History:  Procedure Laterality Date  . TONSILLECTOMY AND ADENOIDECTOMY    . WISDOM TOOTH  EXTRACTION         Home Medications    Prior to Admission medications   Medication Sig Start Date End Date Taking? Authorizing Provider  albuterol (PROVENTIL HFA;VENTOLIN HFA) 108 (90 Base) MCG/ACT inhaler Inhale 2 puffs into the lungs every 6 (six) hours as needed for wheezing or shortness of breath. 08/15/16  Yes Waldon Merl, PA-C  amoxicillin-clavulanate (AUGMENTIN) 875-125 MG tablet Take 1 tablet by mouth 2 (two) times daily. 06/01/17  Yes Waldon Merl, PA-C  Ascorbic Acid (VITAMIN C) 1000 MG tablet Take 1,000 mg by mouth daily.   Yes [provider]  aspirin 81 MG tablet Take 81 mg by mouth daily.   Yes [provider]  azelastine (ASTELIN) 0.1 % nasal spray USE 2 SPRAYS IN EACH NOSTRILS TWICE A DAY AS DIRECTED 11/27/16  Yes Waldon Merl, PA-C  Cetirizine HCl 10 MG CAPS Take 10 mg by mouth daily.   Yes [provider]  Cholecalciferol (VITAMIN D-3) 1000 UNITS CAPS Take 1,000 Units by mouth daily.   Yes [provider]  gabapentin (NEURONTIN) 800 MG tablet Take 800 mg by mouth 5 (five) times daily.    Yes [provider]  ibuprofen (ADVIL,MOTRIN) 200 MG tablet Take 200 mg by mouth every 6 (six) hours as needed.   Yes [provider]  primidone (MYSOLINE) 50 MG tablet Take 150 mg by mouth 2 (two) times daily.   Yes [provider]  propranolol (INDERAL) 40 MG tablet Take 40 mg by mouth daily. 03/27/17  Yes [provider]  tamsulosin (FLOMAX) 0.4 MG CAPS capsule TAKE 1 CAPSULE (0.4 MG TOTAL) BY MOUTH AT BEDTIME. 07/05/17  Yes Waldon Merl, PA-C  vitamin B-12 (CYANOCOBALAMIN) 1000 MCG tablet Take 1,000 mcg by mouth daily.   Yes [provider]  atorvastatin (LIPITOR) 10 MG tablet Take 1 tablet (10 mg total) by mouth daily. 06/01/17   Waldon Merl, PA-C    Family History Family History  Problem Relation Age of Onset  . Heart disease Mother   . Congestive Heart Failure Mother 74        Deceased  . Alcoholism Father        Living  . Arthritis Father   . Diabetes Maternal Aunt   . Cancer Other        PGGM  . Breast cancer Maternal Aunt   . Lung cancer Maternal Uncle   . Heart disease Brother   . Heart attack Brother   . Congestive Heart Failure Brother 47       Deceased  . Stroke Maternal Aunt   . Emphysema Brother        #2  . Arthritis Sister        #1  . Allergies Daughter   . Kidney Stones Daughter   . Gallbladder disease Daughter   . Migraines Daughter     Social History Social History  Substance Use Topics  . Smoking status: Former Smoker    Packs/day: 2.00    Years: 30.00    Types: Cigarettes    Quit date: 11/20/2000  . Smokeless tobacco: Never Used  . Alcohol use No     Allergies   Codeine   Review of Systems Review of Systems  Constitutional: Negative for chills and fever.  Respiratory: Positive for cough, shortness of breath and wheezing.   Cardiovascular: Negative for chest pain.  Gastrointestinal: Negative for abdominal pain, nausea and vomiting.     Physical Exam Updated Vital Signs BP 109/65   Pulse 64   Temp 98.1 F (36.7 C) (Oral)   Resp 15   Ht 6\' 5"  (1.956 m)   Wt (!) 144.2 kg (318 lb)   SpO2 95%   BMI 37.71 kg/m   Physical Exam  Constitutional: He is oriented to person, place, and time. He appears well-developed and well-nourished. No distress.  Obese, calm, cooperative  HENT:  Head: Normocephalic and atraumatic.  Eyes: Conjunctivae are normal. Right eye exhibits no discharge. Left eye exhibits no discharge. No scleral icterus.  Neck: Normal range of motion.  Cardiovascular: Normal rate and regular rhythm.  Exam reveals no gallop and no friction rub.   No murmur heard. Pulmonary/Chest: Effort normal. No respiratory distress. He has wheezes (Diffuse wheezes). He has no rales. He exhibits no tenderness.  Abdominal: Soft. Bowel sounds are normal. He exhibits no distension and no mass. There is no tenderness. There  is no rebound and no guarding. A hernia (umbilical) is present.  Musculoskeletal:  Trace edema  Neurological: He is alert and oriented to person, place, and time.  Skin: Skin is warm and dry.  Psychiatric: He has a normal mood and affect. His behavior is normal.  Nursing note and vitals reviewed.    ED Treatments / Results  Labs (all labs ordered are listed, but only abnormal results are displayed) Labs Reviewed  COMPREHENSIVE METABOLIC PANEL - Abnormal; Notable for the following:  Result Value   Chloride 99 (*)    Glucose, Bld 113 (*)    All other components within normal limits  CBC WITH DIFFERENTIAL/PLATELET - Abnormal; Notable for the following:    WBC 16.5 (*)    Neutro Abs 8.2 (*)    Monocytes Absolute 1.4 (*)    Eosinophils Absolute 3.5 (*)    All other components within normal limits  CULTURE, BLOOD (ROUTINE X 2)  CULTURE, BLOOD (ROUTINE X 2)  PROTIME-INR  URINALYSIS, ROUTINE W REFLEX MICROSCOPIC  TROPONIN I  I-STAT CG4 LACTIC ACID, ED    EKG  EKG Interpretation None       Radiology Dg Chest 2 View  Result Date: 07/07/2017 CLINICAL DATA:  Shortness of breath and wheezing EXAM: CHEST  2 VIEW COMPARISON:  August 15, 2016 FINDINGS: Lungs are somewhat hyperexpanded. There is no edema or consolidation. Heart size and pulmonary vascularity are normal. No adenopathy. There is degenerative change in the thoracic spine. IMPRESSION: Lungs mildly hyperexpanded without edema or consolidation. Stable cardiac silhouette. Electronically Signed   By: Bretta Bang III M.D.   On: 07/07/2017 21:31    Procedures Procedures (including critical care time)  Medications Ordered in ED Medications  albuterol (PROVENTIL) (2.5 MG/3ML) 0.083% nebulizer solution 2.5 mg (not administered)  ipratropium-albuterol (DUONEB) 0.5-2.5 (3) MG/3ML nebulizer solution 3 mL (3 mLs Nebulization Given 07/07/17 2212)  methylPREDNISolone sodium succinate (SOLU-MEDROL) 125 mg/2 mL injection  125 mg (125 mg Intravenous Given 07/07/17 2235)     Initial Impression / Assessment and Plan / ED Course  I have reviewed the triage vital signs and the nursing notes.  Pertinent labs & imaging results that were available during my care of the patient were reviewed by me and considered in my medical decision making (see chart for details).  66 year old male presents with COPD exacerbation. He is hypertensive but otherwise vitals are normal. On initial exam he is wheezing diffusely. Will ordered Duoneb and steroids and reassess.  11:33 PM Patient still has decreased breath sounds and wheezing. CBC remarkable for leukocytosis of 16. CMP is unremarkable. Lactic acid is normal CXR shows hyperexpanded lungs without edema or consolidation. Will continue Azithromycin and add Ceftrixone as well as another neb.  Troponin is elevated at 0.06. He has no chest pain and symptoms are not consistent with ACS - likely demand ischemia. On re-eval, pt is still wheezing but feels better. Will add continuous albuterol neb. Spoke with Dr. Katrinka Blazing who will admit.   Final Clinical Impressions(s) / ED Diagnoses   Final diagnoses:  COPD exacerbation (HCC)  Elevated troponin    New Prescriptions New Prescriptions   No medications on file     Beryle Quant 07/08/17 1504    Margarita Grizzle, MD 07/08/17 2300

## 2017-07-08 ENCOUNTER — Other Ambulatory Visit: Payer: Self-pay

## 2017-07-08 DIAGNOSIS — J441 Chronic obstructive pulmonary disease with (acute) exacerbation: Secondary | ICD-10-CM | POA: Diagnosis present

## 2017-07-08 DIAGNOSIS — T380X5A Adverse effect of glucocorticoids and synthetic analogues, initial encounter: Secondary | ICD-10-CM | POA: Diagnosis present

## 2017-07-08 DIAGNOSIS — J189 Pneumonia, unspecified organism: Secondary | ICD-10-CM | POA: Diagnosis present

## 2017-07-08 DIAGNOSIS — Z7982 Long term (current) use of aspirin: Secondary | ICD-10-CM | POA: Diagnosis not present

## 2017-07-08 DIAGNOSIS — J209 Acute bronchitis, unspecified: Secondary | ICD-10-CM | POA: Diagnosis present

## 2017-07-08 DIAGNOSIS — I248 Other forms of acute ischemic heart disease: Secondary | ICD-10-CM | POA: Diagnosis present

## 2017-07-08 DIAGNOSIS — E669 Obesity, unspecified: Secondary | ICD-10-CM

## 2017-07-08 DIAGNOSIS — G4733 Obstructive sleep apnea (adult) (pediatric): Secondary | ICD-10-CM | POA: Diagnosis present

## 2017-07-08 DIAGNOSIS — D72829 Elevated white blood cell count, unspecified: Secondary | ICD-10-CM | POA: Diagnosis present

## 2017-07-08 DIAGNOSIS — Z86010 Personal history of colonic polyps: Secondary | ICD-10-CM | POA: Diagnosis not present

## 2017-07-08 DIAGNOSIS — J302 Other seasonal allergic rhinitis: Secondary | ICD-10-CM | POA: Diagnosis present

## 2017-07-08 DIAGNOSIS — Z79899 Other long term (current) drug therapy: Secondary | ICD-10-CM | POA: Diagnosis not present

## 2017-07-08 DIAGNOSIS — R7989 Other specified abnormal findings of blood chemistry: Secondary | ICD-10-CM

## 2017-07-08 DIAGNOSIS — Z885 Allergy status to narcotic agent status: Secondary | ICD-10-CM | POA: Diagnosis not present

## 2017-07-08 DIAGNOSIS — J440 Chronic obstructive pulmonary disease with acute lower respiratory infection: Secondary | ICD-10-CM | POA: Diagnosis present

## 2017-07-08 DIAGNOSIS — R748 Abnormal levels of other serum enzymes: Secondary | ICD-10-CM | POA: Diagnosis present

## 2017-07-08 DIAGNOSIS — N400 Enlarged prostate without lower urinary tract symptoms: Secondary | ICD-10-CM | POA: Diagnosis present

## 2017-07-08 DIAGNOSIS — R778 Other specified abnormalities of plasma proteins: Secondary | ICD-10-CM | POA: Diagnosis present

## 2017-07-08 DIAGNOSIS — E871 Hypo-osmolality and hyponatremia: Secondary | ICD-10-CM | POA: Diagnosis present

## 2017-07-08 DIAGNOSIS — E785 Hyperlipidemia, unspecified: Secondary | ICD-10-CM | POA: Diagnosis present

## 2017-07-08 DIAGNOSIS — I10 Essential (primary) hypertension: Secondary | ICD-10-CM | POA: Diagnosis present

## 2017-07-08 DIAGNOSIS — I421 Obstructive hypertrophic cardiomyopathy: Secondary | ICD-10-CM | POA: Diagnosis present

## 2017-07-08 DIAGNOSIS — Z6837 Body mass index (BMI) 37.0-37.9, adult: Secondary | ICD-10-CM | POA: Diagnosis not present

## 2017-07-08 DIAGNOSIS — Z87891 Personal history of nicotine dependence: Secondary | ICD-10-CM | POA: Diagnosis not present

## 2017-07-08 DIAGNOSIS — R739 Hyperglycemia, unspecified: Secondary | ICD-10-CM | POA: Diagnosis present

## 2017-07-08 DIAGNOSIS — K219 Gastro-esophageal reflux disease without esophagitis: Secondary | ICD-10-CM | POA: Diagnosis present

## 2017-07-08 LAB — BRAIN NATRIURETIC PEPTIDE: B NATRIURETIC PEPTIDE 5: 71.3 pg/mL (ref 0.0–100.0)

## 2017-07-08 LAB — CBC
HEMATOCRIT: 43.7 % (ref 39.0–52.0)
Hemoglobin: 15.2 g/dL (ref 13.0–17.0)
MCH: 34.2 pg — ABNORMAL HIGH (ref 26.0–34.0)
MCHC: 34.8 g/dL (ref 30.0–36.0)
MCV: 98.4 fL (ref 78.0–100.0)
Platelets: 129 10*3/uL — ABNORMAL LOW (ref 150–400)
RBC: 4.44 MIL/uL (ref 4.22–5.81)
RDW: 12.4 % (ref 11.5–15.5)
WBC: 11.4 10*3/uL — AB (ref 4.0–10.5)

## 2017-07-08 LAB — HIV ANTIBODY (ROUTINE TESTING W REFLEX): HIV Screen 4th Generation wRfx: NONREACTIVE

## 2017-07-08 LAB — TROPONIN I
TROPONIN I: 0.05 ng/mL — AB (ref <0.03)
Troponin I: 0.03 ng/mL (ref <0.03)

## 2017-07-08 LAB — BASIC METABOLIC PANEL
ANION GAP: 10 (ref 5–15)
BUN: 15 mg/dL (ref 6–20)
CHLORIDE: 99 mmol/L — AB (ref 101–111)
CO2: 23 mmol/L (ref 22–32)
CREATININE: 1.06 mg/dL (ref 0.61–1.24)
Calcium: 9.1 mg/dL (ref 8.9–10.3)
GFR calc Af Amer: >60 mL/min (ref >60)
GFR calc non Af Amer: >60 mL/min (ref >60)
Glucose, Bld: 162 mg/dL — ABNORMAL HIGH (ref 65–99)
Potassium: 4.7 mmol/L (ref 3.5–5.1)
Sodium: 132 mmol/L — ABNORMAL LOW (ref 135–145)

## 2017-07-08 LAB — STREP PNEUMONIAE URINARY ANTIGEN: Strep Pneumo Urinary Antigen: NEGATIVE

## 2017-07-08 MED ORDER — ONDANSETRON HCL 4 MG PO TABS: 4.0000 mg | ORAL_TABLET | Freq: Four times a day (QID) | ORAL | Status: DC | PRN

## 2017-07-08 MED ORDER — IPRATROPIUM-ALBUTEROL 0.5-2.5 (3) MG/3ML IN SOLN
3.0000 mL | Freq: Three times a day (TID) | RESPIRATORY_TRACT | Status: DC
  Administered 2017-07-08 – 2017-07-09 (×3): 3 mL via RESPIRATORY_TRACT
  Filled 2017-07-08 (×3): qty 3

## 2017-07-08 MED ORDER — ACETAMINOPHEN 325 MG PO TABS: 650.0000 mg | ORAL_TABLET | Freq: Four times a day (QID) | ORAL | Status: DC | PRN

## 2017-07-08 MED ORDER — ASPIRIN EC 81 MG PO TBEC
81.0000 mg | DELAYED_RELEASE_TABLET | Freq: Every day | ORAL | Status: DC
  Administered 2017-07-08 – 2017-07-09 (×2): 81 mg via ORAL
  Filled 2017-07-08 (×2): qty 1

## 2017-07-08 MED ORDER — GABAPENTIN 400 MG PO CAPS
800.0000 mg | ORAL_CAPSULE | Freq: Every day | ORAL | Status: DC
Start: 2017-07-08 — End: 2017-07-09
  Administered 2017-07-08 – 2017-07-09 (×8): 800 mg via ORAL
  Filled 2017-07-08 (×8): qty 2

## 2017-07-08 MED ORDER — TAMSULOSIN HCL 0.4 MG PO CAPS
0.4000 mg | ORAL_CAPSULE | Freq: Every day | ORAL | Status: DC
  Administered 2017-07-08: 0.4 mg via ORAL
  Filled 2017-07-08: qty 1

## 2017-07-08 MED ORDER — CEFTRIAXONE SODIUM 1 G IJ SOLR
1.0000 g | INTRAMUSCULAR | Status: DC
  Administered 2017-07-08: 1 g via INTRAVENOUS
  Filled 2017-07-08: qty 10

## 2017-07-08 MED ORDER — METHYLPREDNISOLONE SODIUM SUCC 125 MG IJ SOLR
60.0000 mg | Freq: Three times a day (TID) | INTRAMUSCULAR | Status: DC
  Administered 2017-07-08 – 2017-07-09 (×4): 60 mg via INTRAVENOUS
  Filled 2017-07-08 (×4): qty 2

## 2017-07-08 MED ORDER — ALBUTEROL (5 MG/ML) CONTINUOUS INHALATION SOLN
10.0000 mg/h | INHALATION_SOLUTION | Freq: Once | RESPIRATORY_TRACT | Status: AC
  Administered 2017-07-08: 10 mg/h via RESPIRATORY_TRACT
  Filled 2017-07-08: qty 20

## 2017-07-08 MED ORDER — ENOXAPARIN SODIUM 40 MG/0.4ML ~~LOC~~ SOLN
40.0000 mg | Freq: Every day | SUBCUTANEOUS | Status: DC
  Administered 2017-07-08 – 2017-07-09 (×2): 40 mg via SUBCUTANEOUS
  Filled 2017-07-08 (×3): qty 0.4

## 2017-07-08 MED ORDER — ONDANSETRON HCL 4 MG/2ML IJ SOLN
4.0000 mg | Freq: Four times a day (QID) | INTRAMUSCULAR | Status: DC | PRN
Start: 2017-07-08 — End: 2017-07-09

## 2017-07-08 MED ORDER — IPRATROPIUM-ALBUTEROL 0.5-2.5 (3) MG/3ML IN SOLN
3.0000 mL | Freq: Four times a day (QID) | RESPIRATORY_TRACT | Status: DC
  Administered 2017-07-08: 3 mL via RESPIRATORY_TRACT
  Filled 2017-07-08: qty 3

## 2017-07-08 MED ORDER — LORATADINE 10 MG PO TABS
10.0000 mg | ORAL_TABLET | Freq: Every day | ORAL | Status: DC
  Administered 2017-07-08 – 2017-07-09 (×2): 10 mg via ORAL
  Filled 2017-07-08 (×2): qty 1

## 2017-07-08 MED ORDER — PROPRANOLOL HCL 40 MG PO TABS
40.0000 mg | ORAL_TABLET | Freq: Every day | ORAL | Status: DC
  Administered 2017-07-08 – 2017-07-09 (×2): 40 mg via ORAL
  Filled 2017-07-08 (×3): qty 1

## 2017-07-08 MED ORDER — ACETAMINOPHEN 650 MG RE SUPP: 650.0000 mg | Freq: Four times a day (QID) | RECTAL | Status: DC | PRN

## 2017-07-08 MED ORDER — IPRATROPIUM-ALBUTEROL 0.5-2.5 (3) MG/3ML IN SOLN
3.0000 mL | RESPIRATORY_TRACT | Status: DC | PRN
Start: 2017-07-08 — End: 2017-07-09

## 2017-07-08 MED ORDER — ASPIRIN 81 MG PO CHEW
324.0000 mg | CHEWABLE_TABLET | Freq: Once | ORAL | Status: AC
  Administered 2017-07-08: 324 mg via ORAL
  Filled 2017-07-08: qty 4

## 2017-07-08 MED ORDER — DEXTROSE 5 % IV SOLN
500.0000 mg | INTRAVENOUS | Status: DC
  Administered 2017-07-08: 500 mg via INTRAVENOUS
  Filled 2017-07-08: qty 500

## 2017-07-08 MED ORDER — ATORVASTATIN CALCIUM 10 MG PO TABS
10.0000 mg | ORAL_TABLET | Freq: Every day | ORAL | Status: DC
  Administered 2017-07-08 – 2017-07-09 (×2): 10 mg via ORAL
  Filled 2017-07-08 (×3): qty 1

## 2017-07-08 NOTE — ED Notes (Signed)
Pt falling asleep and oxygen sats dropping to 80s.  Pt wears CPAP at home.  Paged admitting MD and received order for CPAP at night. Respiratory notified.

## 2017-07-08 NOTE — Evaluation (Signed)
Physical Therapy Evaluation Patient Details Name: Warren Baker MRN: [id number] DOB: May 05, 1951 Today's Date: 07/08/2017   History of Present Illness  Warren Baker is a 66 y.o. male with medical history significant of HOCM, OSA, HTN, tremors, GERD, remote history of tobacco abuse, obesity; who presents with complaints of cough and shortness of breath 1 week.  Patient with COPD and bradycardia.  Clinical Impression  Patient is functioning at Independent level for mobility and gait.  Supervision only for stairs.  Provided education on energy conservation to minimize dyspnea.  Patient at baseline functional level.  No further acute PT needs identified - PT will sign off.    Follow Up Recommendations No PT follow up    Equipment Recommendations  None recommended by PT    Recommendations for Other Services       Precautions / Restrictions Precautions Precautions: None Restrictions Weight Bearing Restrictions: No      Mobility  Bed Mobility               General bed mobility comments: Patient in recliner as PT entered room.  Transfers Overall transfer level: Independent Equipment used: None                Ambulation/Gait Ambulation/Gait assistance: Independent Ambulation Distance (Feet): 220 Feet Assistive device: None Gait Pattern/deviations: WFL(Within Functional Limits)   Gait velocity interpretation: at or above normal speed for age/gender General Gait Details: Patient with fairly quick gait.  Encouraged patient to slow down for safety and energy conservation.  Stairs Stairs: Yes Stairs assistance: Supervision Stair Management: No rails;Alternating pattern;Forwards Number of Stairs: 4 General stair comments: No physical assist needed.  Supervision for safety only.  Again encouraged patient to move more slowly on stairs for safety.  Wheelchair Mobility    Modified Rankin (Stroke Patients Only)       Balance Overall balance assessment: Needs  assistance   Sitting balance-Leahy Scale: Good     Standing balance support: No upper extremity supported Standing balance-Leahy Scale: Good                               Pertinent Vitals/Pain Pain Assessment: No/denies pain    Home Living Family/patient expects to be discharged to:: Private residence Living Arrangements: Alone Available Help at Discharge: Family;Available PRN/intermittently (Or can go to daughter's home for several days.) Type of Home: House Home Access: Stairs to enter Entrance Stairs-Rails: Right;Left Entrance Stairs-Number of Steps: 5 Home Layout: One level Home Equipment: None      Prior Function Level of Independence: Independent         Comments: Works full time job.     Hand Dominance        Extremity/Trunk Assessment   Upper Extremity Assessment Upper Extremity Assessment: Overall WFL for tasks assessed    Lower Extremity Assessment Lower Extremity Assessment: Overall WFL for tasks assessed       Communication   Communication: No difficulties  Cognition Arousal/Alertness: Awake/alert Behavior During Therapy: WFL for tasks assessed/performed Overall Cognitive Status: Within Functional Limits for tasks assessed                                        General Comments      Exercises     Assessment/Plan    PT Assessment Patent does not need any further PT services  PT  Problem List         PT Treatment Interventions      PT Goals (Current goals can be found in the Care Plan section)  Acute Rehab PT Goals PT Goal Formulation: All assessment and education complete, DC therapy    Frequency     Barriers to discharge        Co-evaluation               AM-PAC PT "6 Clicks" Daily Activity  Outcome Measure Difficulty turning over in bed (including adjusting bedclothes, sheets and blankets)?: None Difficulty moving from lying on back to sitting on the side of the bed? : None Difficulty  sitting down on and standing up from a chair with arms (e.g., wheelchair, bedside commode, etc,.)?: None Help needed moving to and from a bed to chair (including a wheelchair)?: None Help needed walking in hospital room?: None Help needed climbing 3-5 steps with a railing? : A Little 6 Click Score: 23    End of Session Equipment Utilized During Treatment: Gait belt Activity Tolerance: Patient tolerated treatment well (Dyspnea 2/4) Patient left: in chair;with call bell/phone within reach;with family/visitor present Nurse Communication: Mobility status PT Visit Diagnosis: Other (comment) (Decreased activity tolerance.)    Time: 4098-1191 PT Time Calculation (min) (ACUTE ONLY): 13 min   Charges:   PT Evaluation $PT Eval Moderate Complexity: 1 Mod     PT G Codes:        Khalila Buechner H. Renaldo Fiddler, [facility] Acute Rehab Services Pager [phone]   Vena Austria 07/08/2017, 8:11 PM

## 2017-07-08 NOTE — ED Notes (Signed)
Sent label to main lab to add BNP

## 2017-07-08 NOTE — Progress Notes (Signed)
New pt admission from ED. Pt brought to the floor in stable condition. Vitals taken. Initial Assessment done. All immediate pertinent needs to patient addressed. Patient Guide given to patient. Important safety instructions relating to hospitalization reviewed with patient. Patient verbalized understanding. Will continue to monitor pt. 

## 2017-07-08 NOTE — ED Notes (Signed)
Warren Baker [phone] Pt's daughter

## 2017-07-08 NOTE — ED Notes (Signed)
Pt given incentive spirometer, instructed to deep breathe with it every hour.

## 2017-07-08 NOTE — Progress Notes (Signed)
Patient placed on CPAP without complication. Tolerating well at this time. RT will continue to monitor as needed.

## 2017-07-08 NOTE — ED Notes (Signed)
Critical lab notification troponin 0.03. MD paged

## 2017-07-08 NOTE — H&P (Addendum)
History and Physical    Warren Baker ZOX:[id number] DOB: December 08, 1950 DOA: 07/07/2017  Referring MD/NP/PA: Terance Hart, PA-C PCP: Waldon Merl, PA-C  Patient coming from: Home via EMS  Chief Complaint: Couldn't breathe  HPI: Warren Baker is a 66 y.o. male with medical history significant of HOCM, OSA, GERD, remote history of tobacco abuse, obesity; who presents with complaints of cough and shortness of breath 1 week. Patient went to a urgent care facility and reports being diagnosed with a pneumonia after chest x-ray. He was prescribed albuterol inhaler, Augmentin, and azithromycin. Patient reports taking medications as prescribed and has 2 days of Augmentin to take. He reports despite taking medications he still had a productive cough with yellow to brownish sputum and wheezing. Symptoms progressively worsened yesterday to the point he felt like he can breathe at all. Denies having any chest pain, leg swelling, fever, recent sick contacts, or steroid use. He quit smoking in 2000, but previously reports smoking approximately 30 years at least 2 packs of cigarettes per day on average.  ED Course: On admission into the emergency department patient was seen to be afebrile, pulse 55-68, Respiration 14-23, and all other vital signs maintained. Labs revealed WBC 16.5, troponin 0.06, lactic acid 1.41. Chest x-ray showed mild hyperinflation of lungs but no acute infiltrate.  Review of Systems: Review of Systems  Constitutional: Negative for chills and fever.  HENT: Negative for ear discharge and nosebleeds.   Eyes: Negative for pain and discharge.  Respiratory: Positive for cough, sputum production, shortness of breath and wheezing.   Cardiovascular: Negative for chest pain and leg swelling.  Gastrointestinal: Negative for blood in stool, nausea and vomiting.  Genitourinary: Negative for dysuria and hematuria.  Musculoskeletal: Negative for falls.  Skin: Negative for itching and rash.    Neurological: Negative for seizures and loss of consciousness.  Psychiatric/Behavioral: Negative for substance abuse. The patient does not have insomnia.     Past Medical History:  Diagnosis Date  . Allergic rhinitis   . Arthritis   . Broken leg   . Carbon dioxide poisoning   . Colon polyps   . Environmental allergies   . Excessive daytime sleepiness 07/13/2016  . GERD (gastroesophageal reflux disease)   . History of chicken pox   . 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 Kidney stones   . Obesity (BMI 30-39.9) 07/13/2016  . OSA (obstructive sleep apnea) 11/01/2016   Mild with AHI 11/hr  . Seasonal allergies   . Snoring 07/13/2016  . Tremors of nervous system     Past Surgical History:  Procedure Laterality Date  . TONSILLECTOMY AND ADENOIDECTOMY    . WISDOM TOOTH EXTRACTION       reports that he quit smoking about 16 years ago. His smoking use included Cigarettes. He has a 60.00 pack-year smoking history. He has never used smokeless tobacco. He reports that he does not drink alcohol or use drugs.  Allergies  Allergen Reactions  . Codeine Nausea And Vomiting    All Codeine Related Drugs     Family History  Problem Relation Age of Onset  . Heart disease Mother   . Congestive Heart Failure Mother 4       Deceased  . Alcoholism Father        Living  . Arthritis Father   . Diabetes Maternal Aunt   . Cancer Other        PGGM  .  Breast cancer Maternal Aunt   . Lung cancer Maternal Uncle   . Heart disease Brother   . Heart attack Brother   . Congestive Heart Failure Brother 47       Deceased  . Stroke Maternal Aunt   . Emphysema Brother        #2  . Arthritis Sister        #1  . Allergies Daughter   . Kidney Stones Daughter   . Gallbladder disease Daughter   . Migraines Daughter     Prior to Admission medications   Medication Sig Start Date End Date Taking? Authorizing Provider   albuterol (PROVENTIL HFA;VENTOLIN HFA) 108 (90 Base) MCG/ACT inhaler Inhale 2 puffs into the lungs every 6 (six) hours as needed for wheezing or shortness of breath. 08/15/16  Yes Waldon Merl, PA-C  amoxicillin-clavulanate (AUGMENTIN) 875-125 MG tablet Take 1 tablet by mouth 2 (two) times daily. 06/01/17  Yes Waldon Merl, PA-C  Ascorbic Acid (VITAMIN C) 1000 MG tablet Take 1,000 mg by mouth daily.   Yes [provider]  aspirin 81 MG tablet Take 81 mg by mouth daily.   Yes [provider]  azelastine (ASTELIN) 0.1 % nasal spray USE 2 SPRAYS IN EACH NOSTRILS TWICE A DAY AS DIRECTED 11/27/16  Yes Waldon Merl, PA-C  Cetirizine HCl 10 MG CAPS Take 10 mg by mouth daily.   Yes [provider]  Cholecalciferol (VITAMIN D-3) 1000 UNITS CAPS Take 1,000 Units by mouth daily.   Yes [provider]  gabapentin (NEURONTIN) 800 MG tablet Take 800 mg by mouth 5 (five) times daily.    Yes [provider]  ibuprofen (ADVIL,MOTRIN) 200 MG tablet Take 200 mg by mouth every 6 (six) hours as needed.   Yes [provider]  primidone (MYSOLINE) 50 MG tablet Take 150 mg by mouth 2 (two) times daily.   Yes [provider]  propranolol (INDERAL) 40 MG tablet Take 40 mg by mouth daily. 03/27/17  Yes [provider]  tamsulosin (FLOMAX) 0.4 MG CAPS capsule TAKE 1 CAPSULE (0.4 MG TOTAL) BY MOUTH AT BEDTIME. 07/05/17  Yes Waldon Merl, PA-C  vitamin B-12 (CYANOCOBALAMIN) 1000 MCG tablet Take 1,000 mcg by mouth daily.   Yes [provider]  atorvastatin (LIPITOR) 10 MG tablet Take 1 tablet (10 mg total) by mouth daily. 06/01/17   Waldon Merl, PA-C    Physical Exam:  Constitutional: NAD, calm, comfortable Vitals:   07/07/17 2300 07/07/17 2330 07/08/17 0030 07/08/17 0100  BP: 118/63 125/66 135/62 138/71  Pulse: (!) 55 (!) 59 (!) 55 (!) 55  Resp: 17 14 20  (!) 23  Temp:      TempSrc:      SpO2: 91% 95% 93% 91%  Weight:       Height:       Eyes: PERRL, lids and conjunctivae normal ENMT: Mucous membranes are moist. Posterior pharynx clear of any exudate or lesions.Normal dentition.  Neck: normal, supple, no masses, no thyromegaly Respiratory: Diffuse expiratory wheezes appreciated with rales in the right lower lung field. Cardiovascular: Regular rate and rhythm, no murmurs / rubs / gallops. No extremity edema. 2+ pedal pulses. No carotid bruits.  Abdomen: no tenderness, no masses palpated. No hepatosplenomegaly. Bowel sounds positive.  Musculoskeletal: no clubbing / cyanosis. No joint deformity upper and lower extremities. Good ROM, no contractures. Normal muscle tone.  Skin: no rashes, lesions, ulcers. No induration Neurologic: CN 2-12 grossly intact. Sensation intact, DTR  normal. Strength 5/5 in all 4.  Psychiatric: Normal judgment and insight. Alert and oriented x 3. Normal mood.     Labs on Admission: I have personally reviewed following labs and imaging studies  CBC:  Recent Labs Lab 07/07/17 2104  WBC 16.5*  NEUTROABS 8.2*  HGB 14.9  HCT 43.7  MCV 98.0  PLT 237   Basic Metabolic Panel:  Recent Labs Lab 07/07/17 2104  NA 136  K 4.4  CL 99*  CO2 28  GLUCOSE 113*  BUN 15  CREATININE 1.07  CALCIUM 9.4   GFR: Estimated Creatinine Clearance: 108.2 mL/min (by C-G formula based on SCr of 1.07 mg/dL). Liver Function Tests:  Recent Labs Lab 07/07/17 2104  AST 28  ALT 30  ALKPHOS 48  BILITOT 0.5  PROT 7.6  ALBUMIN 3.8   No results for input(s): LIPASE, AMYLASE in the last 168 hours. No results for input(s): AMMONIA in the last 168 hours. Coagulation Profile:  Recent Labs Lab 07/07/17 2104  INR 1.18   Cardiac Enzymes:  Recent Labs Lab 07/07/17 2104  TROPONINI 0.06*   BNP (last 3 results) No results for input(s): PROBNP in the last 8760 hours. HbA1C: No results for input(s): HGBA1C in the last 72 hours. CBG: No results for input(s): GLUCAP in the last 168  hours. Lipid Profile: No results for input(s): CHOL, HDL, LDLCALC, TRIG, CHOLHDL, LDLDIRECT in the last 72 hours. Thyroid Function Tests: No results for input(s): TSH, T4TOTAL, FREET4, T3FREE, THYROIDAB in the last 72 hours. Anemia Panel: No results for input(s): VITAMINB12, FOLATE, FERRITIN, TIBC, IRON, RETICCTPCT in the last 72 hours. Urine analysis:    Component Value Date/Time   COLORURINE YELLOW 07/07/2017 2106   APPEARANCEUR CLEAR 07/07/2017 2106   LABSPEC 1.021 07/07/2017 2106   PHURINE 7.0 07/07/2017 2106   GLUCOSEU NEGATIVE 07/07/2017 2106   GLUCOSEU NEGATIVE 05/31/2017 0915   HGBUR NEGATIVE 07/07/2017 2106   BILIRUBINUR NEGATIVE 07/07/2017 2106   KETONESUR NEGATIVE 07/07/2017 2106   PROTEINUR NEGATIVE 07/07/2017 2106   UROBILINOGEN 0.2 05/31/2017 0915   NITRITE NEGATIVE 07/07/2017 2106   LEUKOCYTESUR NEGATIVE 07/07/2017 2106   Sepsis Labs: No results found for this or any previous visit (from the past 240 hour(s)).   Radiological Exams on Admission: Dg Chest 2 View  Result Date: 07/07/2017 CLINICAL DATA:  Shortness of breath and wheezing EXAM: CHEST  2 VIEW COMPARISON:  August 15, 2016 FINDINGS: Lungs are somewhat hyperexpanded. There is no edema or consolidation. Heart size and pulmonary vascularity are normal. No adenopathy. There is degenerative change in the thoracic spine. IMPRESSION: Lungs mildly hyperexpanded without edema or consolidation. Stable cardiac silhouette. Electronically Signed   By: Bretta Bang III M.D.   On: 07/07/2017 21:31    EKG: Independently reviewed. Normal sinus rhythm  Assessment/Plan  COPD exacerbation with reported community-acquired pneumonia:Acute. Patient recently diagnosed with community-acquired pneumonia for which you have been on Augmentin and azithromycin. Patient reports having 2 more days to complete course. Chest x-ray currently clear patient with diffuse wheezing on physical exam. Given patient's history of tobacco  abuse suspect undiagnosed COPD. - Admit to a telemetry bed - Check urine studies and sputum culture - DuoNeb's 4 times a day and prn SOB/Wheezing - Continue empiric antibiotics of ceftriaxone and azithromycin de-escalate likely appropriate.  Leukocytosis: WBC elevated at 16.5 on admission. Likely secondary to above  - Recheck CBC in a.m.  Elevated troponin: Suspect secondary to demand ischemia - Trend cardiac enzymes  - may want further investigative studies  if troponins began to rise  Hyperlipidemia - Continue atorvastatin   HCOM - Continue propranolol and aspirin  Obesity BMI 37.8   OSA: Patient not currently on CPAP due to issues with insurance he has an appointment with Dr. Mayford Knife in the near future to get reestablished on CPAP.   Essential hypertension  - Continue medications as above   Benign prostatic hypertrophy - Continue Flomax   DVT prophylaxis: Lovenox  Code Status: Full Family Communication: discuss plan of care with the patient and family Disposition Plan: likely discharge home in 1-2 days Consults called: none  Admission status: Observation  Clydie Braun MD [facility] Pager [phone]   If 7PM-7AM, please contact night-coverage [email or web address] Password [email or web address]  07/08/2017, 1:37 AM

## 2017-07-08 NOTE — Progress Notes (Addendum)
Patient ID: Warren Baker, male   DOB: 1951-03-11, 66 y.o.   MRN: [id number]                                                                PROGRESS NOTE                                                                                                                                                                                                             Patient Demographics:    Warren Baker, is a 67 y.o. male, DOB - [birth year], WUJ:[id number]  Admit date - 07/07/2017   Admitting Physician Clydie Braun, MD  Outpatient Primary MD for the patient is Alija, Riano, PA-C  LOS - 0  Outpatient Specialists:    Chief Complaint  Patient presents with  . Shortness of Breath  . Wheezing       Brief Narrative   66 y.o. male with medical history significant of HOCM, OSA, GERD, remote history of tobacco abuse, obesity; who presents with complaints of cough and shortness of breath 1 week. Patient went to a urgent care facility and reports being diagnosed with a pneumonia after chest x-ray. He was prescribed albuterol inhaler, Augmentin, and azithromycin. Patient reports taking medications as prescribed and has 2 days of Augmentin to take. He reports despite taking medications he still had a productive cough with yellow to brownish sputum and wheezing. Symptoms progressively worsened yesterday to the point he felt like he can breathe at all. Denies having any chest pain, leg swelling, fever, recent sick contacts, or steroid use. He quit smoking in 2000, but previously reports smoking approximately 30 years at least 2 packs of cigarettes per day on average.  ED Course: On admission into the emergency department patient was seen to be afebrile, pulse 55-68, Respiration 14-23, and all other vital signs maintained. Labs revealed WBC 16.5, troponin 0.06, lactic acid 1.41. Chest x-ray showed mild hyperinflation of lungs but no acute infiltrate.   Subjective:    [name] today states that  his breathing is feeling better. Slight cough.  Afebrile.   No headache, No chest pain, No abdominal pain - No Nausea, No new weakness tingling or numbness,     Assessment  & Plan :    Principal Problem:   COPD exacerbation (HCC)  Active Problems:   Obesity (BMI 30-39.9)   OSA (obstructive sleep apnea)   HOCM (hypertrophic obstructive cardiomyopathy) (HCC)   Leukocytosis   CAP (community acquired pneumonia)   Elevated troponin  COPD exacerbation with reported community-acquired pneumonia:Acute. Patient recently diagnosed with community-acquired pneumonia for which you have been on Augmentin and azithromycin. Patient reports having 2 more days to complete course. Chest x-ray currently clear patient with diffuse wheezing on physical exam. Given patient's history of tobacco abuse suspect undiagnosed COPD. - Check sputum culture - DuoNeb's 4 times a day and prn SOB/Wheezing - Continue rocephin and zithromax Leukocytosis: WBC elevated at 16.5 on admission. Likely secondary to above  - Recheck CBC in a.m.  Elevated troponin: Suspect secondary to demand ischemia -  Trop I q6hx3 -cardiac echo -cont aspirin, lipitor, propranolol (might consider switch to different b-blocker, due to Copd) -cardiology consulted by email for evaluation  Hyperlipidemia - Continue atorvastatin   HCOM - Continue propranolol  Obesity BMI 37.8   OSA: Patient not currently on CPAP due to issues with insurance he has an appointment with Dr. Mayford Knife in the near future to get reestablished on CPAP.   Essential hypertension  - Continue medications as above   Benign prostatic hypertrophy - Continue Flomax  Hyponatremia Check cmp in am  Leukocytosis Check cbc in am  Hyperglycemia  Hga1c=5.5 (05/31/2017)    DVT prophylaxis: Lovenox  Code Status: Full Family Communication: w patient Disposition Plan: likely discharge home in 2 days Consults called: none  Admission status:  Observation   Lab Results  Component Value Date   PLT 129 (L) 07/08/2017    Antibiotics  :  Rocephin/zithromax 8/19=>  Anti-infectives    Start     Dose/Rate Route Frequency Ordered Stop   07/08/17 2200  cefTRIAXone (ROCEPHIN) 1 g in dextrose 5 % 50 mL IVPB     1 g 100 mL/hr over 30 Minutes Intravenous Every 24 hours 07/08/17 0216 07/15/17 2159   07/08/17 2200  azithromycin (ZITHROMAX) 500 mg in dextrose 5 % 250 mL IVPB     500 mg 250 mL/hr over 60 Minutes Intravenous Every 24 hours 07/08/17 0216 07/15/17 2159   07/07/17 2330  cefTRIAXone (ROCEPHIN) 1 g in dextrose 5 % 50 mL IVPB     1 g 100 mL/hr over 30 Minutes Intravenous  Once 07/07/17 2325 07/08/17 0110   07/07/17 2330  azithromycin (ZITHROMAX) 500 mg in dextrose 5 % 250 mL IVPB     500 mg 250 mL/hr over 60 Minutes Intravenous  Once 07/07/17 2325 07/08/17 0220        Objective:   Vitals:   07/08/17 1230 07/08/17 1245 07/08/17 1300 07/08/17 1400  BP: 110/60 110/60 121/61 126/60  Pulse: (!) 59 65 (!) 58 (!) 59  Resp: 19 20 (!) 23 (!) 22  Temp:      TempSrc:      SpO2: 90% 91% 91% 90%  Weight:      Height:        Wt Readings from Last 3 Encounters:  07/07/17 (!) 144.2 kg (318 lb)  05/31/17 (!) 142 kg (313 lb)  04/09/17 (!) 146.1 kg (322 lb)    No intake or output data in the 24 hours ending 07/08/17 1418   Physical Exam  Awake Alert, Oriented X 3, No new F.N deficits, Normal affect [place].AT,PERRAL Supple Neck,No JVD, No cervical lymphadenopathy appriciated.  Symmetrical Chest wall movement, Good air movement bilaterally, + bilateral wheezing, no crackles RRR,No Gallops,Rubs or new Murmurs,  No Parasternal Heave Morbidly obese +ve B.Sounds, Abd Soft, No tenderness, No organomegaly appriciated, No rebound - guarding or rigidity. No Cyanosis, Clubbing or edema, No new Rash or bruise      Data Review:    CBC  Recent Labs Lab 07/07/17 2104 07/08/17 0254  WBC 16.5* 11.4*  HGB 14.9 15.2  HCT 43.7 43.7   PLT 237 129*  MCV 98.0 98.4  MCH 33.4 34.2*  MCHC 34.1 34.8  RDW 12.1 12.4  LYMPHSABS 3.4  --   MONOABS 1.4*  --   EOSABS 3.5*  --   BASOSABS 0.1  --     Chemistries   Recent Labs Lab 07/07/17 2104 07/08/17 0254  NA 136 132*  K 4.4 4.7  CL 99* 99*  CO2 28 23  GLUCOSE 113* 162*  BUN 15 15  CREATININE 1.07 1.06  CALCIUM 9.4 9.1  AST 28  --   ALT 30  --   ALKPHOS 48  --   BILITOT 0.5  --    ------------------------------------------------------------------------------------------------------------------ No results for input(s): CHOL, HDL, LDLCALC, TRIG, CHOLHDL, LDLDIRECT in the last 72 hours.  Lab Results  Component Value Date   HGBA1C 5.5 05/31/2017   ------------------------------------------------------------------------------------------------------------------ No results for input(s): TSH, T4TOTAL, T3FREE, THYROIDAB in the last 72 hours.  Invalid input(s): FREET3 ------------------------------------------------------------------------------------------------------------------ No results for input(s): VITAMINB12, FOLATE, FERRITIN, TIBC, IRON, RETICCTPCT in the last 72 hours.  Coagulation profile  Recent Labs Lab 07/07/17 2104  INR 1.18    No results for input(s): DDIMER in the last 72 hours.  Cardiac Enzymes  Recent Labs Lab 07/07/17 2104 07/08/17 0254 07/08/17 0846  TROPONINI 0.06* 0.05* <0.03   ------------------------------------------------------------------------------------------------------------------    Component Value Date/Time   BNP 71.3 07/08/2017 0051    Inpatient Medications  Scheduled Meds: . aspirin EC  81 mg Oral Daily  . atorvastatin  10 mg Oral Daily  . enoxaparin (LOVENOX) injection  40 mg Subcutaneous Daily  . gabapentin  800 mg Oral 5 X Daily  . ipratropium-albuterol  3 mL Nebulization TID  . loratadine  10 mg Oral Daily  . methylPREDNISolone (SOLU-MEDROL) injection  60 mg Intravenous Q8H  . propranolol  40 mg  Oral Daily  . tamsulosin  0.4 mg Oral QHS   Continuous Infusions: . azithromycin    . cefTRIAXone (ROCEPHIN)  IV     PRN Meds:.acetaminophen **OR** acetaminophen, ipratropium-albuterol, ondansetron **OR** ondansetron (ZOFRAN) IV  Micro Results No results found for this or any previous visit (from the past 240 hour(s)).  Radiology Reports Dg Chest 2 View  Result Date: 07/07/2017 CLINICAL DATA:  Shortness of breath and wheezing EXAM: CHEST  2 VIEW COMPARISON:  August 15, 2016 FINDINGS: Lungs are somewhat hyperexpanded. There is no edema or consolidation. Heart size and pulmonary vascularity are normal. No adenopathy. There is degenerative change in the thoracic spine. IMPRESSION: Lungs mildly hyperexpanded without edema or consolidation. Stable cardiac silhouette. Electronically Signed   By: Bretta Bang III M.D.   On: 07/07/2017 21:31    Time Spent in minutes  30   Pearson Grippe M.D on 07/08/2017 at 2:18 PM  Between 7am to 7pm - Pager - [phone]  After 7pm go to [email or web address] - password [facility]  [facility] -  Office  [phone]

## 2017-07-09 ENCOUNTER — Other Ambulatory Visit (HOSPITAL_COMMUNITY): Payer: Medicare HMO

## 2017-07-09 DIAGNOSIS — G4733 Obstructive sleep apnea (adult) (pediatric): Secondary | ICD-10-CM

## 2017-07-09 DIAGNOSIS — D72829 Elevated white blood cell count, unspecified: Secondary | ICD-10-CM

## 2017-07-09 DIAGNOSIS — J189 Pneumonia, unspecified organism: Secondary | ICD-10-CM

## 2017-07-09 DIAGNOSIS — J441 Chronic obstructive pulmonary disease with (acute) exacerbation: Secondary | ICD-10-CM

## 2017-07-09 DIAGNOSIS — I421 Obstructive hypertrophic cardiomyopathy: Secondary | ICD-10-CM

## 2017-07-09 LAB — COMPREHENSIVE METABOLIC PANEL
ALT: 25 U/L (ref 17–63)
AST: 22 U/L (ref 15–41)
Albumin: 3.3 g/dL — ABNORMAL LOW (ref 3.5–5.0)
Alkaline Phosphatase: 45 U/L (ref 38–126)
Anion gap: 6 (ref 5–15)
BUN: 20 mg/dL (ref 6–20)
CO2: 27 mmol/L (ref 22–32)
Calcium: 9.4 mg/dL (ref 8.9–10.3)
Chloride: 102 mmol/L (ref 101–111)
Creatinine, Ser: 1.07 mg/dL (ref 0.61–1.24)
GFR calc Af Amer: >60 mL/min (ref >60)
GFR calc non Af Amer: >60 mL/min (ref >60)
Glucose, Bld: 196 mg/dL — ABNORMAL HIGH (ref 65–99)
Potassium: 4.9 mmol/L (ref 3.5–5.1)
Sodium: 135 mmol/L (ref 135–145)
Total Bilirubin: 0.5 mg/dL (ref 0.3–1.2)
Total Protein: 7.1 g/dL (ref 6.5–8.1)

## 2017-07-09 LAB — CBC
HCT: 43.6 % (ref 39.0–52.0)
Hemoglobin: 14.8 g/dL (ref 13.0–17.0)
MCH: 32.7 pg (ref 26.0–34.0)
MCHC: 33.9 g/dL (ref 30.0–36.0)
MCV: 96.2 fL (ref 78.0–100.0)
Platelets: 250 10*3/uL (ref 150–400)
RBC: 4.53 MIL/uL (ref 4.22–5.81)
RDW: 12 % (ref 11.5–15.5)
WBC: 17.4 10*3/uL — ABNORMAL HIGH (ref 4.0–10.5)

## 2017-07-09 LAB — LEGIONELLA PNEUMOPHILA SEROGP 1 UR AG: L. PNEUMOPHILA SEROGP 1 UR AG: NEGATIVE

## 2017-07-09 MED ORDER — DOXYCYCLINE HYCLATE 100 MG PO CAPS: 100.0000 mg | ORAL_CAPSULE | Freq: Two times a day (BID) | ORAL | 0 refills | Status: AC

## 2017-07-09 MED ORDER — AZITHROMYCIN 500 MG PO TABS: 500.0000 mg | ORAL_TABLET | Freq: Every day | ORAL | Status: DC

## 2017-07-09 MED ORDER — PREDNISONE 20 MG PO TABS: 40.0000 mg | ORAL_TABLET | Freq: Every day | ORAL | 0 refills | Status: AC

## 2017-07-09 MED ORDER — IPRATROPIUM-ALBUTEROL 0.5-2.5 (3) MG/3ML IN SOLN
3.0000 mL | RESPIRATORY_TRACT | 1 refills | Status: DC | PRN
Start: 2017-07-09 — End: 2017-08-03

## 2017-07-09 NOTE — Progress Notes (Signed)
Discussed discharge instructions, medications, and follow up appointments with patient and patients daughter. Both verbalized understanding with all questions answered. VSS. Pt discharged home with daughter.  Demyan Fugate,RN  

## 2017-07-09 NOTE — Discharge Summary (Addendum)
Physician Discharge Summary  [name] ZOX:[id number] DOB: 02-Jul-1951 DOA: 07/07/2017  PCP: Waldon Merl, PA-C Cardiology: Armanda Magic MD  Admit date: 07/07/2017 Discharge date: 07/09/2017  Admitted From: HOME  Disposition: HOME   Recommendations for Outpatient Follow-up:  1. Follow up with PCP in 1 weeks 2. Follow up with cardiologist in 1-2 weeks 3. Please obtain BMP/CBC in one week  Home Health: nebulizer equipment  Discharge Condition: stable CODE STATUS: full    Brief Hospitalization Summary: Please see all hospital notes, images, labs for full details of the hospitalization. HPI: Warren Baker is a 66 y.o. male with medical history significant of HOCM, OSA, GERD, remote history of tobacco abuse, obesity; who presents with complaints of cough and shortness of breath 1 week. Patient went to a urgent care facility and reports being diagnosed with a pneumonia after chest x-ray. He was prescribed albuterol inhaler, Augmentin, and azithromycin. Patient reports taking medications as prescribed and has 2 days of Augmentin to take. He reports despite taking medications he still had a productive cough with yellow to brownish sputum and wheezing. Symptoms progressively worsened yesterday to the point he felt like he can breathe at all. Denies having any chest pain, leg swelling, fever, recent sick contacts, or steroid use. He quit smoking in 2000, but previously reports smoking approximately 30 years at least 2 packs of cigarettes per day on average.  ED Course: On admission into the emergency department patient was seen to be afebrile, pulse 55-68, Respiration 14-23, and all other vital signs maintained. Labs revealed WBC 16.5, troponin 0.06, lactic acid 1.41. Chest x-ray showed mild hyperinflation of lungs but no acute infiltrate.  COPD exacerbation with reported community-acquired pneumonia:Acute. Patient recently diagnosed with community-acquired pneumonia for which had been  on Augmentin and azithromycin. Patient reports having 2 more days to complete course.  Pt was treated aggressively in hospital with nebs, steroids and antibiotics with good improvement in symptoms.  Pt asking to go home. He has a PCP to follow up with.  Will discharge on oral steroids, home nebs, doxycycline and follow up with PCP in 1 week.    Elevated troponin: Suspect secondary to demand ischemia -cont aspirin, lipitor, propranolol (might consider switch to different b-blocker, due to Copd) -follow up with outpatient cardiologist.    Hyperlipidemia - Continue atorvastatin   HCOM - Continue propranolol  ObesityBMI 37.8   OSA: Patient not currently on CPAP due to issues with insurance he has an appointment with Dr. Mayford Knife in the near future to get reestablished on CPAP.   Essential hypertension  - Continue medications as above   Benign prostatic hypertrophy - Continue Flomax  Hyponatremia Resolved with IVFs.   Leukocytosis Secondary to steroids and infection.  Recheck cBC outpatient with PCP.   Hyperglycemia  Hga1c=5.5 (05/31/2017)  DVT prophylaxis:Lovenox Code Status:Full Family Communication:w patient Disposition Plan:discharge home  Discharge Diagnoses:  Principal Problem:   COPD exacerbation (HCC) Active Problems:   Obesity (BMI 30-39.9)   OSA (obstructive sleep apnea)   HOCM (hypertrophic obstructive cardiomyopathy) (HCC)   Leukocytosis   CAP (community acquired pneumonia)   Elevated troponin  Discharge Instructions: Discharge Instructions    Call MD for:  difficulty breathing, headache or visual disturbances    Complete by:  As directed    Call MD for:  extreme fatigue    Complete by:  As directed    Call MD for:  hives    Complete by:  As directed    Call MD for:  persistant  dizziness or light-headedness    Complete by:  As directed    Call MD for:  persistant nausea and vomiting    Complete by:  As directed    Call MD for:  severe  uncontrolled pain    Complete by:  As directed    Call MD for:  temperature >100.4    Complete by:  As directed    Diet - low sodium heart healthy    Complete by:  As directed    Increase activity slowly    Complete by:  As directed      Allergies as of 07/09/2017      Reactions   Codeine Nausea And Vomiting   All Codeine Related Drugs      Medication List    STOP taking these medications   amoxicillin-clavulanate 875-125 MG tablet Commonly known as:  AUGMENTIN   ibuprofen 200 MG tablet Commonly known as:  ADVIL,MOTRIN     TAKE these medications   albuterol 108 (90 Base) MCG/ACT inhaler Commonly known as:  PROVENTIL HFA;VENTOLIN HFA Inhale 2 puffs into the lungs every 6 (six) hours as needed for wheezing or shortness of breath.   aspirin 81 MG tablet Take 81 mg by mouth daily.   atorvastatin 10 MG tablet Commonly known as:  LIPITOR Take 1 tablet (10 mg total) by mouth daily.   azelastine 0.1 % nasal spray Commonly known as:  ASTELIN USE 2 SPRAYS IN EACH NOSTRILS TWICE A DAY AS DIRECTED   Cetirizine HCl 10 MG Caps Take 10 mg by mouth daily.   doxycycline 100 MG capsule Commonly known as:  VIBRAMYCIN Take 1 capsule (100 mg total) by mouth 2 (two) times daily.   gabapentin 800 MG tablet Commonly known as:  NEURONTIN Take 800 mg by mouth 5 (five) times daily.   ipratropium-albuterol 0.5-2.5 (3) MG/3ML Soln Commonly known as:  DUONEB Take 3 mLs by nebulization every 4 (four) hours as needed (wheezing and shortness of breath).   predniSONE 20 MG tablet Commonly known as:  DELTASONE Take 2 tablets (40 mg total) by mouth daily with breakfast.   primidone 50 MG tablet Commonly known as:  MYSOLINE Take 150 mg by mouth 2 (two) times daily.   propranolol 40 MG tablet Commonly known as:  INDERAL Take 40 mg by mouth daily.   tamsulosin 0.4 MG Caps capsule Commonly known as:  FLOMAX TAKE 1 CAPSULE (0.4 MG TOTAL) BY MOUTH AT BEDTIME.   vitamin B-12 1000 MCG  tablet Commonly known as:  CYANOCOBALAMIN Take 1,000 mcg by mouth daily.   vitamin C 1000 MG tablet Take 1,000 mg by mouth daily.   Vitamin D-3 1000 units Caps Take 1,000 Units by mouth daily.            Durable Medical Equipment        Start     Ordered   07/09/17 1104  For home use only DME Nebulizer/meds  Once    Question:  Patient needs a nebulizer to treat with the following condition  Answer:  COPD with acute exacerbation (HCC)   07/09/17 1104     Follow-up Information    Waldon Merl, PA-C. Schedule an appointment as soon as possible for a visit in 1 week(s).   Specialty:  Family Medicine Why:  Hospital Follow Up  Contact information: [address] [place] [address] [phone]        Quintella Reichert, MD. Schedule an appointment as soon as possible  for a visit in 2 week(s).   Specialty:  Cardiology Why:  Hospital Follow Up  Contact information: [address] [place] [address] [phone]          Allergies  Allergen Reactions  . Codeine Nausea And Vomiting    All Codeine Related Drugs    Current Discharge Medication List    START taking these medications   Details  doxycycline (VIBRAMYCIN) 100 MG capsule Take 1 capsule (100 mg total) by mouth 2 (two) times daily. Qty: 14 capsule, Refills: 0    ipratropium-albuterol (DUONEB) 0.5-2.5 (3) MG/3ML SOLN Take 3 mLs by nebulization every 4 (four) hours as needed (wheezing and shortness of breath). Qty: 360 mL, Refills: 1    predniSONE (DELTASONE) 20 MG tablet Take 2 tablets (40 mg total) by mouth daily with breakfast. Qty: 10 tablet, Refills: 0      CONTINUE these medications which have NOT CHANGED   Details  albuterol (PROVENTIL HFA;VENTOLIN HFA) 108 (90 Base) MCG/ACT inhaler Inhale 2 puffs into the lungs every 6 (six) hours as needed for wheezing or shortness of breath. Qty: 1 Inhaler, Refills: 0   Associated Diagnoses: Acute bacterial bronchitis    Ascorbic  Acid (VITAMIN C) 1000 MG tablet Take 1,000 mg by mouth daily.    aspirin 81 MG tablet Take 81 mg by mouth daily.    azelastine (ASTELIN) 0.1 % nasal spray USE 2 SPRAYS IN EACH NOSTRILS TWICE A DAY AS DIRECTED Qty: 30 mL, Refills: 11    Cetirizine HCl 10 MG CAPS Take 10 mg by mouth daily.    Cholecalciferol (VITAMIN D-3) 1000 UNITS CAPS Take 1,000 Units by mouth daily.    gabapentin (NEURONTIN) 800 MG tablet Take 800 mg by mouth 5 (five) times daily.     primidone (MYSOLINE) 50 MG tablet Take 150 mg by mouth 2 (two) times daily.    propranolol (INDERAL) 40 MG tablet Take 40 mg by mouth daily. Refills: 5    tamsulosin (FLOMAX) 0.4 MG CAPS capsule TAKE 1 CAPSULE (0.4 MG TOTAL) BY MOUTH AT BEDTIME. Qty: 30 capsule, Refills: 3    vitamin B-12 (CYANOCOBALAMIN) 1000 MCG tablet Take 1,000 mcg by mouth daily.    atorvastatin (LIPITOR) 10 MG tablet Take 1 tablet (10 mg total) by mouth daily. Qty: 30 tablet, Refills: 1   Associated Diagnoses: Hyperlipidemia LDL goal <100      STOP taking these medications     amoxicillin-clavulanate (AUGMENTIN) 875-125 MG tablet      ibuprofen (ADVIL,MOTRIN) 200 MG tablet        Procedures/Studies: Dg Chest 2 View  Result Date: 07/07/2017 CLINICAL DATA:  Shortness of breath and wheezing EXAM: CHEST  2 VIEW COMPARISON:  August 15, 2016 FINDINGS: Lungs are somewhat hyperexpanded. There is no edema or consolidation. Heart size and pulmonary vascularity are normal. No adenopathy. There is degenerative change in the thoracic spine. IMPRESSION: Lungs mildly hyperexpanded without edema or consolidation. Stable cardiac silhouette. Electronically Signed   By: Bretta Bang III M.D.   On: 07/07/2017 21:31     Subjective: Pt says he feels better and breathing better and really wants to go home.   Discharge Exam: Vitals:   07/09/17 0746 07/09/17 1037  BP:  (!) 136/52  Pulse:  69  Resp:    Temp:    SpO2: 90%    Vitals:   07/09/17 0153 07/09/17  0438 07/09/17 0746 07/09/17 1037  BP: (!) 121/53 97/75  (!) 136/52  Pulse: Marland Kitchen)  58 72  69  Resp: 20 20    Temp:  97.7 F (36.5 C)    TempSrc:  Oral    SpO2: 90% 90% 90%   Weight:  (!) 138.5 kg (305 lb 4.8 oz)    Height:       General: Pt is alert, awake, not in acute distress Cardiovascular:  S1/S2 +, no rubs, no gallops Respiratory: good air movement, CTA bilaterally, no wheezing, no rhonchi Abdominal: Soft, NT, ND, bowel sounds + Extremities: no edema, no cyanosis Neurological: nonfocal.   The results of significant diagnostics from this hospitalization (including imaging, microbiology, ancillary and laboratory) are listed below for reference.     Microbiology: Recent Results (from the past 240 hour(s))  Culture, blood (Routine x 2)     Status: None (Preliminary result)   Collection Time: 07/07/17  9:08 PM  Result Value Ref Range Status   Specimen Description BLOOD RIGHT ARM  Final   Special Requests   Final    BOTTLES DRAWN AEROBIC AND ANAEROBIC Blood Culture adequate volume   Culture NO GROWTH < 24 HOURS  Final   Report Status PENDING  Incomplete  Culture, blood (Routine x 2)     Status: None (Preliminary result)   Collection Time: 07/07/17  9:15 PM  Result Value Ref Range Status   Specimen Description BLOOD RIGHT HAND  Final   Special Requests   Final    IN PEDIATRIC BOTTLE Blood Culture results may not be optimal due to an excessive volume of blood received in culture bottles   Culture NO GROWTH < 24 HOURS  Final   Report Status PENDING  Incomplete     Labs: BNP (last 3 results)  Recent Labs  07/08/17 0051  BNP 71.3   Basic Metabolic Panel:  Recent Labs Lab 07/07/17 2104 07/08/17 0254 07/09/17 0244  NA 136 132* 135  K 4.4 4.7 4.9  CL 99* 99* 102  CO2 28 23 27   GLUCOSE 113* 162* 196*  BUN 15 15 20   CREATININE 1.07 1.06 1.07  CALCIUM 9.4 9.1 9.4   Liver Function Tests:  Recent Labs Lab 07/07/17 2104 07/09/17 0244  AST 28 22  ALT 30 25   ALKPHOS 48 45  BILITOT 0.5 0.5  PROT 7.6 7.1  ALBUMIN 3.8 3.3*   No results for input(s): LIPASE, AMYLASE in the last 168 hours. No results for input(s): AMMONIA in the last 168 hours. CBC:  Recent Labs Lab 07/07/17 2104 07/08/17 0254 07/09/17 0244  WBC 16.5* 11.4* 17.4*  NEUTROABS 8.2*  --   --   HGB 14.9 15.2 14.8  HCT 43.7 43.7 43.6  MCV 98.0 98.4 96.2  PLT 237 129* 250   Cardiac Enzymes:  Recent Labs Lab 07/07/17 2104 07/08/17 0254 07/08/17 0846 07/08/17 1422  TROPONINI 0.06* 0.05* <0.03 0.03*   BNP: Invalid input(s): POCBNP CBG: No results for input(s): GLUCAP in the last 168 hours. D-Dimer No results for input(s): DDIMER in the last 72 hours. Hgb A1c No results for input(s): HGBA1C in the last 72 hours. Lipid Profile No results for input(s): CHOL, HDL, LDLCALC, TRIG, CHOLHDL, LDLDIRECT in the last 72 hours. Thyroid function studies No results for input(s): TSH, T4TOTAL, T3FREE, THYROIDAB in the last 72 hours.  Invalid input(s): FREET3 Anemia work up No results for input(s): VITAMINB12, FOLATE, FERRITIN, TIBC, IRON, RETICCTPCT in the last 72 hours. Urinalysis    Component Value Date/Time   COLORURINE YELLOW 07/07/2017 2106   APPEARANCEUR CLEAR 07/07/2017 2106  LABSPEC 1.021 07/07/2017 2106   PHURINE 7.0 07/07/2017 2106   GLUCOSEU NEGATIVE 07/07/2017 2106   GLUCOSEU NEGATIVE 05/31/2017 0915   HGBUR NEGATIVE 07/07/2017 2106   BILIRUBINUR NEGATIVE 07/07/2017 2106   KETONESUR NEGATIVE 07/07/2017 2106   PROTEINUR NEGATIVE 07/07/2017 2106   UROBILINOGEN 0.2 05/31/2017 0915   NITRITE NEGATIVE 07/07/2017 2106   LEUKOCYTESUR NEGATIVE 07/07/2017 2106   Sepsis Labs Invalid input(s): PROCALCITONIN,  WBC,  LACTICIDVEN Microbiology Recent Results (from the past 240 hour(s))  Culture, blood (Routine x 2)     Status: None (Preliminary result)   Collection Time: 07/07/17  9:08 PM  Result Value Ref Range Status   Specimen Description BLOOD RIGHT ARM  Final    Special Requests   Final    BOTTLES DRAWN AEROBIC AND ANAEROBIC Blood Culture adequate volume   Culture NO GROWTH < 24 HOURS  Final   Report Status PENDING  Incomplete  Culture, blood (Routine x 2)     Status: None (Preliminary result)   Collection Time: 07/07/17  9:15 PM  Result Value Ref Range Status   Specimen Description BLOOD RIGHT HAND  Final   Special Requests   Final    IN PEDIATRIC BOTTLE Blood Culture results may not be optimal due to an excessive volume of blood received in culture bottles   Culture NO GROWTH < 24 HOURS  Final   Report Status PENDING  Incomplete   Time coordinating discharge: 34 mins  SIGNED:  Standley Dakins, MD  [facility] 07/09/2017, 11:17 AM Pager [phone]  If 7PM-7AM, please contact night-coverage [email or web address] Password [email or web address]

## 2017-07-09 NOTE — Progress Notes (Signed)
[facility] called for Nebulizer machine to be delivered to the room today prior to discharging home today; Alexis Goodell [phone]

## 2017-07-09 NOTE — Progress Notes (Signed)
Text paged Linton Flemings to notify patient had 9 beats of Vtach. Patient denies any chest pain. Vital signs documented in flow sheet. Awaiting response

## 2017-07-09 NOTE — Progress Notes (Signed)
OT Cancellation Note  Patient Details Name: Warren Baker MRN: [id number] DOB: [birth year]   Cancelled Treatment:    Reason Eval/Treat Not Completed: OT screened, no needs identified, will sign off. Pt is able to complete ADL at baseline at this time. No further acute OT needs identified. Pt and wife in agreement. Will sign off. Thank you for this referral!  Doristine Section, MS OTR/L  Pager: [phone]   Rema Lievanos A Leighana Neyman 07/09/2017, 1:14 PM

## 2017-07-09 NOTE — Discharge Instructions (Signed)
Follow with Primary MD  Waldon Merl, PA-C  and other consultant's as instructed your Hospitalist MD  Please get a complete blood count and chemistry panel checked by your Primary MD at your next visit, and again as instructed by your Primary MD.  Get Medicines reviewed and adjusted: Please take all your medications with you for your next visit with your Primary MD  Laboratory/radiological data: Please request your Primary MD to go over all hospital tests and procedure/radiological results at the follow up, please ask your Primary MD to get all Hospital records sent to his/her office.  In some cases, they will be blood work, cultures and biopsy results pending at the time of your discharge. Please request that your primary care M.D. follows up on these results.  Also Note the following: If you experience worsening of your admission symptoms, develop shortness of breath, life threatening emergency, suicidal or homicidal thoughts you must seek medical attention immediately by calling 911 or calling your MD immediately  if symptoms less severe.  You must read complete instructions/literature along with all the possible adverse reactions/side effects for all the Medicines you take and that have been prescribed to you. Take any new Medicines after you have completely understood and accpet all the possible adverse reactions/side effects.   Do not drive when taking Pain medications or sleeping medications (Benzodaizepines)  Do not take more than prescribed Pain, Sleep and Anxiety Medications. It is not advisable to combine anxiety,sleep and pain medications without talking with your primary care practitioner  Special Instructions: If you have smoked or chewed Tobacco  in the last 2 yrs please stop smoking, stop any regular Alcohol  and or any Recreational drug use.  Wear Seat belts while driving.  Please note: You were cared for by a hospitalist during your hospital stay. Once you are  discharged, your primary care physician will handle any further medical issues. Please note that NO REFILLS for any discharge medications will be authorized once you are discharged, as it is imperative that you return to your primary care physician (or establish a relationship with a primary care physician if you do not have one) for your post hospital discharge needs so that they can reassess your need for medications and monitor your lab values.

## 2017-07-10 ENCOUNTER — Telehealth: Payer: Self-pay

## 2017-07-10 NOTE — Telephone Encounter (Signed)
LM requesting call back to complete TCM and confirm hospital f/u appt.  

## 2017-07-11 ENCOUNTER — Other Ambulatory Visit: Payer: Self-pay

## 2017-07-11 NOTE — Patient Outreach (Signed)
[facility]) Care Management  07/11/2017  Warren Baker 02/06/1951 [id number]   Transition of care  Week # 1 Referral date: 07/10/17 Referral source: Transition of care / status post hospital discharge on 07/09/17 from [facility]:  Transition of care/ COPD Insurance: [employer] Providers: Marcelline Mates, PA Social support"  Daughter, [name]. Patient gave verbal authorization to speak with his daughter regarding all of his personal health information.    SUBJECTIVE: Telephone call to patient regarding transition of care follow up. Patient states he was discharged to home with assistance from his daughter. Patient denies having any home health services. Patient states he has his nebulizer and is using it as prescribed. Patient states he is feeling better. States he continues to take his prescribed antibiotics. Patient states he has a follow up appointment with his primary MD on 07/13/17 and a follow up with his cardiologist on 08/06/17.  Patient verbally consented to having transition of care follow up.   RNCM discussed with patient signs symptoms of COPD/ pneumonia.  Advised patient to call his primary MD for new onset of symptoms or worsening current symptoms. Advised patient to call 911 for severe symptoms.  RNCM advised patient to continue to take his medications as prescribed and keep follow up appointment with doctors.  Patient given contact name / phone number for [facility] care management and 24 hour nurse call line.  Patient verbally agreed to next outreach with RNCM.   ASSESSMENT:  Per patients discharge summary: Admit date: 07/07/2017 Discharge date: 07/09/2017 66 y.o.malewith medical history significant of HOCM, OSA, GERD, remote history of tobacco abuse, obesity; COPD exacerbation with reported community-acquired pneumonia:Acute. Patient recently diagnosed with community-acquired pneumonia for which had been on Augmentin and azithromycin. Patient reports  having 2 more days to complete course.  Pt was treated aggressively in hospital with nebs, steroids and antibiotics with good improvement in symptoms.  PLAN:  RNCM will follow up with patient within 1 week. RNCM will send patient [facility] care management welcome letter and consent RNCM will send involvement letter to primary MD  Patient will report outcome of primary MD follow up appointment.   George Ina RN,BSN,CCM [facility] Telephonic  [phone]

## 2017-07-11 NOTE — Telephone Encounter (Signed)
LM requesting call back to confirm hosp f/u appt and complete TCM.

## 2017-07-12 LAB — CULTURE, BLOOD (ROUTINE X 2)
Culture: NO GROWTH
Culture: NO GROWTH
Special Requests: ADEQUATE

## 2017-07-12 NOTE — Telephone Encounter (Signed)
3rd attempt at reaching pt for TCM, appt for 07/13/17.

## 2017-07-13 ENCOUNTER — Encounter: Payer: Self-pay | Admitting: Physician Assistant

## 2017-07-13 ENCOUNTER — Ambulatory Visit (INDEPENDENT_AMBULATORY_CARE_PROVIDER_SITE_OTHER): Payer: Medicare HMO | Admitting: Physician Assistant

## 2017-07-13 ENCOUNTER — Encounter: Payer: Self-pay | Admitting: Emergency Medicine

## 2017-07-13 VITALS — BP 122/76 | HR 50 | Temp 97.6°F | Resp 14 | Ht 75.0 in | Wt 312.0 lb

## 2017-07-13 DIAGNOSIS — J189 Pneumonia, unspecified organism: Secondary | ICD-10-CM

## 2017-07-13 DIAGNOSIS — J441 Chronic obstructive pulmonary disease with (acute) exacerbation: Secondary | ICD-10-CM

## 2017-07-13 LAB — CBC WITH DIFFERENTIAL/PLATELET
BASOS PCT: 0.3 % (ref 0.0–3.0)
Basophils Absolute: 0 10*3/uL (ref 0.0–0.1)
Eosinophils Absolute: 0.8 10*3/uL — ABNORMAL HIGH (ref 0.0–0.7)
Eosinophils Relative: 5.3 % — ABNORMAL HIGH (ref 0.0–5.0)
HEMATOCRIT: 45.5 % (ref 39.0–52.0)
HEMOGLOBIN: 15.4 g/dL (ref 13.0–17.0)
LYMPHS PCT: 14.5 % (ref 12.0–46.0)
Lymphs Abs: 2.3 10*3/uL (ref 0.7–4.0)
MCHC: 33.8 g/dL (ref 30.0–36.0)
MCV: 99.6 fl (ref 78.0–100.0)
MONOS PCT: 4.5 % (ref 3.0–12.0)
Monocytes Absolute: 0.7 10*3/uL (ref 0.1–1.0)
NEUTROS ABS: 11.7 10*3/uL — AB (ref 1.4–7.7)
Neutrophils Relative %: 75.4 % (ref 43.0–77.0)
PLATELETS: 242 10*3/uL (ref 150.0–400.0)
RBC: 4.57 Mil/uL (ref 4.22–5.81)
RDW: 12.8 % (ref 11.5–15.5)

## 2017-07-13 LAB — BASIC METABOLIC PANEL
BUN: 19 mg/dL (ref 6–23)
CHLORIDE: 101 meq/L (ref 96–112)
CO2: 29 mEq/L (ref 19–32)
CREATININE: 0.98 mg/dL (ref 0.40–1.50)
Calcium: 9.5 mg/dL (ref 8.4–10.5)
GFR: 81.43 mL/min (ref >60.00)
GLUCOSE: 114 mg/dL — AB (ref 70–99)
Potassium: 4.7 mEq/L (ref 3.5–5.1)
Sodium: 136 mEq/L (ref 135–145)

## 2017-07-13 MED ORDER — METOPROLOL TARTRATE 25 MG PO TABS: 25.0000 mg | ORAL_TABLET | Freq: Two times a day (BID) | ORAL | 1 refills | Status: DC

## 2017-07-13 NOTE — Progress Notes (Signed)
Pre visit review using our clinic review tool, if applicable. No additional management support is needed unless otherwise documented below in the visit note. 

## 2017-07-13 NOTE — Patient Instructions (Signed)
Please go to the lab for blood work. I will call with results.  Finish the entire course of antibiotic and steroids.  Continue neb treatments as directed when needed for chest tightness/SOB. Please stop the Propranolol and start the Metoprolol 25 mg twice daily.   Follow-up with Dr. Mayford Knife as scheduled. Follow-up with me in 10 days.  If anything worsens, return sooner or go to the ER.

## 2017-07-13 NOTE — Progress Notes (Signed)
Patient presents to clinic today for hospital follow-up. Patient presented to ER on 07/07/17 with c/o 1 week of shortness of breath and cough. Patient had presented previously to Urgent Care where he was diagnosed with CAP and started on augmentin and albuterol. Noted continued and slightly worsening symptoms so proceeded to ER. ER workup included labs revealing leukocytosis and mild elevation in troponin. CXR obtained and negative for acute infiltrate. Patient subsequently admitted to hospital for management and further assessment. During hospitalization, patient treated aggressively with nebs, steroids and IV antibiotics. Cardiology consulted and attributed elevated troponin to demand ischemia as there was no further increase in levels. Patient stabilized and discharged home on oral doxycycline and prednisone taper.   Since discharge patient endorses dong well overall. Is taking medications as directed. Denies fever, chills. Still has cough but improving daily. Denies new or worsening symptoms. Patient denies chest pain, palpitations, lightheadedness, dizziness, vision changes or frequent headaches.   Past Medical History:  Diagnosis Date  . Allergic rhinitis   . Arthritis   . Broken leg   . Carbon dioxide poisoning   . Colon polyps   . Environmental allergies   . Excessive daytime sleepiness 07/13/2016  . GERD (gastroesophageal reflux disease)   . History of chicken pox   . 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 Kidney stones   . Obesity (BMI 30-39.9) 07/13/2016  . OSA (obstructive sleep apnea) 11/01/2016   Mild with AHI 11/hr  . Seasonal allergies   . Snoring 07/13/2016  . Tremors of nervous system     Current Outpatient Prescriptions on File Prior to Visit  Medication Sig Dispense Refill  . albuterol (PROVENTIL HFA;VENTOLIN HFA) 108 (90 Base) MCG/ACT inhaler Inhale 2 puffs into the lungs every 6 (six) hours as  needed for wheezing or shortness of breath. 1 Inhaler 0  . Ascorbic Acid (VITAMIN C) 1000 MG tablet Take 1,000 mg by mouth daily.    Marland Kitchen aspirin 81 MG tablet Take 81 mg by mouth daily.    Marland Kitchen atorvastatin (LIPITOR) 10 MG tablet Take 1 tablet (10 mg total) by mouth daily. 30 tablet 1  . azelastine (ASTELIN) 0.1 % nasal spray USE 2 SPRAYS IN EACH NOSTRILS TWICE A DAY AS DIRECTED 30 mL 11  . Cetirizine HCl 10 MG CAPS Take 10 mg by mouth daily.    . Cholecalciferol (VITAMIN D-3) 1000 UNITS CAPS Take 1,000 Units by mouth daily.    Marland Kitchen doxycycline (VIBRAMYCIN) 100 MG capsule Take 1 capsule (100 mg total) by mouth 2 (two) times daily. 14 capsule 0  . gabapentin (NEURONTIN) 800 MG tablet Take 800 mg by mouth 5 (five) times daily.     Marland Kitchen ipratropium-albuterol (DUONEB) 0.5-2.5 (3) MG/3ML SOLN Take 3 mLs by nebulization every 4 (four) hours as needed (wheezing and shortness of breath). 360 mL 1  . predniSONE (DELTASONE) 20 MG tablet Take 2 tablets (40 mg total) by mouth daily with breakfast. 10 tablet 0  . primidone (MYSOLINE) 50 MG tablet Take 150 mg by mouth 2 (two) times daily.    . tamsulosin (FLOMAX) 0.4 MG CAPS capsule TAKE 1 CAPSULE (0.4 MG TOTAL) BY MOUTH AT BEDTIME. 30 capsule 3  . vitamin B-12 (CYANOCOBALAMIN) 1000 MCG tablet Take 1,000 mcg by mouth daily.     No current facility-administered medications on file prior to visit.     Allergies  Allergen Reactions  . Codeine Nausea And Vomiting  All Codeine Related Drugs     Family History  Problem Relation Age of Onset  . Heart disease Mother   . Congestive Heart Failure Mother 36       Deceased  . Alcoholism Father        Living  . Arthritis Father   . Diabetes Maternal Aunt   . Cancer Other        PGGM  . Breast cancer Maternal Aunt   . Lung cancer Maternal Uncle   . Heart disease Brother   . Heart attack Brother   . Congestive Heart Failure Brother 50       Deceased  . Stroke Maternal Aunt   . Emphysema Brother        #2  .  Arthritis Sister        #1  . Allergies Daughter   . Kidney Stones Daughter   . Gallbladder disease Daughter   . Migraines Daughter     Social History   Social History  . Marital status: Single    Spouse name: N/A  . Number of children: 3  . Years of education: N/A   Occupational History  . [employer] Technician    Social History Main Topics  . Smoking status: Former Smoker    Packs/day: 2.00    Years: 30.00    Types: Cigarettes    Quit date: 11/20/2000  . Smokeless tobacco: Never Used  . Alcohol use No  . Drug use: No  . Sexual activity: Not Asked   Other Topics Concern  . None   Social History Narrative  . None    Review of Systems - See HPI.  All other ROS are negative.  BP 122/76   Pulse (!) 50   Temp 97.6 F (36.4 C) (Oral)   Resp 14   Ht 6' 3" (1.905 m)   Wt (!) 312 lb (141.5 kg)   SpO2 93%   BMI 39.00 kg/m   Physical Exam  Constitutional: He is oriented to person, place, and time and well-developed, well-nourished, and in no distress.  HENT:  Head: Normocephalic and atraumatic.  Eyes: Conjunctivae are normal.  Neck: Neck supple.  Cardiovascular: Normal rate, regular rhythm, normal heart sounds and intact distal pulses.   Pulmonary/Chest: Effort normal and breath sounds normal. No respiratory distress. He has no wheezes. He has no rales. He exhibits no tenderness.  Neurological: He is alert and oriented to person, place, and time.  Skin: Skin is warm and dry.  Psychiatric: Affect normal.  Vitals reviewed.   Recent Results (from the past 2160 hour(s))  CBC     Status: None   Collection Time: 05/31/17  9:15 AM  Result Value Ref Range   WBC 5.7 4.0 - 10.5 K/uL   RBC 4.47 4.22 - 5.81 Mil/uL   Platelets 166.0 150.0 - 400.0 K/uL   Hemoglobin 15.2 13.0 - 17.0 g/dL   HCT 44.3 39.0 - 52.0 %   MCV 99.0 78.0 - 100.0 fl   MCHC 34.3 30.0 - 36.0 g/dL   RDW 13.1 11.5 - 15.5 %  Comprehensive metabolic panel     Status: None   Collection Time: 05/31/17  9:15 AM   Result Value Ref Range   Sodium 140 135 - 145 mEq/L   Potassium 4.9 3.5 - 5.1 mEq/L   Chloride 103 96 - 112 mEq/L   CO2 32 19 - 32 mEq/L   Glucose, Bld 91 70 - 99 mg/dL   BUN 15 6 -  23 mg/dL   Creatinine, Ser 1.00 0.40 - 1.50 mg/dL   Total Bilirubin 0.6 0.2 - 1.2 mg/dL   Alkaline Phosphatase 47 39 - 117 U/L   AST 25 0 - 37 U/L   ALT 20 0 - 53 U/L   Total Protein 7.2 6.0 - 8.3 g/dL   Albumin 4.3 3.5 - 5.2 g/dL   Calcium 9.8 8.4 - 10.5 mg/dL   GFR 79.59 >60.00 mL/min  PSA     Status: None   Collection Time: 05/31/17  9:15 AM  Result Value Ref Range   PSA 1.68 0.10 - 4.00 ng/mL  Lipid panel     Status: Abnormal   Collection Time: 05/31/17  9:15 AM  Result Value Ref Range   Cholesterol 197 0 - 200 mg/dL    Comment: ATP III Classification       Desirable:  < 200 mg/dL               Borderline High:  200 - 239 mg/dL          High:  > = 240 mg/dL   Triglycerides 97.0 0.0 - 149.0 mg/dL    Comment: Normal:  <150 mg/dLBorderline High:  150 - 199 mg/dL   HDL 38.10 (L) >39.00 mg/dL   VLDL 19.4 0.0 - 40.0 mg/dL   LDL Cholesterol 139 (H) 0 - 99 mg/dL   Total CHOL/HDL Ratio 5     Comment:                Men          Women1/2 Average Risk     3.4          3.3Average Risk          5.0          4.42X Average Risk          9.6          7.13X Average Risk          15.0          11.0                       NonHDL 158.41     Comment: NOTE:  Non-HDL goal should be 30 mg/dL higher than patient's LDL goal (i.e. LDL goal of < 70 mg/dL, would have non-HDL goal of < 100 mg/dL)  Hemoglobin A1c     Status: None   Collection Time: 05/31/17  9:15 AM  Result Value Ref Range   Hgb A1c MFr Bld 5.5 4.6 - 6.5 %    Comment: Glycemic Control Guidelines for People with Diabetes:Non Diabetic:  <6%Goal of Therapy: <7%Additional Action Suggested:  >8%   TSH     Status: None   Collection Time: 05/31/17  9:15 AM  Result Value Ref Range   TSH 1.68 0.35 - 4.50 uIU/mL  Urinalysis, Routine w reflex microscopic      Status: Abnormal   Collection Time: 05/31/17  9:15 AM  Result Value Ref Range   Color, Urine YELLOW Yellow;Lt. Yellow   APPearance CLEAR Clear   Specific Gravity, Urine >=1.030 (A) 1.000 - 1.030   pH 5.5 5.0 - 8.0   Total Protein, Urine NEGATIVE Negative   Urine Glucose NEGATIVE Negative   Ketones, ur NEGATIVE Negative   Bilirubin Urine NEGATIVE Negative   Hgb urine dipstick NEGATIVE Negative   Urobilinogen, UA 0.2 0.0 - 1.0   Leukocytes, UA NEGATIVE Negative   Nitrite  NEGATIVE Negative   WBC, UA 0-2/hpf 0-2/hpf   RBC / HPF none seen 0-2/hpf   Mucus, UA Presence of (A) None   Squamous Epithelial / LPF Rare(0-4/hpf) Rare(0-4/hpf)  Comprehensive metabolic panel     Status: Abnormal   Collection Time: 07/07/17  9:04 PM  Result Value Ref Range   Sodium 136 135 - 145 mmol/L   Potassium 4.4 3.5 - 5.1 mmol/L   Chloride 99 (L) 101 - 111 mmol/L   CO2 28 22 - 32 mmol/L   Glucose, Bld 113 (H) 65 - 99 mg/dL   BUN 15 6 - 20 mg/dL   Creatinine, Ser 1.07 0.61 - 1.24 mg/dL   Calcium 9.4 8.9 - 10.3 mg/dL   Total Protein 7.6 6.5 - 8.1 g/dL   Albumin 3.8 3.5 - 5.0 g/dL   AST 28 15 - 41 U/L   ALT 30 17 - 63 U/L   Alkaline Phosphatase 48 38 - 126 U/L   Total Bilirubin 0.5 0.3 - 1.2 mg/dL   GFR calc non Af Amer >60 >60 mL/min   GFR calc Af Amer >60 >60 mL/min    Comment: (NOTE) The eGFR has been calculated using the CKD EPI equation. This calculation has not been validated in all clinical situations. eGFR's persistently <60 mL/min signify possible Chronic Kidney Disease.    Anion gap 9 5 - 15  CBC with Differential     Status: Abnormal   Collection Time: 07/07/17  9:04 PM  Result Value Ref Range   WBC 16.5 (H) 4.0 - 10.5 K/uL   RBC 4.46 4.22 - 5.81 MIL/uL   Hemoglobin 14.9 13.0 - 17.0 g/dL   HCT 43.7 39.0 - 52.0 %   MCV 98.0 78.0 - 100.0 fL   MCH 33.4 26.0 - 34.0 pg   MCHC 34.1 30.0 - 36.0 g/dL   RDW 12.1 11.5 - 15.5 %   Platelets 237 150 - 400 K/uL   Neutrophils Relative % 50 %    Neutro Abs 8.2 (H) 1.7 - 7.7 K/uL   Lymphocytes Relative 20 %   Lymphs Abs 3.4 0.7 - 4.0 K/uL   Monocytes Relative 8 %   Monocytes Absolute 1.4 (H) 0.1 - 1.0 K/uL   Eosinophils Relative 21 %   Eosinophils Absolute 3.5 (H) 0.0 - 0.7 K/uL   Basophils Relative 1 %   Basophils Absolute 0.1 0.0 - 0.1 K/uL  Protime-INR     Status: None   Collection Time: 07/07/17  9:04 PM  Result Value Ref Range   Prothrombin Time 15.0 11.4 - 15.2 seconds   INR 1.18   Troponin I     Status: Abnormal   Collection Time: 07/07/17  9:04 PM  Result Value Ref Range   Troponin I 0.06 (HH) <0.03 ng/mL    Comment: CRITICAL RESULT CALLED TO, READ BACK BY AND VERIFIED WITH: C.CHRISCOE,RN 2343 07/07/17 M.CAMPBELL   Urinalysis, Routine w reflex microscopic     Status: None   Collection Time: 07/07/17  9:06 PM  Result Value Ref Range   Color, Urine YELLOW YELLOW   APPearance CLEAR CLEAR   Specific Gravity, Urine 1.021 1.005 - 1.030   pH 7.0 5.0 - 8.0   Glucose, UA NEGATIVE NEGATIVE mg/dL   Hgb urine dipstick NEGATIVE NEGATIVE   Bilirubin Urine NEGATIVE NEGATIVE   Ketones, ur NEGATIVE NEGATIVE mg/dL   Protein, ur NEGATIVE NEGATIVE mg/dL   Nitrite NEGATIVE NEGATIVE   Leukocytes, UA NEGATIVE NEGATIVE  Legionella Pneumophila Serogp 1 Ur Ag  Status: None   Collection Time: 07/07/17  9:06 PM  Result Value Ref Range   L. pneumophila Serogp 1 Ur Ag Negative Negative    Comment: (NOTE) Presumptive negative for L. pneumophila serogroup 1 antigen in urine, suggesting no recent or current infection. Legionnaires' disease cannot be ruled out since other serogroups and species may also cause disease. Performed At: [facility] [place], [place] [id number] Lindon Romp MD QM:[id number]   Strep pneumoniae urinary antigen     Status: None   Collection Time: 07/07/17  9:06 PM  Result Value Ref Range   Strep Pneumo Urinary Antigen NEGATIVE NEGATIVE    Comment:        Infection due to S.  pneumoniae cannot be absolutely ruled out since the antigen present may be below the detection limit of the test.   Culture, blood (Routine x 2)     Status: None   Collection Time: 07/07/17  9:08 PM  Result Value Ref Range   Specimen Description BLOOD RIGHT ARM    Special Requests      BOTTLES DRAWN AEROBIC AND ANAEROBIC Blood Culture adequate volume   Culture NO GROWTH 5 DAYS    Report Status 07/12/2017 FINAL   Culture, blood (Routine x 2)     Status: None   Collection Time: 07/07/17  9:15 PM  Result Value Ref Range   Specimen Description BLOOD RIGHT HAND    Special Requests      IN PEDIATRIC BOTTLE Blood Culture results may not be optimal due to an excessive volume of blood received in culture bottles   Culture NO GROWTH 5 DAYS    Report Status 07/12/2017 FINAL   I-Stat CG4 Lactic Acid, ED     Status: None   Collection Time: 07/07/17  9:39 PM  Result Value Ref Range   Lactic Acid, Venous 1.41 0.5 - 1.9 mmol/L  I-Stat CG4 Lactic Acid, ED     Status: None   Collection Time: 07/07/17 11:59 PM  Result Value Ref Range   Lactic Acid, Venous 0.60 0.5 - 1.9 mmol/L  Brain natriuretic peptide     Status: None   Collection Time: 07/08/17 12:51 AM  Result Value Ref Range   B Natriuretic Peptide 71.3 0.0 - 100.0 pg/mL  HIV antibody (Routine Testing)     Status: None   Collection Time: 07/08/17  2:54 AM  Result Value Ref Range   HIV Screen 4th Generation wRfx Non Reactive Non Reactive    Comment: (NOTE) Performed At: [facility] [place], [place] [id number] Lindon Romp MD NU:[id number]   CBC     Status: Abnormal   Collection Time: 07/08/17  2:54 AM  Result Value Ref Range   WBC 11.4 (H) 4.0 - 10.5 K/uL   RBC 4.44 4.22 - 5.81 MIL/uL   Hemoglobin 15.2 13.0 - 17.0 g/dL   HCT 43.7 39.0 - 52.0 %   MCV 98.4 78.0 - 100.0 fL   MCH 34.2 (H) 26.0 - 34.0 pg   MCHC 34.8 30.0 - 36.0 g/dL   RDW 12.4 11.5 - 15.5 %   Platelets 129 (L) 150 - 400 K/uL  Basic  metabolic panel     Status: Abnormal   Collection Time: 07/08/17  2:54 AM  Result Value Ref Range   Sodium 132 (L) 135 - 145 mmol/L   Potassium 4.7 3.5 - 5.1 mmol/L   Chloride 99 (L) 101 - 111 mmol/L   CO2 23 22 -  32 mmol/L   Glucose, Bld 162 (H) 65 - 99 mg/dL   BUN 15 6 - 20 mg/dL   Creatinine, Ser 1.06 0.61 - 1.24 mg/dL   Calcium 9.1 8.9 - 10.3 mg/dL   GFR calc non Af Amer >60 >60 mL/min   GFR calc Af Amer >60 >60 mL/min    Comment: (NOTE) The eGFR has been calculated using the CKD EPI equation. This calculation has not been validated in all clinical situations. eGFR's persistently <60 mL/min signify possible Chronic Kidney Disease.    Anion gap 10 5 - 15  Troponin I (q 6hr x 3)     Status: Abnormal   Collection Time: 07/08/17  2:54 AM  Result Value Ref Range   Troponin I 0.05 (HH) <0.03 ng/mL    Comment: CRITICAL VALUE NOTED.  VALUE IS CONSISTENT WITH PREVIOUSLY REPORTED AND CALLED VALUE.  Troponin I (q 6hr x 3)     Status: None   Collection Time: 07/08/17  8:46 AM  Result Value Ref Range   Troponin I <0.03 <0.03 ng/mL  Troponin I (q 6hr x 3)     Status: Abnormal   Collection Time: 07/08/17  2:22 PM  Result Value Ref Range   Troponin I 0.03 (HH) <0.03 ng/mL    Comment: CRITICAL RESULT CALLED TO, READ BACK BY AND VERIFIED WITH: T.GOSS RN @ 1516 07/08/17 BY C.EDENS   CBC     Status: Abnormal   Collection Time: 07/09/17  2:44 AM  Result Value Ref Range   WBC 17.4 (H) 4.0 - 10.5 K/uL   RBC 4.53 4.22 - 5.81 MIL/uL   Hemoglobin 14.8 13.0 - 17.0 g/dL   HCT 43.6 39.0 - 52.0 %   MCV 96.2 78.0 - 100.0 fL   MCH 32.7 26.0 - 34.0 pg   MCHC 33.9 30.0 - 36.0 g/dL   RDW 12.0 11.5 - 15.5 %   Platelets 250 150 - 400 K/uL  Comprehensive metabolic panel     Status: Abnormal   Collection Time: 07/09/17  2:44 AM  Result Value Ref Range   Sodium 135 135 - 145 mmol/L   Potassium 4.9 3.5 - 5.1 mmol/L   Chloride 102 101 - 111 mmol/L   CO2 27 22 - 32 mmol/L   Glucose, Bld 196 (H) 65 -  99 mg/dL   BUN 20 6 - 20 mg/dL   Creatinine, Ser 1.07 0.61 - 1.24 mg/dL   Calcium 9.4 8.9 - 10.3 mg/dL   Total Protein 7.1 6.5 - 8.1 g/dL   Albumin 3.3 (L) 3.5 - 5.0 g/dL   AST 22 15 - 41 U/L   ALT 25 17 - 63 U/L   Alkaline Phosphatase 45 38 - 126 U/L   Total Bilirubin 0.5 0.3 - 1.2 mg/dL   GFR calc non Af Amer >60 >60 mL/min   GFR calc Af Amer >60 >60 mL/min    Comment: (NOTE) The eGFR has been calculated using the CKD EPI equation. This calculation has not been validated in all clinical situations. eGFR's persistently <60 mL/min signify possible Chronic Kidney Disease.    Anion gap 6 5 - 15    Assessment/Plan: 1. Community acquired pneumonia, unspecified laterality Afebrile. Lungs CTAB. Will repeat labs today. Patient to complete entire course of antibiotics and steroids. Close follow-up discussed if symptoms are not continuing to resolve.  - CBC w/Diff - Basic metabolic panel  2. COPD exacerbation ([place]) Acute exacerbation resolved. Patient to complete entire course of steroids. Patient currently on  Propranolol for tremor by neurology. Will switch to cardioselective betablocker to help reduce COPD symptoms and likelihood of exacerbation. Will switch to metoprolol at low dose. Follow-up scheduled.    Leeanne Rio, PA-C

## 2017-07-19 ENCOUNTER — Ambulatory Visit: Payer: Self-pay

## 2017-07-22 ENCOUNTER — Other Ambulatory Visit: Payer: Self-pay

## 2017-07-22 NOTE — Patient Outreach (Signed)
[facility] Care Management  07/22/2017  Warren RidgeWilliam Baker January 26, 1951 [id number]   Telephone call to patient regarding transition of care follow up.  Unable to reach patient. HIPAA compliant voice message left with call back phone number.   PLAN;  RNCM will attempt 2nd telephone call to patient within 3 business days.   Warren [name] [name] RN,BSN,CCM [facility] L[facility] Telephonic  [phone]

## 2017-07-24 ENCOUNTER — Encounter: Payer: Self-pay | Admitting: Emergency Medicine

## 2017-07-24 ENCOUNTER — Ambulatory Visit (INDEPENDENT_AMBULATORY_CARE_PROVIDER_SITE_OTHER): Payer: Medicare HMO

## 2017-07-24 ENCOUNTER — Other Ambulatory Visit: Payer: Self-pay

## 2017-07-24 ENCOUNTER — Encounter: Payer: Self-pay | Admitting: Physician Assistant

## 2017-07-24 ENCOUNTER — Ambulatory Visit (INDEPENDENT_AMBULATORY_CARE_PROVIDER_SITE_OTHER): Payer: Medicare HMO | Admitting: Physician Assistant

## 2017-07-24 VITALS — BP 118/80 | HR 63 | Temp 98.1°F | Resp 14 | Ht 75.0 in | Wt 312.0 lb

## 2017-07-24 DIAGNOSIS — J441 Chronic obstructive pulmonary disease with (acute) exacerbation: Secondary | ICD-10-CM

## 2017-07-24 DIAGNOSIS — J449 Chronic obstructive pulmonary disease, unspecified: Secondary | ICD-10-CM | POA: Diagnosis not present

## 2017-07-24 MED ORDER — PREDNISONE 10 MG PO TABS
ORAL_TABLET | ORAL | 0 refills | Status: DC
Start: 2017-07-24 — End: 2017-08-15

## 2017-07-24 MED ORDER — IPRATROPIUM-ALBUTEROL 0.5-2.5 (3) MG/3ML IN SOLN
3.0000 mL | Freq: Once | RESPIRATORY_TRACT | Status: AC
  Administered 2017-07-24: 3 mL via RESPIRATORY_TRACT

## 2017-07-24 NOTE — Patient Instructions (Signed)
[facility]. Start the prednisone as directed. Continue other medications as directed. We will start a different antibiotic once x-ray has resulted.

## 2017-07-24 NOTE — Progress Notes (Signed)
Patient presents to clinic today for additional follow-up for bronchitis with COPD exacerbation. Patient has finished antibiotics and steroid burst as directed by ER physician. Is using duoneb 1-2 x daily as directed by ER physician. Patient endorses feeling much better until 2 days ago where he noted increased congestion and SOB with coughing spells. Notes sinus pressure with PND and mild sinus tenderness. Cough is sometimes productive. Denies fever, chills, aches or chest pain.   Past Medical History:  Diagnosis Date  . Allergic rhinitis   . Arthritis   . Broken leg   . Carbon dioxide poisoning   . Colon polyps   . Environmental allergies   . Excessive daytime sleepiness 07/13/2016  . GERD (gastroesophageal reflux disease)   . History of chicken pox   . 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 Kidney stones   . Obesity (BMI 30-39.9) 07/13/2016  . OSA (obstructive sleep apnea) 11/01/2016   Mild with AHI 11/hr  . Seasonal allergies   . Snoring 07/13/2016  . Tremors of nervous system     Current Outpatient Prescriptions on File Prior to Visit  Medication Sig Dispense Refill  . albuterol (PROVENTIL HFA;VENTOLIN HFA) 108 (90 Base) MCG/ACT inhaler Inhale 2 puffs into the lungs every 6 (six) hours as needed for wheezing or shortness of breath. 1 Inhaler 0  . Ascorbic Acid (VITAMIN C) 1000 MG tablet Take 1,000 mg by mouth daily.    Marland Kitchen aspirin 81 MG tablet Take 81 mg by mouth daily.    Marland Kitchen atorvastatin (LIPITOR) 10 MG tablet Take 1 tablet (10 mg total) by mouth daily. 30 tablet 1  . azelastine (ASTELIN) 0.1 % nasal spray USE 2 SPRAYS IN EACH NOSTRILS TWICE A DAY AS DIRECTED 30 mL 11  . Cetirizine HCl 10 MG CAPS Take 10 mg by mouth daily.    . Cholecalciferol (VITAMIN D-3) 1000 UNITS CAPS Take 1,000 Units by mouth daily.    Marland Kitchen gabapentin (NEURONTIN) 800 MG tablet Take 800 mg by mouth 5 (five) times daily.     Marland Kitchen  ipratropium-albuterol (DUONEB) 0.5-2.5 (3) MG/3ML SOLN Take 3 mLs by nebulization every 4 (four) hours as needed (wheezing and shortness of breath). 360 mL 1  . metoprolol tartrate (LOPRESSOR) 25 MG tablet Take 1 tablet (25 mg total) by mouth 2 (two) times daily. 60 tablet 1  . primidone (MYSOLINE) 50 MG tablet Take 150 mg by mouth 2 (two) times daily.    . tamsulosin (FLOMAX) 0.4 MG CAPS capsule TAKE 1 CAPSULE (0.4 MG TOTAL) BY MOUTH AT BEDTIME. 30 capsule 3  . vitamin B-12 (CYANOCOBALAMIN) 1000 MCG tablet Take 1,000 mcg by mouth daily.     No current facility-administered medications on file prior to visit.     Allergies  Allergen Reactions  . Codeine Nausea And Vomiting    All Codeine Related Drugs     Family History  Problem Relation Age of Onset  . Heart disease Mother   . Congestive Heart Failure Mother 40       Deceased  . Alcoholism Father        Living  . Arthritis Father   . Diabetes Maternal Aunt   . Cancer Other        PGGM  . Breast cancer Maternal Aunt   . Lung cancer Maternal Uncle   . Heart disease Brother   . Heart attack Brother   . Congestive Heart Failure Brother  49       Deceased  . Stroke Maternal Aunt   . Emphysema Brother        #2  . Arthritis Sister        #1  . Allergies Daughter   . Kidney Stones Daughter   . Gallbladder disease Daughter   . Migraines Daughter     Social History   Social History  . Marital status: Single    Spouse name: N/A  . Number of children: 3  . Years of education: N/A   Occupational History  . [employer]    Social History Main Topics  . Smoking status: Former Smoker    Packs/day: 2.00    Years: 30.00    Types: Cigarettes    Quit date: 11/20/2000  . Smokeless tobacco: Never Used  . Alcohol use No  . Drug use: No  . Sexual activity: Not Asked   Other Topics Concern  . None   Social History Narrative  . None   Review of Systems - See HPI.  All other ROS are negative.  BP 118/80   Pulse 63    Temp 98.1 F (36.7 C) (Oral)   Resp 14   Ht '6\' 3"'  (1.905 m)   Wt (!) 312 lb (141.5 kg)   SpO2 91%   BMI 39.00 kg/m   Physical Exam  Constitutional: He is oriented to person, place, and time and well-developed, well-nourished, and in no distress.  HENT:  Head: Normocephalic and atraumatic.  Right Ear: Tympanic membrane and external ear normal.  Left Ear: Tympanic membrane and external ear normal.  Nose: Mucosal edema and rhinorrhea present. Right sinus exhibits no maxillary sinus tenderness and no frontal sinus tenderness. Left sinus exhibits no maxillary sinus tenderness and no frontal sinus tenderness.  Mouth/Throat: Uvula is midline, oropharynx is clear and moist and mucous membranes are normal.  Eyes: Conjunctivae are normal.  Neck: Neck supple.  Cardiovascular: Normal rate, regular rhythm, normal heart sounds and intact distal pulses.   Pulmonary/Chest: Effort normal. No respiratory distress. He has wheezes. He has no rales. He exhibits no tenderness.  Neurological: He is alert and oriented to person, place, and time.  Skin: Skin is warm and dry. No rash noted.  Psychiatric: Affect normal.  Vitals reviewed.   Recent Results (from the past 2160 hour(s))  CBC     Status: None   Collection Time: 05/31/17  9:15 AM  Result Value Ref Range   WBC 5.7 4.0 - 10.5 K/uL   RBC 4.47 4.22 - 5.81 Mil/uL   Platelets 166.0 150.0 - 400.0 K/uL   Hemoglobin 15.2 13.0 - 17.0 g/dL   HCT 44.3 39.0 - 52.0 %   MCV 99.0 78.0 - 100.0 fl   MCHC 34.3 30.0 - 36.0 g/dL   RDW 13.1 11.5 - 15.5 %  Comprehensive metabolic panel     Status: None   Collection Time: 05/31/17  9:15 AM  Result Value Ref Range   Sodium 140 135 - 145 mEq/L   Potassium 4.9 3.5 - 5.1 mEq/L   Chloride 103 96 - 112 mEq/L   CO2 32 19 - 32 mEq/L   Glucose, Bld 91 70 - 99 mg/dL   BUN 15 6 - 23 mg/dL   Creatinine, Ser 1.00 0.40 - 1.50 mg/dL   Total Bilirubin 0.6 0.2 - 1.2 mg/dL   Alkaline Phosphatase 47 39 - 117 U/L   AST 25 0  - 37 U/L   ALT 20 0 -  53 U/L   Total Protein 7.2 6.0 - 8.3 g/dL   Albumin 4.3 3.5 - 5.2 g/dL   Calcium 9.8 8.4 - 10.5 mg/dL   GFR 79.59 >60.00 mL/min  PSA     Status: None   Collection Time: 05/31/17  9:15 AM  Result Value Ref Range   PSA 1.68 0.10 - 4.00 ng/mL  Lipid panel     Status: Abnormal   Collection Time: 05/31/17  9:15 AM  Result Value Ref Range   Cholesterol 197 0 - 200 mg/dL    Comment: ATP III Classification       Desirable:  < 200 mg/dL               Borderline High:  200 - 239 mg/dL          High:  > = 240 mg/dL   Triglycerides 97.0 0.0 - 149.0 mg/dL    Comment: Normal:  <150 mg/dLBorderline High:  150 - 199 mg/dL   HDL 38.10 (L) >39.00 mg/dL   VLDL 19.4 0.0 - 40.0 mg/dL   LDL Cholesterol 139 (H) 0 - 99 mg/dL   Total CHOL/HDL Ratio 5     Comment:                Men          Women1/2 Average Risk     3.4          3.3Average Risk          5.0          4.42X Average Risk          9.6          7.13X Average Risk          15.0          11.0                       NonHDL 158.41     Comment: NOTE:  Non-HDL goal should be 30 mg/dL higher than patient's LDL goal (i.e. LDL goal of < 70 mg/dL, would have non-HDL goal of < 100 mg/dL)  Hemoglobin A1c     Status: None   Collection Time: 05/31/17  9:15 AM  Result Value Ref Range   Hgb A1c MFr Bld 5.5 4.6 - 6.5 %    Comment: Glycemic Control Guidelines for People with Diabetes:Non Diabetic:  <6%Goal of Therapy: <7%Additional Action Suggested:  >8%   TSH     Status: None   Collection Time: 05/31/17  9:15 AM  Result Value Ref Range   TSH 1.68 0.35 - 4.50 uIU/mL  Urinalysis, Routine w reflex microscopic     Status: Abnormal   Collection Time: 05/31/17  9:15 AM  Result Value Ref Range   Color, Urine YELLOW Yellow;Lt. Yellow   APPearance CLEAR Clear   Specific Gravity, Urine >=1.030 (A) 1.000 - 1.030   pH 5.5 5.0 - 8.0   Total Protein, Urine NEGATIVE Negative   Urine Glucose NEGATIVE Negative   Ketones, ur NEGATIVE Negative    Bilirubin Urine NEGATIVE Negative   Hgb urine dipstick NEGATIVE Negative   Urobilinogen, UA 0.2 0.0 - 1.0   Leukocytes, UA NEGATIVE Negative   Nitrite NEGATIVE Negative   WBC, UA 0-2/hpf 0-2/hpf   RBC / HPF none seen 0-2/hpf   Mucus, UA Presence of (A) None   Squamous Epithelial / LPF Rare(0-4/hpf) Rare(0-4/hpf)  Comprehensive metabolic panel     Status: Abnormal  Collection Time: 07/07/17  9:04 PM  Result Value Ref Range   Sodium 136 135 - 145 mmol/L   Potassium 4.4 3.5 - 5.1 mmol/L   Chloride 99 (L) 101 - 111 mmol/L   CO2 28 22 - 32 mmol/L   Glucose, Bld 113 (H) 65 - 99 mg/dL   BUN 15 6 - 20 mg/dL   Creatinine, Ser 1.07 0.61 - 1.24 mg/dL   Calcium 9.4 8.9 - 10.3 mg/dL   Total Protein 7.6 6.5 - 8.1 g/dL   Albumin 3.8 3.5 - 5.0 g/dL   AST 28 15 - 41 U/L   ALT 30 17 - 63 U/L   Alkaline Phosphatase 48 38 - 126 U/L   Total Bilirubin 0.5 0.3 - 1.2 mg/dL   GFR calc non Af Amer >60 >60 mL/min   GFR calc Af Amer >60 >60 mL/min    Comment: (NOTE) The eGFR has been calculated using the CKD EPI equation. This calculation has not been validated in all clinical situations. eGFR's persistently <60 mL/min signify possible Chronic Kidney Disease.    Anion gap 9 5 - 15  CBC with Differential     Status: Abnormal   Collection Time: 07/07/17  9:04 PM  Result Value Ref Range   WBC 16.5 (H) 4.0 - 10.5 K/uL   RBC 4.46 4.22 - 5.81 MIL/uL   Hemoglobin 14.9 13.0 - 17.0 g/dL   HCT 43.7 39.0 - 52.0 %   MCV 98.0 78.0 - 100.0 fL   MCH 33.4 26.0 - 34.0 pg   MCHC 34.1 30.0 - 36.0 g/dL   RDW 12.1 11.5 - 15.5 %   Platelets 237 150 - 400 K/uL   Neutrophils Relative % 50 %   Neutro Abs 8.2 (H) 1.7 - 7.7 K/uL   Lymphocytes Relative 20 %   Lymphs Abs 3.4 0.7 - 4.0 K/uL   Monocytes Relative 8 %   Monocytes Absolute 1.4 (H) 0.1 - 1.0 K/uL   Eosinophils Relative 21 %   Eosinophils Absolute 3.5 (H) 0.0 - 0.7 K/uL   Basophils Relative 1 %   Basophils Absolute 0.1 0.0 - 0.1 K/uL  Protime-INR      Status: None   Collection Time: 07/07/17  9:04 PM  Result Value Ref Range   Prothrombin Time 15.0 11.4 - 15.2 seconds   INR 1.18   Troponin I     Status: Abnormal   Collection Time: 07/07/17  9:04 PM  Result Value Ref Range   Troponin I 0.06 (HH) <0.03 ng/mL    Comment: CRITICAL RESULT CALLED TO, READ BACK BY AND VERIFIED WITH: C.CHRISCOE,RN 2343 07/07/17 M.CAMPBELL   Urinalysis, Routine w reflex microscopic     Status: None   Collection Time: 07/07/17  9:06 PM  Result Value Ref Range   Color, Urine YELLOW YELLOW   APPearance CLEAR CLEAR   Specific Gravity, Urine 1.021 1.005 - 1.030   pH 7.0 5.0 - 8.0   Glucose, UA NEGATIVE NEGATIVE mg/dL   Hgb urine dipstick NEGATIVE NEGATIVE   Bilirubin Urine NEGATIVE NEGATIVE   Ketones, ur NEGATIVE NEGATIVE mg/dL   Protein, ur NEGATIVE NEGATIVE mg/dL   Nitrite NEGATIVE NEGATIVE   Leukocytes, UA NEGATIVE NEGATIVE  Legionella Pneumophila Serogp 1 Ur Ag     Status: None   Collection Time: 07/07/17  9:06 PM  Result Value Ref Range   L. pneumophila Serogp 1 Ur Ag Negative Negative    Comment: (NOTE) Presumptive negative for L. pneumophila serogroup 1 antigen in urine,  suggesting no recent or current infection. Legionnaires' disease cannot be ruled out since other serogroups and species may also cause disease. Performed At: [facility] [place], [place] [id number] Lindon Romp MD AS:[id number]   Strep pneumoniae urinary antigen     Status: None   Collection Time: 07/07/17  9:06 PM  Result Value Ref Range   Strep Pneumo Urinary Antigen NEGATIVE NEGATIVE    Comment:        Infection due to S. pneumoniae cannot be absolutely ruled out since the antigen present may be below the detection limit of the test.   Culture, blood (Routine x 2)     Status: None   Collection Time: 07/07/17  9:08 PM  Result Value Ref Range   Specimen Description BLOOD RIGHT ARM    Special Requests      BOTTLES DRAWN AEROBIC AND  ANAEROBIC Blood Culture adequate volume   Culture NO GROWTH 5 DAYS    Report Status 07/12/2017 FINAL   Culture, blood (Routine x 2)     Status: None   Collection Time: 07/07/17  9:15 PM  Result Value Ref Range   Specimen Description BLOOD RIGHT HAND    Special Requests      IN PEDIATRIC BOTTLE Blood Culture results may not be optimal due to an excessive volume of blood received in culture bottles   Culture NO GROWTH 5 DAYS    Report Status 07/12/2017 FINAL   I-Stat CG4 Lactic Acid, ED     Status: None   Collection Time: 07/07/17  9:39 PM  Result Value Ref Range   Lactic Acid, Venous 1.41 0.5 - 1.9 mmol/L  I-Stat CG4 Lactic Acid, ED     Status: None   Collection Time: 07/07/17 11:59 PM  Result Value Ref Range   Lactic Acid, Venous 0.60 0.5 - 1.9 mmol/L  Brain natriuretic peptide     Status: None   Collection Time: 07/08/17 12:51 AM  Result Value Ref Range   B Natriuretic Peptide 71.3 0.0 - 100.0 pg/mL  HIV antibody (Routine Testing)     Status: None   Collection Time: 07/08/17  2:54 AM  Result Value Ref Range   HIV Screen 4th Generation wRfx Non Reactive Non Reactive    Comment: (NOTE) Performed At: [facility] [place], [place] [id number] Lindon Romp MD DE:[id number]   CBC     Status: Abnormal   Collection Time: 07/08/17  2:54 AM  Result Value Ref Range   WBC 11.4 (H) 4.0 - 10.5 K/uL   RBC 4.44 4.22 - 5.81 MIL/uL   Hemoglobin 15.2 13.0 - 17.0 g/dL   HCT 43.7 39.0 - 52.0 %   MCV 98.4 78.0 - 100.0 fL   MCH 34.2 (H) 26.0 - 34.0 pg   MCHC 34.8 30.0 - 36.0 g/dL   RDW 12.4 11.5 - 15.5 %   Platelets 129 (L) 150 - 400 K/uL  Basic metabolic panel     Status: Abnormal   Collection Time: 07/08/17  2:54 AM  Result Value Ref Range   Sodium 132 (L) 135 - 145 mmol/L   Potassium 4.7 3.5 - 5.1 mmol/L   Chloride 99 (L) 101 - 111 mmol/L   CO2 23 22 - 32 mmol/L   Glucose, Bld 162 (H) 65 - 99 mg/dL   BUN 15 6 - 20 mg/dL   Creatinine, Ser 1.06 0.61 - 1.24  mg/dL   Calcium 9.1 8.9 - 10.3 mg/dL   GFR  calc non Af Amer >60 >60 mL/min   GFR calc Af Amer >60 >60 mL/min    Comment: (NOTE) The eGFR has been calculated using the CKD EPI equation. This calculation has not been validated in all clinical situations. eGFR's persistently <60 mL/min signify possible Chronic Kidney Disease.    Anion gap 10 5 - 15  Troponin I (q 6hr x 3)     Status: Abnormal   Collection Time: 07/08/17  2:54 AM  Result Value Ref Range   Troponin I 0.05 (HH) <0.03 ng/mL    Comment: CRITICAL VALUE NOTED.  VALUE IS CONSISTENT WITH PREVIOUSLY REPORTED AND CALLED VALUE.  Troponin I (q 6hr x 3)     Status: None   Collection Time: 07/08/17  8:46 AM  Result Value Ref Range   Troponin I <0.03 <0.03 ng/mL  Troponin I (q 6hr x 3)     Status: Abnormal   Collection Time: 07/08/17  2:22 PM  Result Value Ref Range   Troponin I 0.03 (HH) <0.03 ng/mL    Comment: CRITICAL RESULT CALLED TO, READ BACK BY AND VERIFIED WITH: T.GOSS RN @ 1516 07/08/17 BY C.EDENS   CBC     Status: Abnormal   Collection Time: 07/09/17  2:44 AM  Result Value Ref Range   WBC 17.4 (H) 4.0 - 10.5 K/uL   RBC 4.53 4.22 - 5.81 MIL/uL   Hemoglobin 14.8 13.0 - 17.0 g/dL   HCT 43.6 39.0 - 52.0 %   MCV 96.2 78.0 - 100.0 fL   MCH 32.7 26.0 - 34.0 pg   MCHC 33.9 30.0 - 36.0 g/dL   RDW 12.0 11.5 - 15.5 %   Platelets 250 150 - 400 K/uL  Comprehensive metabolic panel     Status: Abnormal   Collection Time: 07/09/17  2:44 AM  Result Value Ref Range   Sodium 135 135 - 145 mmol/L   Potassium 4.9 3.5 - 5.1 mmol/L   Chloride 102 101 - 111 mmol/L   CO2 27 22 - 32 mmol/L   Glucose, Bld 196 (H) 65 - 99 mg/dL   BUN 20 6 - 20 mg/dL   Creatinine, Ser 1.07 0.61 - 1.24 mg/dL   Calcium 9.4 8.9 - 10.3 mg/dL   Total Protein 7.1 6.5 - 8.1 g/dL   Albumin 3.3 (L) 3.5 - 5.0 g/dL   AST 22 15 - 41 U/L   ALT 25 17 - 63 U/L   Alkaline Phosphatase 45 38 - 126 U/L   Total Bilirubin 0.5 0.3 - 1.2 mg/dL   GFR calc non Af Amer >60  >60 mL/min   GFR calc Af Amer >60 >60 mL/min    Comment: (NOTE) The eGFR has been calculated using the CKD EPI equation. This calculation has not been validated in all clinical situations. eGFR's persistently <60 mL/min signify possible Chronic Kidney Disease.    Anion gap 6 5 - 15  CBC w/Diff     Status: Abnormal   Collection Time: 07/13/17 11:56 AM  Result Value Ref Range   WBC 15.6 Repeated and verified X2. (H) 4.0 - 10.5 K/uL   RBC 4.57 4.22 - 5.81 Mil/uL   Hemoglobin 15.4 13.0 - 17.0 g/dL   HCT 45.5 39.0 - 52.0 %   MCV 99.6 78.0 - 100.0 fl   MCHC 33.8 30.0 - 36.0 g/dL   RDW 12.8 11.5 - 15.5 %   Platelets 242.0 150.0 - 400.0 K/uL   Neutrophils Relative % 75.4 43.0 - 77.0 %  Lymphocytes Relative 14.5 12.0 - 46.0 %   Monocytes Relative 4.5 3.0 - 12.0 %   Eosinophils Relative 5.3 (H) 0.0 - 5.0 %   Basophils Relative 0.3 0.0 - 3.0 %   Neutro Abs 11.7 (H) 1.4 - 7.7 K/uL   Lymphs Abs 2.3 0.7 - 4.0 K/uL   Monocytes Absolute 0.7 0.1 - 1.0 K/uL   Eosinophils Absolute 0.8 (H) 0.0 - 0.7 K/uL   Basophils Absolute 0.0 0.0 - 0.1 K/uL  Basic metabolic panel     Status: Abnormal   Collection Time: 07/13/17 11:56 AM  Result Value Ref Range   Sodium 136 135 - 145 mEq/L   Potassium 4.7 3.5 - 5.1 mEq/L   Chloride 101 96 - 112 mEq/L   CO2 29 19 - 32 mEq/L   Glucose, Bld 114 (H) 70 - 99 mg/dL   BUN 19 6 - 23 mg/dL   Creatinine, Ser 0.98 0.40 - 1.50 mg/dL   Calcium 9.5 8.4 - 10.5 mg/dL   GFR 81.43 >60.00 mL/min    Assessment/Plan: 1. COPD exacerbation ([place]) Improvement in wheeze with in office Duoneb. Will obtain repeat CXR today. Steroid taper started today. Will verify no CAP on xray and start ABX for sinusitis. - ipratropium-albuterol (DUONEB) 0.5-2.5 (3) MG/3ML nebulizer solution 3 mL; Take 3 mLs by nebulization once. - DG Chest 2 View; Future   Leeanne Rio, PA-C

## 2017-07-24 NOTE — Progress Notes (Signed)
Pre visit review using our clinic review tool, if applicable. No additional management support is needed unless otherwise documented below in the visit note. 

## 2017-07-24 NOTE — Patient Outreach (Signed)
[facility]) Care Management  07/24/2017  Warren RidgeWilliam Baker [birth year] [id number]  Transition of care  Week # 1 Referral date: 07/10/17 Referral source: Transition of care / status post hospital discharge on 07/09/17 from [name]:  Transition of care/ COPD Insurance: [employer] Providers: Marcelline MatesWilliam Martin, PA Social support"  Daughter, Paticia StackRebecca Hanyok. Patient gave verbal authorization to speak with his daughter regarding all of his personal health information.  Attempt #2  Second telephone call to patient regarding transition of care follow up.  Unable to reach patient. HIPAA compliant voice message left with call back phone number.   PLAN;  RNCM will attempt 3rd telephone call to patient within 1 week.  [name] RN,BSN,CCM [facility] Telephonic  [phone]

## 2017-07-25 ENCOUNTER — Other Ambulatory Visit: Payer: Self-pay | Admitting: *Deleted

## 2017-07-25 ENCOUNTER — Other Ambulatory Visit: Payer: Self-pay

## 2017-07-25 MED ORDER — AMOXICILLIN-POT CLAVULANATE 875-125 MG PO TABS: 1.0000 | ORAL_TABLET | Freq: Two times a day (BID) | ORAL | 0 refills | Status: AC

## 2017-07-25 NOTE — Patient Outreach (Signed)
[facility]) Care Management  [facility]  07/25/2017   Warren RidgeWilliam Baker [birth year] [id number]  Initial telephone assessment:  Subjective: Telephone call to patient. HIPAA verified.  Patient states he has developed a sinus infection. Patient reports he saw his primary Md on yesterday. States he is on prednisone and had a chest xray on yesterday. Patient states the doctor mentioned he may have bronchitis as well.  Patient states he is suppose to get a new CPAP machine within the next few weeks.  Patient states he takes his medications as prescribed. Patient states he uses his nebulizer 2 times per day and uses his rescue inhaler approximately 2-3 times per week.  Patient states he has started walking at least 30 min per day 6 days per week.  Patient states [facility]. [place] longer to arrive.  RNCM discussed COPD action plan with patient. Advised patient to call doctor for early onset of symptoms. Advised to call 911 for severe symptoms.  Patient verbally [facility] to [facility].   Objective: see assessment  Encounter Medications:  Outpatient Encounter Prescriptions as of 07/25/2017  Medication Sig  . albuterol (PROVENTIL HFA;VENTOLIN HFA) 108 (90 Base) MCG/ACT inhaler Inhale 2 puffs into the lungs every 6 (six) hours as needed for wheezing or shortness of breath.  . Ascorbic Acid (VITAMIN C) 1000 MG tablet Take 1,000 mg by mouth daily.  Marland Kitchen. aspirin 81 MG tablet Take 81 mg by mouth daily.  Marland Kitchen. atorvastatin (LIPITOR) 10 MG tablet Take 1 tablet (10 mg total) by mouth daily.  Marland Kitchen. azelastine (ASTELIN) 0.1 % nasal spray USE 2 SPRAYS IN EACH NOSTRILS TWICE A DAY AS DIRECTED  . Cetirizine HCl 10 MG CAPS Take 10 mg by mouth daily.  . Cholecalciferol (VITAMIN D-3) 1000 UNITS CAPS Take 1,000 Units by mouth daily.  Marland Kitchen. gabapentin (NEURONTIN) 800 MG tablet Take 800  mg by mouth 5 (five) times daily.   Marland Kitchen. ipratropium-albuterol (DUONEB) 0.5-2.5 (3) MG/3ML SOLN Take 3 mLs by nebulization every 4 (four) hours as needed (wheezing and shortness of breath).  . predniSONE (DELTASONE) 10 MG tablet Take 1 tablet QID x 3 days, then 1 tablet TID x 3 days, then 1 tablet BID x 3 days, then 1 tablet daily x 3 days.  . primidone (MYSOLINE) 50 MG tablet Take 150 mg by mouth 2 (two) times daily.  . propranolol (INDERAL) 40 MG tablet Take 40 mg by mouth daily.  . tamsulosin (FLOMAX) 0.4 MG CAPS capsule TAKE 1 CAPSULE (0.4 MG TOTAL) BY MOUTH AT BEDTIME.  . vitamin B-12 (CYANOCOBALAMIN) 1000 MCG tablet Take 1,000 mcg by mouth daily.  . metoprolol tartrate (LOPRESSOR) 25 MG tablet Take 1 tablet (25 mg total) by mouth 2 (two) times daily.   No facility-administered encounter medications on file as of 07/25/2017.     Functional Status:  In your present state of health, do you have any difficulty performing the following activities: 07/25/2017 07/08/2017  Hearing? N N  Vision? N N  Difficulty concentrating or making decisions? N N  Walking or climbing stairs? N N  Dressing or bathing? N N  Doing errands, shopping? N N  Preparing Food and eating ? N -  Using the Toilet? N -  In the past six months, have you accidently leaked urine? N -  Do you have problems with loss of bowel control?  N -  Managing your Medications? N -  Managing your Finances? N -  Housekeeping or managing your Housekeeping? N -  Some recent data might be hidden    Fall/Depression Screening: Fall Risk  07/25/2017 05/31/2017 02/07/2017  Falls in the past year? No No No  Number falls in past yr: - - -  Injury with Fall? - - -  Risk for fall due to : - - -  Risk for fall due to: Comment - - -   PHQ 2/9 Scores 07/25/2017 05/31/2017 02/07/2017 07/31/2015  PHQ - 2 Score 0 0 0 0  PHQ- 9 Score - 0 - -    [facility] CM Care Plan Problem One     Most Recent Value  Care Plan Problem One  Potential for readmission due to  recent hospitalization  Role Documenting the Problem One  Care Management Telephonic Coordinator  Care Plan for Problem One  Active  [facility] Long Term Goal   Patient will report he has not been readmitted to the hospital in 60 days  [facility] Long Term Goal Start Date  07/25/17  Interventions for Problem One Long Term Goal  RNCM advised patient to keep follow up appointments, take medication as prescribed.  RNCM has sent patient EMMI education material.   [facility] CM Short Term Goal #1   Patient will report 3 symptoms in yellow zone of COPD action plan within 30 days.   [facility] CM Short Term Goal #1 Start Date  07/25/17  Interventions for Short Term Goal #1  EMMI copd Education material sent to patient.  [facility] CM Short Term Goal #2   Patient will be able to verbalize which COPD action plan zone he is in within 30 days  [facility] CM Short Term Goal #2 Start Date  07/25/17  Interventions for Short Term Goal #2  RNCM sent patient COPD action plan and education material.        Assessment: ongoing transition of care follow up and COPD disease management. Patient newly diagnosed with COPD at admission on 07/07/17.   Plan: RNCM will follow up with patient within 1 week.  RNCM will send / forward completed assessment to patients primary Md Patient will verbalized receipt of [facility] care management information and education material at next outreach.   George Ina RN,BSN,CCM [facility] Telephonic  [phone]

## 2017-07-26 ENCOUNTER — Ambulatory Visit: Payer: Self-pay

## 2017-07-26 ENCOUNTER — Other Ambulatory Visit: Payer: Self-pay | Admitting: Physician Assistant

## 2017-07-26 DIAGNOSIS — E785 Hyperlipidemia, unspecified: Secondary | ICD-10-CM

## 2017-07-26 NOTE — Addendum Note (Signed)
Addended by: George InaGREEN, Bettyjean Stefanski E on: 07/26/2017 02:34 PM   Modules accepted: Orders

## 2017-08-02 ENCOUNTER — Other Ambulatory Visit: Payer: Self-pay

## 2017-08-02 NOTE — Patient Outreach (Signed)
[facility]) Care Management  08/02/2017  Warren RidgeWilliam Baker Oct 13, 1951 [id number]  Transition of care  Week # 2 Referral date: 07/10/17 Referral source: Transition of care / status post hospital discharge on 07/09/17 from [facility]: Transition of care/ COPD Insurance: [employer] Providers: Marcelline MatesWilliam Martin, PA Social support" Daughter, Paticia StackRebecca Hanyok. Patient gave verbal authorization to speak with his daughter regarding all of his personal health information.    Telephone call to patient for transition of care follow up. HIPAA verified with patient.  Patient states he is doing very well. Patient denies any new onset of symptoms. Patient states he still has some shortness of breath with more exertion. Patient states he has started walking everyday approximately 20-30 minutes. Patient states he takes his breathing treatments 2 times per day. Patient reports he sees his doctor on tomorrow for follow up regarding his bronchitis. Patient states he completed his antibiotics and will complete the steroids by Sunday 08/05/17.   [facility] but did not receive the consent form. RNCM will resend consent form to patient.  RNCM reviewed COPD action plan and zones.  Patient verbally agreed to next [facility].   ASSESSMENT:  Ongoing transition of care follow up  PLAN; RNCM will follow up with patient within 1 week. Patient will report he had follow up appointment with his primary MD and completed his steroid medication.  RNCM will resend patient consent form and COPD action plan.   George [name] Betania Dizon RN,BSN,CCM [facility] Telephonic  [phone]

## 2017-08-03 ENCOUNTER — Encounter: Payer: Self-pay | Admitting: Physician Assistant

## 2017-08-03 ENCOUNTER — Encounter: Payer: Self-pay | Admitting: *Deleted

## 2017-08-03 ENCOUNTER — Ambulatory Visit (INDEPENDENT_AMBULATORY_CARE_PROVIDER_SITE_OTHER): Payer: Medicare HMO | Admitting: Physician Assistant

## 2017-08-03 ENCOUNTER — Ambulatory Visit: Payer: Medicare HMO | Admitting: Physician Assistant

## 2017-08-03 VITALS — BP 134/70 | HR 52 | Temp 97.6°F | Resp 14 | Ht 75.0 in | Wt 320.0 lb

## 2017-08-03 DIAGNOSIS — J310 Chronic rhinitis: Secondary | ICD-10-CM

## 2017-08-03 DIAGNOSIS — J441 Chronic obstructive pulmonary disease with (acute) exacerbation: Secondary | ICD-10-CM | POA: Diagnosis not present

## 2017-08-03 MED ORDER — LEVOCETIRIZINE DIHYDROCHLORIDE 5 MG PO TABS: 5.0000 mg | ORAL_TABLET | Freq: Every evening | ORAL | 3 refills | Status: DC

## 2017-08-03 MED ORDER — MONTELUKAST SODIUM 10 MG PO TABS: 10.0000 mg | ORAL_TABLET | Freq: Every day | ORAL | 3 refills | Status: DC

## 2017-08-03 NOTE — Patient Instructions (Addendum)
Please stop the Zyrtec and start daily Xyzal.  Also start the Singulair as directed. Finish the steroid taper. You can stop the nebulizer treatments as it does not seem that you need them. Continue inhalers as directed.  Continue saline nasal rinses.  I am setting you up for assessment with an ENT provider.  If anything worsens or new symptoms develop please return to office ASAP.

## 2017-08-03 NOTE — Progress Notes (Signed)
Patient presents to clinic today for follow-up of COPD exacerbation. At last visit, patient started on prednisone taper. Is taking as directed with significant improvement in wheezing. Has cut back on duoneb at home. No need for albuterol recently. States the biggest issue at present is the continued nasal drainage. Has history of chronic rhinitis but states this has been the worst year for this. Is taking Zyrtec and using Astelin and saline nasal rinse with only minimal improvement.   Past Medical History:  Diagnosis Date  . Allergic rhinitis   . Arthritis   . Broken leg   . Bronchitis   . Carbon dioxide poisoning   . Colon polyps   . COPD (chronic obstructive pulmonary disease) ([place])   . Environmental allergies   . Excessive daytime sleepiness 07/13/2016  . GERD (gastroesophageal reflux disease)   . History of chicken pox   . 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 Kidney stones   . Obesity (BMI 30-39.9) 07/13/2016  . OSA (obstructive sleep apnea) 11/01/2016   Mild with AHI 11/hr  . Seasonal allergies   . Snoring 07/13/2016  . Tremors of nervous system     Current Outpatient Prescriptions on File Prior to Visit  Medication Sig Dispense Refill  . albuterol (PROVENTIL HFA;VENTOLIN HFA) 108 (90 Base) MCG/ACT inhaler Inhale 2 puffs into the lungs every 6 (six) hours as needed for wheezing or shortness of breath. 1 Inhaler 0  . Ascorbic Acid (VITAMIN C) 1000 MG tablet Take 1,000 mg by mouth daily.    Marland Kitchen aspirin 81 MG tablet Take 81 mg by mouth daily.    Marland Kitchen atorvastatin (LIPITOR) 10 MG tablet TAKE 1 TABLET BY MOUTH EVERY DAY 30 tablet 6  . azelastine (ASTELIN) 0.1 % nasal spray USE 2 SPRAYS IN EACH NOSTRILS TWICE A DAY AS DIRECTED 30 mL 11  . Cetirizine HCl 10 MG CAPS Take 10 mg by mouth daily.    . Cholecalciferol (VITAMIN D-3) 1000 UNITS CAPS Take 1,000 Units by mouth daily.    Marland Kitchen gabapentin (NEURONTIN) 800 MG tablet  Take 800 mg by mouth 5 (five) times daily.     . metoprolol tartrate (LOPRESSOR) 25 MG tablet Take 1 tablet (25 mg total) by mouth 2 (two) times daily. 60 tablet 1  . predniSONE (DELTASONE) 10 MG tablet Take 1 tablet QID x 3 days, then 1 tablet TID x 3 days, then 1 tablet BID x 3 days, then 1 tablet daily x 3 days. 30 tablet 0  . primidone (MYSOLINE) 50 MG tablet Take 150 mg by mouth 2 (two) times daily.    . tamsulosin (FLOMAX) 0.4 MG CAPS capsule TAKE 1 CAPSULE (0.4 MG TOTAL) BY MOUTH AT BEDTIME. 30 capsule 3  . vitamin B-12 (CYANOCOBALAMIN) 1000 MCG tablet Take 1,000 mcg by mouth daily.     No current facility-administered medications on file prior to visit.     Allergies  Allergen Reactions  . Codeine Nausea And Vomiting    All Codeine Related Drugs     Family History  Problem Relation Age of Onset  . Heart disease Mother   . Congestive Heart Failure Mother 91       Deceased  . Alcoholism Father        Living  . Arthritis Father   . Diabetes Maternal Aunt   . Cancer Other        PGGM  . Breast cancer Maternal Aunt   .  Lung cancer Maternal Uncle   . Heart disease Brother   . Heart attack Brother   . Congestive Heart Failure Brother 21       Deceased  . Stroke Maternal Aunt   . Emphysema Brother        #2  . Arthritis Sister        #1  . Allergies Daughter   . Kidney Stones Daughter   . Gallbladder disease Daughter   . Migraines Daughter     Social History   Social History  . Marital status: Single    Spouse name: N/A  . Number of children: 3  . Years of education: N/A   Occupational History  . [employer] Technician    Social History Main Topics  . Smoking status: Former Smoker    Packs/day: 2.00    Years: 30.00    Types: Cigarettes    Quit date: 11/20/2000  . Smokeless tobacco: Never Used  . Alcohol use No  . Drug use: No  . Sexual activity: Not Asked   Other Topics Concern  . None   Social History Narrative  . None   Review of Systems - See HPI.  All  other ROS are negative.  BP 134/70   Pulse (!) 52   Temp 97.6 F (36.4 C) (Oral)   Resp 14   Ht '6\' 3"'  (1.905 m)   Wt (!) 320 lb (145.2 kg)   SpO2 94%   BMI 40.00 kg/m   Physical Exam  Constitutional: He is oriented to person, place, and time and well-developed, well-nourished, and in no distress.  HENT:  Head: Normocephalic and atraumatic.  Right Ear: Tympanic membrane normal.  Left Ear: Tympanic membrane normal.  Nose: Mucosal edema and rhinorrhea present. Right sinus exhibits no maxillary sinus tenderness and no frontal sinus tenderness. Left sinus exhibits no maxillary sinus tenderness and no frontal sinus tenderness.  Mouth/Throat: Uvula is midline, oropharynx is clear and moist and mucous membranes are normal.  Neck: Neck supple.  Cardiovascular: Normal rate, regular rhythm, normal heart sounds and intact distal pulses.   Pulmonary/Chest: Effort normal and breath sounds normal. No respiratory distress. He has no wheezes. He has no rales. He exhibits no tenderness.  Lymphadenopathy:    He has no cervical adenopathy.  Neurological: He is alert and oriented to person, place, and time.  Skin: Skin is warm and dry. No rash noted.  Psychiatric: Affect normal.  Vitals reviewed.   Recent Results (from the past 2160 hour(s))  CBC     Status: None   Collection Time: 05/31/17  9:15 AM  Result Value Ref Range   WBC 5.7 4.0 - 10.5 K/uL   RBC 4.47 4.22 - 5.81 Mil/uL   Platelets 166.0 150.0 - 400.0 K/uL   Hemoglobin 15.2 13.0 - 17.0 g/dL   HCT 44.3 39.0 - 52.0 %   MCV 99.0 78.0 - 100.0 fl   MCHC 34.3 30.0 - 36.0 g/dL   RDW 13.1 11.5 - 15.5 %  Comprehensive metabolic panel     Status: None   Collection Time: 05/31/17  9:15 AM  Result Value Ref Range   Sodium 140 135 - 145 mEq/L   Potassium 4.9 3.5 - 5.1 mEq/L   Chloride 103 96 - 112 mEq/L   CO2 32 19 - 32 mEq/L   Glucose, Bld 91 70 - 99 mg/dL   BUN 15 6 - 23 mg/dL   Creatinine, Ser 1.00 0.40 - 1.50 mg/dL   Total Bilirubin  0.6 0.2 - 1.2 mg/dL   Alkaline Phosphatase 47 39 - 117 U/L   AST 25 0 - 37 U/L   ALT 20 0 - 53 U/L   Total Protein 7.2 6.0 - 8.3 g/dL   Albumin 4.3 3.5 - 5.2 g/dL   Calcium 9.8 8.4 - 10.5 mg/dL   GFR 79.59 >60.00 mL/min  PSA     Status: None   Collection Time: 05/31/17  9:15 AM  Result Value Ref Range   PSA 1.68 0.10 - 4.00 ng/mL  Lipid panel     Status: Abnormal   Collection Time: 05/31/17  9:15 AM  Result Value Ref Range   Cholesterol 197 0 - 200 mg/dL    Comment: ATP III Classification       Desirable:  < 200 mg/dL               Borderline High:  200 - 239 mg/dL          High:  > = 240 mg/dL   Triglycerides 97.0 0.0 - 149.0 mg/dL    Comment: Normal:  <150 mg/dLBorderline High:  150 - 199 mg/dL   HDL 38.10 (L) >39.00 mg/dL   VLDL 19.4 0.0 - 40.0 mg/dL   LDL Cholesterol 139 (H) 0 - 99 mg/dL   Total CHOL/HDL Ratio 5     Comment:                Men          Women1/2 Average Risk     3.4          3.3Average Risk          5.0          4.42X Average Risk          9.6          7.13X Average Risk          15.0          11.0                       NonHDL 158.41     Comment: NOTE:  Non-HDL goal should be 30 mg/dL higher than patient's LDL goal (i.e. LDL goal of < 70 mg/dL, would have non-HDL goal of < 100 mg/dL)  Hemoglobin A1c     Status: None   Collection Time: 05/31/17  9:15 AM  Result Value Ref Range   Hgb A1c MFr Bld 5.5 4.6 - 6.5 %    Comment: Glycemic Control Guidelines for People with Diabetes:Non Diabetic:  <6%Goal of Therapy: <7%Additional Action Suggested:  >8%   TSH     Status: None   Collection Time: 05/31/17  9:15 AM  Result Value Ref Range   TSH 1.68 0.35 - 4.50 uIU/mL  Urinalysis, Routine w reflex microscopic     Status: Abnormal   Collection Time: 05/31/17  9:15 AM  Result Value Ref Range   Color, Urine YELLOW Yellow;Lt. Yellow   APPearance CLEAR Clear   Specific Gravity, Urine >=1.030 (A) 1.000 - 1.030   pH 5.5 5.0 - 8.0   Total Protein, Urine NEGATIVE Negative    Urine Glucose NEGATIVE Negative   Ketones, ur NEGATIVE Negative   Bilirubin Urine NEGATIVE Negative   Hgb urine dipstick NEGATIVE Negative   Urobilinogen, UA 0.2 0.0 - 1.0   Leukocytes, UA NEGATIVE Negative   Nitrite NEGATIVE Negative   WBC, UA 0-2/hpf 0-2/hpf   RBC / HPF none seen  0-2/hpf   Mucus, UA Presence of (A) None   Squamous Epithelial / LPF Rare(0-4/hpf) Rare(0-4/hpf)  Comprehensive metabolic panel     Status: Abnormal   Collection Time: 07/07/17  9:04 PM  Result Value Ref Range   Sodium 136 135 - 145 mmol/L   Potassium 4.4 3.5 - 5.1 mmol/L   Chloride 99 (L) 101 - 111 mmol/L   CO2 28 22 - 32 mmol/L   Glucose, Bld 113 (H) 65 - 99 mg/dL   BUN 15 6 - 20 mg/dL   Creatinine, Ser 1.07 0.61 - 1.24 mg/dL   Calcium 9.4 8.9 - 10.3 mg/dL   Total Protein 7.6 6.5 - 8.1 g/dL   Albumin 3.8 3.5 - 5.0 g/dL   AST 28 15 - 41 U/L   ALT 30 17 - 63 U/L   Alkaline Phosphatase 48 38 - 126 U/L   Total Bilirubin 0.5 0.3 - 1.2 mg/dL   GFR calc non Af Amer >60 >60 mL/min   GFR calc Af Amer >60 >60 mL/min    Comment: (NOTE) The eGFR has been calculated using the CKD EPI equation. This calculation has not been validated in all clinical situations. eGFR's persistently <60 mL/min signify possible Chronic Kidney Disease.    Anion gap 9 5 - 15  CBC with Differential     Status: Abnormal   Collection Time: 07/07/17  9:04 PM  Result Value Ref Range   WBC 16.5 (H) 4.0 - 10.5 K/uL   RBC 4.46 4.22 - 5.81 MIL/uL   Hemoglobin 14.9 13.0 - 17.0 g/dL   HCT 43.7 39.0 - 52.0 %   MCV 98.0 78.0 - 100.0 fL   MCH 33.4 26.0 - 34.0 pg   MCHC 34.1 30.0 - 36.0 g/dL   RDW 12.1 11.5 - 15.5 %   Platelets 237 150 - 400 K/uL   Neutrophils Relative % 50 %   Neutro Abs 8.2 (H) 1.7 - 7.7 K/uL   Lymphocytes Relative 20 %   Lymphs Abs 3.4 0.7 - 4.0 K/uL   Monocytes Relative 8 %   Monocytes Absolute 1.4 (H) 0.1 - 1.0 K/uL   Eosinophils Relative 21 %   Eosinophils Absolute 3.5 (H) 0.0 - 0.7 K/uL   Basophils Relative  1 %   Basophils Absolute 0.1 0.0 - 0.1 K/uL  Protime-INR     Status: None   Collection Time: 07/07/17  9:04 PM  Result Value Ref Range   Prothrombin Time 15.0 11.4 - 15.2 seconds   INR 1.18   Troponin I     Status: Abnormal   Collection Time: 07/07/17  9:04 PM  Result Value Ref Range   Troponin I 0.06 (HH) <0.03 ng/mL    Comment: CRITICAL RESULT CALLED TO, READ BACK BY AND VERIFIED WITH: C.CHRISCOE,RN [id number] 07/07/17 M.CAMPBELL   Urinalysis, Routine w reflex microscopic     Status: None   Collection Time: 07/07/17  9:06 PM  Result Value Ref Range   Color, Urine YELLOW YELLOW   APPearance CLEAR CLEAR   Specific Gravity, Urine 1.021 1.005 - 1.030   pH 7.0 5.0 - 8.0   Glucose, UA NEGATIVE NEGATIVE mg/dL   Hgb urine dipstick NEGATIVE NEGATIVE   Bilirubin Urine NEGATIVE NEGATIVE   Ketones, ur NEGATIVE NEGATIVE mg/dL   Protein, ur NEGATIVE NEGATIVE mg/dL   Nitrite NEGATIVE NEGATIVE   Leukocytes, UA NEGATIVE NEGATIVE  Legionella Pneumophila Serogp 1 Ur Ag     Status: None   Collection Time: 07/07/17  9:06 PM  Result Value Ref Range   L. pneumophila Serogp 1 Ur Ag Negative Negative    Comment: (NOTE) Presumptive negative for L. pneumophila serogroup 1 antigen in urine, suggesting no recent or current infection. Legionnaires' disease cannot be ruled out since other serogroups and species may also cause disease. Performed At: [facility] [place], [place] [id number] Lindon Romp MD DJ:[id number]   Strep pneumoniae urinary antigen     Status: None   Collection Time: 07/07/17  9:06 PM  Result Value Ref Range   Strep Pneumo Urinary Antigen NEGATIVE NEGATIVE    Comment:        Infection due to S. pneumoniae cannot be absolutely ruled out since the antigen present may be below the detection limit of the test.   Culture, blood (Routine x 2)     Status: None   Collection Time: 07/07/17  9:08 PM  Result Value Ref Range   Specimen Description BLOOD RIGHT  ARM    Special Requests      BOTTLES DRAWN AEROBIC AND ANAEROBIC Blood Culture adequate volume   Culture NO GROWTH 5 DAYS    Report Status 07/12/2017 FINAL   Culture, blood (Routine x 2)     Status: None   Collection Time: 07/07/17  9:15 PM  Result Value Ref Range   Specimen Description BLOOD RIGHT HAND    Special Requests      IN PEDIATRIC BOTTLE Blood Culture results may not be optimal due to an excessive volume of blood received in culture bottles   Culture NO GROWTH 5 DAYS    Report Status 07/12/2017 FINAL   I-Stat CG4 Lactic Acid, ED     Status: None   Collection Time: 07/07/17  9:39 PM  Result Value Ref Range   Lactic Acid, Venous 1.41 0.5 - 1.9 mmol/L  I-Stat CG4 Lactic Acid, ED     Status: None   Collection Time: 07/07/17 11:59 PM  Result Value Ref Range   Lactic Acid, Venous 0.60 0.5 - 1.9 mmol/L  Brain natriuretic peptide     Status: None   Collection Time: 07/08/17 12:51 AM  Result Value Ref Range   B Natriuretic Peptide 71.3 0.0 - 100.0 pg/mL  HIV antibody (Routine Testing)     Status: None   Collection Time: 07/08/17  2:54 AM  Result Value Ref Range   HIV Screen 4th Generation wRfx Non Reactive Non Reactive    Comment: (NOTE) Performed At: [facility], [place] [id number] Lindon Romp MD OI:[id number]   CBC     Status: Abnormal   Collection Time: 07/08/17  2:54 AM  Result Value Ref Range   WBC 11.4 (H) 4.0 - 10.5 K/uL   RBC 4.44 4.22 - 5.81 MIL/uL   Hemoglobin 15.2 13.0 - 17.0 g/dL   HCT 43.7 39.0 - 52.0 %   MCV 98.4 78.0 - 100.0 fL   MCH 34.2 (H) 26.0 - 34.0 pg   MCHC 34.8 30.0 - 36.0 g/dL   RDW 12.4 11.5 - 15.5 %   Platelets 129 (L) 150 - 400 K/uL  Basic metabolic panel     Status: Abnormal   Collection Time: 07/08/17  2:54 AM  Result Value Ref Range   Sodium 132 (L) 135 - 145 mmol/L   Potassium 4.7 3.5 - 5.1 mmol/L   Chloride 99 (L) 101 - 111 mmol/L   CO2 23 22 - 32 mmol/L   Glucose, Bld 162 (H) 65 - 99 mg/dL  BUN 15 6 - 20 mg/dL   Creatinine, Ser 1.06 0.61 - 1.24 mg/dL   Calcium 9.1 8.9 - 10.3 mg/dL   GFR calc non Af Amer >60 >60 mL/min   GFR calc Af Amer >60 >60 mL/min    Comment: (NOTE) The eGFR has been calculated using the CKD EPI equation. This calculation has not been validated in all clinical situations. eGFR's persistently <60 mL/min signify possible Chronic Kidney Disease.    Anion gap 10 5 - 15  Troponin I (q 6hr x 3)     Status: Abnormal   Collection Time: 07/08/17  2:54 AM  Result Value Ref Range   Troponin I 0.05 (HH) <0.03 ng/mL    Comment: CRITICAL VALUE NOTED.  VALUE IS CONSISTENT WITH PREVIOUSLY REPORTED AND CALLED VALUE.  Troponin I (q 6hr x 3)     Status: None   Collection Time: 07/08/17  8:46 AM  Result Value Ref Range   Troponin I <0.03 <0.03 ng/mL  Troponin I (q 6hr x 3)     Status: Abnormal   Collection Time: 07/08/17  2:22 PM  Result Value Ref Range   Troponin I 0.03 (HH) <0.03 ng/mL    Comment: CRITICAL RESULT CALLED TO, READ BACK BY AND VERIFIED WITH: T.GOSS RN @ 1516 07/08/17 BY C.EDENS   CBC     Status: Abnormal   Collection Time: 07/09/17  2:44 AM  Result Value Ref Range   WBC 17.4 (H) 4.0 - 10.5 K/uL   RBC 4.53 4.22 - 5.81 MIL/uL   Hemoglobin 14.8 13.0 - 17.0 g/dL   HCT 43.6 39.0 - 52.0 %   MCV 96.2 78.0 - 100.0 fL   MCH 32.7 26.0 - 34.0 pg   MCHC 33.9 30.0 - 36.0 g/dL   RDW 12.0 11.5 - 15.5 %   Platelets 250 150 - 400 K/uL  Comprehensive metabolic panel     Status: Abnormal   Collection Time: 07/09/17  2:44 AM  Result Value Ref Range   Sodium 135 135 - 145 mmol/L   Potassium 4.9 3.5 - 5.1 mmol/L   Chloride 102 101 - 111 mmol/L   CO2 27 22 - 32 mmol/L   Glucose, Bld 196 (H) 65 - 99 mg/dL   BUN 20 6 - 20 mg/dL   Creatinine, Ser 1.07 0.61 - 1.24 mg/dL   Calcium 9.4 8.9 - 10.3 mg/dL   Total Protein 7.1 6.5 - 8.1 g/dL   Albumin 3.3 (L) 3.5 - 5.0 g/dL   AST 22 15 - 41 U/L   ALT 25 17 - 63 U/L   Alkaline Phosphatase 45 38 - 126 U/L   Total  Bilirubin 0.5 0.3 - 1.2 mg/dL   GFR calc non Af Amer >60 >60 mL/min   GFR calc Af Amer >60 >60 mL/min    Comment: (NOTE) The eGFR has been calculated using the CKD EPI equation. This calculation has not been validated in all clinical situations. eGFR's persistently <60 mL/min signify possible Chronic Kidney Disease.    Anion gap 6 5 - 15  CBC w/Diff     Status: Abnormal   Collection Time: 07/13/17 11:56 AM  Result Value Ref Range   WBC 15.6 Repeated and verified X2. (H) 4.0 - 10.5 K/uL   RBC 4.57 4.22 - 5.81 Mil/uL   Hemoglobin 15.4 13.0 - 17.0 g/dL   HCT 45.5 39.0 - 52.0 %   MCV 99.6 78.0 - 100.0 fl   MCHC 33.8 30.0 - 36.0 g/dL  RDW 12.8 11.5 - 15.5 %   Platelets 242.0 150.0 - 400.0 K/uL   Neutrophils Relative % 75.4 43.0 - 77.0 %   Lymphocytes Relative 14.5 12.0 - 46.0 %   Monocytes Relative 4.5 3.0 - 12.0 %   Eosinophils Relative 5.3 (H) 0.0 - 5.0 %   Basophils Relative 0.3 0.0 - 3.0 %   Neutro Abs 11.7 (H) 1.4 - 7.7 K/uL   Lymphs Abs 2.3 0.7 - 4.0 K/uL   Monocytes Absolute 0.7 0.1 - 1.0 K/uL   Eosinophils Absolute 0.8 (H) 0.0 - 0.7 K/uL   Basophils Absolute 0.0 0.0 - 0.1 K/uL  Basic metabolic panel     Status: Abnormal   Collection Time: 07/13/17 11:56 AM  Result Value Ref Range   Sodium 136 135 - 145 mEq/L   Potassium 4.7 3.5 - 5.1 mEq/L   Chloride 101 96 - 112 mEq/L   CO2 29 19 - 32 mEq/L   Glucose, Bld 114 (H) 70 - 99 mg/dL   BUN 19 6 - 23 mg/dL   Creatinine, Ser 0.98 0.40 - 1.50 mg/dL   Calcium 9.5 8.4 - 10.5 mg/dL   GFR 81.43 >60.00 mL/min    Assessment/Plan: 1. Chronic rhinitis Referral placed to ENT. Will stop Zyrtec and start Xyzal. Will also add on Singulair. Continue Astelin and saline rinses. Return precautions reviewed with patient.  - Ambulatory referral to ENT  2. COPD exacerbation ([place]) Resolving. Complete entire taper of prednisone. Has stopped duonebs. Continue Albuterol as directed. Singulair added.    Leeanne Rio, PA-C

## 2017-08-03 NOTE — Progress Notes (Signed)
Pre visit review using our clinic review tool, if applicable. No additional management support is needed unless otherwise documented below in the visit note. 

## 2017-08-06 ENCOUNTER — Encounter: Payer: Self-pay | Admitting: Cardiology

## 2017-08-06 ENCOUNTER — Ambulatory Visit (INDEPENDENT_AMBULATORY_CARE_PROVIDER_SITE_OTHER): Payer: Medicare HMO | Admitting: Cardiology

## 2017-08-06 VITALS — BP 130/62 | HR 60 | Ht 75.0 in | Wt 320.2 lb

## 2017-08-06 DIAGNOSIS — G250 Essential tremor: Secondary | ICD-10-CM | POA: Diagnosis not present

## 2017-08-06 DIAGNOSIS — G4733 Obstructive sleep apnea (adult) (pediatric): Secondary | ICD-10-CM | POA: Diagnosis not present

## 2017-08-06 DIAGNOSIS — N200 Calculus of kidney: Secondary | ICD-10-CM | POA: Diagnosis not present

## 2017-08-06 DIAGNOSIS — I10 Essential (primary) hypertension: Secondary | ICD-10-CM

## 2017-08-06 DIAGNOSIS — I421 Obstructive hypertrophic cardiomyopathy: Secondary | ICD-10-CM | POA: Diagnosis not present

## 2017-08-06 DIAGNOSIS — E669 Obesity, unspecified: Secondary | ICD-10-CM

## 2017-08-06 NOTE — Progress Notes (Signed)
Cardiology Office Note:    Date:  08/06/2017   ID:  [name], DOB 01/02/1951, MRN [id number]  PCP:  Waldon Merl, PA-C  Cardiologist:  Armanda Magic, MD   Referring MD: Waldon Merl, PA-C   Chief Complaint  Patient presents with  . Sleep Apnea  . Hypertension    History of Present Illness:    Warren Baker is a 66 y.o. male with a hx of  OSA.  He has a history of snoring loudly and stops breathing in his sleep. He underwent PSG showing mild OSA with an AHI of 11.3/hr and is on CPAP therapy.  He is here today for followup and unfortunately says that he had to hand in his CPAP due to his insurance changing.  We ordered him a new CPAP device but he had to have a followup OV first.  He says that he continues to snore but denies awakening gasping for breath.  He feels very tired when he wakes up but does not have time for naps as he works but does find that he gets sleepy.    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  He has an uncle that he SCD and a brother who had a cardiac problem and got an AICD at 65yo.  He was referred to EP but has not been seen. He denies any chest pain or pressure.  He says that he had PNA recently and has had a cough.  He has had some SOB with his PNA but prior to getting sick he denies any SOB or DOE.  He denies any LE edema, dizziness, palptiations or syncope.     Past Medical History:  Diagnosis Date  . Allergic rhinitis   . Arthritis   . Broken leg   . Bronchitis   . Carbon dioxide poisoning   . Colon polyps   . COPD (chronic obstructive pulmonary disease) (HCC)   . Environmental allergies   . Excessive daytime sleepiness 07/13/2016  . GERD (gastroesophageal reflux disease)   . History of chicken pox   . 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 Kidney stones   . Obesity (BMI  30-39.9) 07/13/2016  . OSA (obstructive sleep apnea) 11/01/2016   Mild with AHI 11/hr  . Seasonal allergies   . Snoring 07/13/2016  . Tremors of nervous system     Past Surgical History:  Procedure Laterality Date  . TONSILLECTOMY AND ADENOIDECTOMY    . WISDOM TOOTH EXTRACTION      Current Medications: No outpatient prescriptions have been marked as taking for the 08/06/17 encounter (Office Visit) with Quintella Reichert, MD.     Allergies:   Codeine   Social History   Social History  . Marital status: Single    Spouse name: N/A  . Number of children: 3  . Years of education: N/A   Occupational History  . [employer] Technician    Social History Main Topics  . Smoking status: Former Smoker    Packs/day: 2.00    Years: 30.00    Types: Cigarettes    Quit date: 11/20/2000  . Smokeless tobacco: Never Used  . Alcohol use No  . Drug use: No  . Sexual activity: Not on file   Other Topics Concern  . Not on file   Social History Narrative  .  No narrative on file     Family History: The patient's family history includes Alcoholism in his father; Allergies in his daughter; Arthritis in his father and sister; Breast cancer in his maternal aunt; Cancer in his other; Congestive Heart Failure (age of onset: 64) in his brother; Congestive Heart Failure (age of onset: 64) in his mother; Diabetes in his maternal aunt; Emphysema in his brother; Gallbladder disease in his daughter; Heart attack in his brother; Heart disease in his brother and mother; Kidney Stones in his daughter; Lung cancer in his maternal uncle; Migraines in his daughter; Stroke in his maternal aunt.  ROS:   Please see the history of present illness.     All other systems reviewed and are negative.  EKGs/Labs/Other Studies Reviewed:    The following studies were reviewed today: CPAP downlad  EKG:  EKG is not ordered today.  Recent Labs: 05/31/2017: TSH 1.68 07/08/2017: B Natriuretic Peptide 71.3 07/09/2017: ALT  25 07/13/2017: BUN 19; Creatinine, Ser 0.98; Hemoglobin 15.4; Platelets 242.0; Potassium 4.7; Sodium 136   Recent Lipid Panel    Component Value Date/Time   CHOL 197 05/31/2017 0915   TRIG 97.0 05/31/2017 0915   HDL 38.10 (L) 05/31/2017 0915   CHOLHDL 5 05/31/2017 0915   VLDL 19.4 05/31/2017 0915   LDLCALC 139 (H) 05/31/2017 0915    Physical Exam:    VS:  There were no vitals taken for this visit.    Wt Readings from Last 3 Encounters:  08/03/17 (!) 320 lb (145.2 kg)  07/25/17 (!) 316 lb (143.3 kg)  07/24/17 (!) 312 lb (141.5 kg)     GEN:  Well nourished, well developed in no acute distress HEENT: Normal NECK: No JVD; No carotid bruits LYMPHATICS: No lymphadenopathy CARDIAC: RRR, no murmurs, rubs, gallops RESPIRATORY: few scattered expiratory wheeze ABDOMEN: Soft, non-tender, non-distended MUSCULOSKELETAL:  No edema; No deformity  SKIN: Warm and dry NEUROLOGIC:  Alert and oriented x 3 PSYCHIATRIC:  Normal affect   ASSESSMENT:    1. OSA (obstructive sleep apnea)   2. Essential hypertension   3. HOCM (hypertrophic obstructive cardiomyopathy) (HCC)   4. Obesity (BMI 30-39.9)    PLAN:    In order of problems listed above:  1.   OSA - He had to hand in his CPAP due to his insurance changing.  I have placed a new order with a DME that his insurance covers.  I will see him back once he is back on CPAP.   2.   HTN - BP is well controlled on current meds.  He will continue on Metoprolol  BID.  3.   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  He has a family history of SCD in a brother at 40 who got an AICD.  He has been referred to EP for evaluation for AICD.  4.  Obesity - I have encouraged him to get into a routine exercise program and cut back on carbs and portions.     Medication Adjustments/Labs and Tests Ordered: Current medicines are reviewed at length with the patient today.  Concerns  regarding medicines are outlined above.  No orders of the defined types were placed in this encounter.  No orders of the defined types were placed in this encounter.   Signed, Armanda Magic, MD  08/06/2017 11:17 AM    [facility]

## 2017-08-06 NOTE — Patient Instructions (Signed)
Medication Instructions:  Your physician recommends that you continue on your current medications as directed. Please refer to the Current Medication list given to you today.   Labwork: None Ordered   Testing/Procedures: None Ordered   Follow-Up: Your physician recommends that you schedule a follow-up appointment in: 10 weeks with Dr. Sherlyn Lick have been referred to EP Cardiology for evaluation of your hypertrophic obstructive cardiomyopathy and family hx of sudden cardiac death   If you need a refill on your cardiac medications before your next appointment, please call your pharmacy.   Thank you for choosing [facility]! Eligha Bridegroom, RN [phone]

## 2017-08-09 DIAGNOSIS — J189 Pneumonia, unspecified organism: Secondary | ICD-10-CM | POA: Diagnosis not present

## 2017-08-09 DIAGNOSIS — G4733 Obstructive sleep apnea (adult) (pediatric): Secondary | ICD-10-CM | POA: Diagnosis not present

## 2017-08-09 DIAGNOSIS — J441 Chronic obstructive pulmonary disease with (acute) exacerbation: Secondary | ICD-10-CM | POA: Diagnosis not present

## 2017-08-13 ENCOUNTER — Telehealth: Payer: Self-pay | Admitting: *Deleted

## 2017-08-13 ENCOUNTER — Other Ambulatory Visit: Payer: Self-pay | Admitting: Pharmacist

## 2017-08-13 NOTE — Telephone Encounter (Signed)
Office notes faxed to [facility] to fax #[phone] for patient to receive his CPAP machine.  Patient will be set up for 10 week office visit for insurance compliance.

## 2017-08-13 NOTE — Telephone Encounter (Signed)
-----   Message from Levi Aland, RN sent at 08/06/2017 11:42 AM EDT ----- Coralee North,  Per Dr. Mayford Knife, please follow up with patient's cpap equipment. She saw him in the office today and she plans to see him back in 10 weeks.  Thanks, Marcelino Duster

## 2017-08-13 NOTE — Patient Outreach (Signed)
[facility]) Care Management  08/13/2017  Donshay Lupinski July 05, 1951 [id number]  Patient was referred to [facility] CM Pharmacist by [facility] RN Davina.    Unsuccessful phone outreach to patient.  HIPAA compliant message left requesting return call.   Plan:  If no return call, will make second outreach attempt in the next week.   Tommye Standard, PharmD, [facility] Clinical Pharmacist [facility] [phone]

## 2017-08-14 ENCOUNTER — Other Ambulatory Visit: Payer: Self-pay

## 2017-08-14 ENCOUNTER — Other Ambulatory Visit: Payer: Self-pay | Admitting: Pharmacist

## 2017-08-14 NOTE — Patient Outreach (Signed)
[facility]) Care Management  [facility]   08/14/2017  Warren Baker 09-13-1951 [id number]  Subjective:  Patient was referred to [facility] CM Pharmacist by [facility] RN Davina for medication review.   Successful phone outreach with patient.  HIPAA details verified, and patient agreed to call.   Patient reports he is able to review his medication over the phone.   Patient has a past medical history significant for, but not limited to:  Hypertension, obstructive sleep apnea, tremor.  He follows with neurology regarding treatment for his tremor.    Objective:   Current Medications: Current Outpatient Prescriptions  Medication Sig Dispense Refill  . albuterol (PROVENTIL HFA;VENTOLIN HFA) 108 (90 Base) MCG/ACT inhaler Inhale 2 puffs into the lungs every 6 (six) hours as needed for wheezing or shortness of breath. 1 Inhaler 0  . Ascorbic Acid (VITAMIN C) 1000 MG tablet Take 1,000 mg by mouth daily.    Marland Kitchen aspirin 81 MG tablet Take 81 mg by mouth daily.    Marland Kitchen atorvastatin (LIPITOR) 10 MG tablet TAKE 1 TABLET BY MOUTH EVERY DAY 30 tablet 6  . azelastine (ASTELIN) 0.1 % nasal spray USE 2 SPRAYS IN EACH NOSTRILS TWICE A DAY AS DIRECTED 30 mL 11  . Cetirizine HCl 10 MG CAPS Take 10 mg by mouth daily.    . Cholecalciferol (VITAMIN D-3) 1000 UNITS CAPS Take 1,000 Units by mouth daily.    Marland Kitchen gabapentin (NEURONTIN) 800 MG tablet Take 800 mg by mouth 5 (five) times daily.     Marland Kitchen levocetirizine (XYZAL) 5 MG tablet Take 1 tablet (5 mg total) by mouth every evening. 30 tablet 3  . metoprolol tartrate (LOPRESSOR) 25 MG tablet Take 1 tablet (25 mg total) by mouth 2 (two) times daily. 60 tablet 1  . montelukast (SINGULAIR) 10 MG tablet Take 1 tablet (10 mg total) by mouth at bedtime. 30 tablet 3  . predniSONE (DELTASONE) 10 MG tablet Take 1 tablet QID x 3 days, then 1 tablet TID x 3 days, then 1 tablet BID x 3 days, then 1 tablet daily x 3 days. 30 tablet 0  . primidone (MYSOLINE) 50 MG tablet  Take 150 mg by mouth 2 (two) times daily.    . tamsulosin (FLOMAX) 0.4 MG CAPS capsule TAKE 1 CAPSULE (0.4 MG TOTAL) BY MOUTH AT BEDTIME. 30 capsule 3  . vitamin B-12 (CYANOCOBALAMIN) 1000 MCG tablet Take 1,000 mcg by mouth daily.     No current facility-administered medications for this visit.     Functional Status: In your present state of health, do you have any difficulty performing the following activities: 07/25/2017 07/08/2017  Hearing? N N  Vision? N N  Difficulty concentrating or making decisions? N N  Walking or climbing stairs? N N  Dressing or bathing? N N  Doing errands, shopping? N N  Preparing Food and eating ? N -  Using the Toilet? N -  In the past six months, have you accidently leaked urine? N -  Do you have problems with loss of bowel control? N -  Managing your Medications? N -  Managing your Finances? N -  Housekeeping or managing your Housekeeping? N -  Some recent data might be hidden    Fall/Depression Screening: Fall Risk  07/25/2017 05/31/2017 02/07/2017  Falls in the past year? No No No  Number falls in past yr: - - -  Injury with Fall? - - -  Risk for fall due to : - - -  Risk for fall due to: Comment - - -   PHQ 2/9 Scores 07/25/2017 05/31/2017 02/07/2017 07/31/2015  PHQ - 2 Score 0 0 0 0  PHQ- 9 Score - 0 - -    Assessment:  Medication review per patient report and medication list in this chart:   Drugs sorted by system:  Neurologic/Psychologic: -gabapentin---per scanned notes in chart from neurology 04/2017, patient is taking four times daily, with an extra tab when tremor is worse---he denies ADRs such as dizziness, drowsiness  -primidone  Cardiovascular: -aspirin -atorvastatin  -metoprolol tartrate---on medication list but patient reports he stopped   Pulmonary/Allergy: -albuterol inhaler -azelastine nasal spray  -ipratropium/albuterol nebs---appears he was discharged on this 07/09/17---he reports still using as  needed -levocetirizine -montelukast  Vitamins/Minerals: -vitamin C  -vitamin D  -cyanocobalamin   Miscellaneous: -tamsulosin   Duplications in therapy:  -cetirizine was on medication list---discontinued by PCP per 08/03/17 note---patient reports no longer taking.  It was removed from his medication list.    Other issues noted:  -beta-blocker: Patient reports he stopped metoprolol tartrate due to making his tremor worse.  He reports he resumed propranolol 40 mg, 1 tab in the am and 1/2 tab at lunch.  He reports PCP was aware---this is not on his medication list.     Plan:  Patient denies questions/concerns with his medications at this time.   Patient counseled he can contact [facility] Pharmacist if pharmacy needs arise.   Will route note to PCP to make PCP aware of patient taking propranolol in place of metoprolol tartrate.   Tommye Standard, PharmD, [facility] Clinical Pharmacist [facility] [phone]

## 2017-08-14 NOTE — Patient Outreach (Signed)
[facility]) Care Management  08/14/2017  Warren Baker [birth year] [id number]  Transition of care  Week # 3 Referral date: 07/10/17 Referral source: Transition of care / status post hospital discharge on 07/09/17 from [place] [facility]: Transition of care/ COPD Insurance: [employer] Providers: Marcelline Mates, PA Social support" Daughter, [name]. Patient gave verbal authorization to speak with his daughter regarding all of his personal health information.    Telephone call to patient regarding transition of care follow up. HIPAA verified with patient.  Patient states he is slowly doing better. Patient denies any new onset of symptoms. Patient states he is in the [place] today.  Patient states he has not received his CPAP machine yet. Patient states he had to see his cardiologist first which he did. Patient states his doctor is working on getting his CPAP machine. Patient states his doctor is referring him to an ENT because he is having excessive congestion in his throat and chest.   PLAN; RNCM will follow up with patient within 1 week.    George Ina RN,BSN,CCM [facility] Telephonic  [phone]

## 2017-08-15 DIAGNOSIS — J329 Chronic sinusitis, unspecified: Secondary | ICD-10-CM | POA: Diagnosis not present

## 2017-08-15 DIAGNOSIS — J300 Vasomotor rhinitis: Secondary | ICD-10-CM | POA: Diagnosis not present

## 2017-08-15 DIAGNOSIS — K219 Gastro-esophageal reflux disease without esophagitis: Secondary | ICD-10-CM | POA: Diagnosis not present

## 2017-08-16 ENCOUNTER — Encounter: Payer: Self-pay | Admitting: Pharmacist

## 2017-08-19 NOTE — Progress Notes (Signed)
Electrophysiology Office Note   Date:  08/20/2017   ID:  Warren Baker, DOB 04/02/1951, MRN [id number]  PCP:  Waldon Merl, PA-C  Cardiologist:  Mayford Knife Primary Electrophysiologist:  Yoland Scherr Jorja Loa, MD    Chief Complaint  Patient presents with  . New Patient (Initial Visit)    HOCM     History of Present Illness: Warren Baker is a 66 y.o. male who is being seen today for the evaluation of HOCM at the request of [place], [place]. Presenting today for electrophysiology evaluation. He has history of sleep apnea on CPAP, COPD, and possible apical hypertrophic cardiomyopathy. He was referred for cardiac MRI but was too big for the MRI scanner. He wore a Holter monitor that showed PVCs but no nonsustained ventricular tachycardia. His brother, at age 66, had myocardial infarction and had an ICD placed. His father died of dementia, and multiple family members have died of cancer and COPD. He has not ever had syncope.    Today, he denies symptoms of palpitations, chest pain, shortness of breath, orthopnea, PND, lower extremity edema, claudication, dizziness, presyncope, syncope, bleeding, or neurologic sequela. The patient is tolerating medications without difficulties.    Past Medical History:  Diagnosis Date  . Allergic rhinitis   . Arthritis   . Broken leg   . Bronchitis   . Carbon dioxide poisoning   . Colon polyps   . COPD (chronic obstructive pulmonary disease) (HCC)   . Environmental allergies   . Excessive daytime sleepiness 07/13/2016  . GERD (gastroesophageal reflux disease)   . History of chicken pox   . 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 Kidney stones   . Obesity (BMI 30-39.9) 07/13/2016  . OSA (obstructive sleep apnea) 11/01/2016   Mild with AHI 11/hr  . Seasonal allergies   . Snoring 07/13/2016  . Tremors of nervous system    Past Surgical History:  Procedure  Laterality Date  . TONSILLECTOMY AND ADENOIDECTOMY    . WISDOM TOOTH EXTRACTION       Current Outpatient Prescriptions  Medication Sig Dispense Refill  . albuterol (PROVENTIL HFA;VENTOLIN HFA) 108 (90 Base) MCG/ACT inhaler Inhale 2 puffs into the lungs every 6 (six) hours as needed for wheezing or shortness of breath. 1 Inhaler 0  . Ascorbic Acid (VITAMIN C) 1000 MG tablet Take 1,000 mg by mouth daily.    Marland Kitchen aspirin 81 MG tablet Take 81 mg by mouth daily.    Marland Kitchen atorvastatin (LIPITOR) 10 MG tablet TAKE 1 TABLET BY MOUTH EVERY DAY 30 tablet 6  . azelastine (ASTELIN) 0.1 % nasal spray USE 2 SPRAYS IN EACH NOSTRILS TWICE A DAY AS DIRECTED 30 mL 11  . Cholecalciferol (VITAMIN D-3) 1000 UNITS CAPS Take 1,000 Units by mouth daily.    Marland Kitchen gabapentin (NEURONTIN) 800 MG tablet Take 800 mg by mouth 5 (five) times daily.     Marland Kitchen levocetirizine (XYZAL) 5 MG tablet Take 1 tablet (5 mg total) by mouth every evening. 30 tablet 3  . metoprolol tartrate (LOPRESSOR) 25 MG tablet Take 1 tablet (25 mg total) by mouth 2 (two) times daily. 60 tablet 1  . montelukast (SINGULAIR) 10 MG tablet Take 1 tablet (10 mg total) by mouth at bedtime. 30 tablet 3  . primidone (MYSOLINE) 50 MG tablet Take 150 mg by mouth 2 (two) times daily.    . tamsulosin (FLOMAX) 0.4 MG CAPS capsule TAKE 1 CAPSULE (0.4  MG TOTAL) BY MOUTH AT BEDTIME. 30 capsule 3  . vitamin B-12 (CYANOCOBALAMIN) 1000 MCG tablet Take 1,000 mcg by mouth daily.     No current facility-administered medications for this visit.     Allergies:   Codeine and Propoxyphene   Social History:  The patient  reports that he quit smoking about 16 years ago. His smoking use included Cigarettes. He has a 60.00 pack-year smoking history. He has never used smokeless tobacco. He reports that he does not drink alcohol or use drugs.   Family History:  The patient's family history includes Alcoholism in his father; Allergies in his daughter; Arthritis in his father and sister; Breast  cancer in his maternal aunt; Cancer in his other; Congestive Heart Failure (age of onset: 92) in his brother; Congestive Heart Failure (age of onset: 67) in his mother; Diabetes in his maternal aunt; Emphysema in his brother; Gallbladder disease in his daughter; Heart attack in his brother; Heart disease in his brother and mother; Kidney Stones in his daughter; Lung cancer in his maternal uncle; Migraines in his daughter; Stroke in his maternal aunt.    ROS:  Please see the history of present illness.   Otherwise, review of systems is positive for none.   All other systems are reviewed and negative.    PHYSICAL EXAM: VS:  BP 118/70   Pulse (!) 58   Ht  (1.905 m)   Wt (!) 321 lb (145.6 kg)   SpO2 92%   BMI 40.12 kg/m  , BMI Body mass index is 40.12 kg/m. GEN: Well nourished, well developed, in no acute distress  HEENT: normal  Neck: no JVD, carotid bruits, or masses Cardiac: RRR; no murmurs, rubs, or gallops,no edema  Respiratory:  clear to auscultation bilaterally, normal work of breathing GI: soft, nontender, nondistended, + BS MS: no deformity or atrophy  Skin: warm and dry Neuro:  Strength and sensation are intact Psych: euthymic mood, full affect  EKG:  EKG is not ordered today. Personal review of the ekg ordered 07/08/17 shows sinus rhythm, LVH with repol abnormalities  Recent Labs: 05/31/2017: TSH 1.68 07/08/2017: B Natriuretic Peptide 71.3 07/09/2017: ALT 25 07/13/2017: BUN 19; Creatinine, Ser 0.98; Hemoglobin 15.4; Platelets 242.0; Potassium 4.7; Sodium 136    Lipid Panel     Component Value Date/Time   CHOL 197 05/31/2017 0915   TRIG 97.0 05/31/2017 0915   HDL 38.10 (L) 05/31/2017 0915   CHOLHDL 5 05/31/2017 0915   VLDL 19.4 05/31/2017 0915   LDLCALC 139 (H) 05/31/2017 0915     Wt Readings from Last 3 Encounters:  08/20/17 (!) 321 lb (145.6 kg)  08/06/17 (!) 320 lb 3.2 oz (145.2 kg)  08/03/17 (!) 320 lb (145.2 kg)      Other studies  Reviewed: Additional studies/ records that were reviewed today include: TTE 07/27/16  Review of the above records today demonstrates:  - Left ventricle: The cavity size was normal. Suspect apical   hypertrophic cardiomyopathy. Systolic function was normal. The   estimated ejection fraction was in the range of 60% to 65%. Wall   motion was normal; there were no regional wall motion   abnormalities. Doppler parameters are consistent with abnormal   left ventricular relaxation (grade 1 diastolic dysfunction). - Aortic valve: Poorly visualized. There was no stenosis. - Mitral valve: There was no significant regurgitation. - Right ventricle: The cavity size was normal. Systolic function   was normal. - Pulmonary arteries: No complete TR doppler jet so unable  to   estimate PA systolic pressure. - Systemic veins: IVC measured 2.4 cm with normal respirophasic   variation, suggesting RA pressure 8 mmHg.  Impressions:  - Normal LV size with EF 60-65%. Pattern of wall thickening   concerning for apical hypertrophic cardiomyopathy. Normal RV size   and systolic function. No significant valvular abnormalities.  Myoview 07/28/16  Nuclear stress EF: 52%.  The study is normal.  This is a low risk study.   Low risk stress nuclear study with normal perfusion and low normal left ventricular systolic function.  Holter 07/28/16 - personally reviewed  Sinus Bradycardia and NSR with average heart rate 51bpm and ranged from 36bpm to 93bpm.  Frequent PVCs, trigeminal PVCs,  Nonsustained atrial tachycardia up to 5 beats in a row, PACs, atrial couplets and triplets  ASSESSMENT AND PLAN:  1.  Hypertrophic obstructive cardiomyopathy, apical variant: Currently, he does not have risk factors that would put him at high risk for sudden death. He has not had syncope, does not have a family history, nor does he have nonsustained VT on his Holter monitor. His Myoview is also low risk. Would hold off on  defibrillator at this time.  2. Hypertension: Would pressure well controlled. No changes at this time.  3. Obstructive sleep apnea: Working to get back on CPAP.    Current medicines are reviewed at length with the patient today.   The patient does not have concerns regarding his medicines.  The following changes were made today:  none  Labs/ tests ordered today include:  No orders of the defined types were placed in this encounter.    Disposition:   FU with Freddye Cardamone PRN  Signed, Yissel Habermehl Jorja Loa, MD  08/20/2017 2:05 PM     [facility] [address] [place] [address] [phone] (office) [phone] (fax)

## 2017-08-20 ENCOUNTER — Other Ambulatory Visit: Payer: Self-pay | Admitting: Emergency Medicine

## 2017-08-20 ENCOUNTER — Encounter: Payer: Self-pay | Admitting: Cardiology

## 2017-08-20 ENCOUNTER — Ambulatory Visit (INDEPENDENT_AMBULATORY_CARE_PROVIDER_SITE_OTHER): Payer: Medicare HMO | Admitting: Cardiology

## 2017-08-20 VITALS — BP 118/70 | HR 58 | Ht 75.0 in | Wt 321.0 lb

## 2017-08-20 DIAGNOSIS — I421 Obstructive hypertrophic cardiomyopathy: Secondary | ICD-10-CM | POA: Diagnosis not present

## 2017-08-20 DIAGNOSIS — I10 Essential (primary) hypertension: Secondary | ICD-10-CM

## 2017-08-20 DIAGNOSIS — G4733 Obstructive sleep apnea (adult) (pediatric): Secondary | ICD-10-CM | POA: Diagnosis not present

## 2017-08-20 DIAGNOSIS — E785 Hyperlipidemia, unspecified: Secondary | ICD-10-CM

## 2017-08-20 MED ORDER — ATORVASTATIN CALCIUM 10 MG PO TABS: 10.0000 mg | ORAL_TABLET | Freq: Every day | ORAL | 1 refills | Status: DC

## 2017-08-20 NOTE — Patient Instructions (Signed)
Medication Instructions:  Your physician recommends that you continue on your current medications as directed. Please refer to the Current Medication list given to you today.  * If you need a refill on your cardiac medications before your next appointment, please call your pharmacy.   Labwork: None ordered  Testing/Procedures: None ordered  Follow-Up: No follow up is needed at this time with Dr. Camnitz.  He will see you on an as needed basis.   Thank you for choosing [facility]!!   Daesha Insco, RN [phone]     

## 2017-08-21 ENCOUNTER — Other Ambulatory Visit: Payer: Self-pay

## 2017-08-21 NOTE — Patient Outreach (Signed)
[facility]) Care Management  08/21/2017  Delmont Prosch 1951-10-13 [id number]  Transition of care  Week # 4 Referral date: 07/10/17 Referral source: Transition of care / status post hospital discharge on 07/09/17 from [place] [facility]: Transition of care/ COPD Insurance: [employer] Providers: Marcelline Mates, PA Social support" Daughter, [name]. Patient gave verbal authorization to speak with his daughter regarding all of his personal health information.  Attempt #1  Telephone call to patient regarding transition of care follow up. Unable to reach patient. HIPAA compliant voice message left with call back phone number.   PLAN; RNCM will attempt 2nd telephone outreach to patient within 1 week.   George Ina RN,BSN,CCM [facility] Telephonic  [phone]

## 2017-08-22 ENCOUNTER — Ambulatory Visit: Payer: Self-pay

## 2017-08-27 ENCOUNTER — Other Ambulatory Visit: Payer: Self-pay

## 2017-08-27 NOTE — Patient Outreach (Signed)
[facility]) Care Management  08/27/2017  Keilon Ressel [birth year] [id number]   Telephone assessment: Program; COPD  SUBJECTIVE; Call to patient for telephone assessment. HIPAA verified. Patient states he is doing ok.  Patient states he had to post pone getting his CPAP equipment until next week. Patient states he has a follow up appointment with the ENT specialist in 2 months. Patient states he saw the ENT specialist and she started him on a nasal spray 2 times per day. Patient states the doctor seems to think his symptoms are acid reflux related. Patient states he does not have acid reflux. RNCM explained to patient relationship of post nasal drip/ congestion symptoms and reflux. RNCM advised patient to use his nasal spray as directed and keep follow up appointment with specialist. Patient verbalized understanding.  Patient reports he is in the Aldrin Engelhard zone today with his COPD. Patient states he is using his nebulizer 1 time per day which seems to help him. Patient reports he is able to do his activities without much shortness of breath.  RNCM reviewed COPD action plan yellow zone symptoms with patient.   RNCM offered additional EMMI educational material to patient on COPD. Patient refused at this time. Patient denies having any additional concerns at this time. Patient verbally agreed to next outreach with RNCM.   PLAN; RNCM will attempt 2nd telephone call to patient within the month of November.   George Ina RN,BSN,CCM [facility] Telephonic  [phone]

## 2017-09-03 ENCOUNTER — Other Ambulatory Visit: Payer: Self-pay | Admitting: Emergency Medicine

## 2017-09-03 DIAGNOSIS — R251 Tremor, unspecified: Secondary | ICD-10-CM

## 2017-09-03 MED ORDER — METOPROLOL TARTRATE 25 MG PO TABS: 25.0000 mg | ORAL_TABLET | Freq: Two times a day (BID) | ORAL | 5 refills | Status: DC

## 2017-09-03 MED ORDER — PROPRANOLOL HCL 40 MG PO TABS: 40.0000 mg | ORAL_TABLET | Freq: Every day | ORAL | 0 refills | Status: DC

## 2017-09-05 DIAGNOSIS — G4733 Obstructive sleep apnea (adult) (pediatric): Secondary | ICD-10-CM | POA: Diagnosis not present

## 2017-09-08 DIAGNOSIS — G4733 Obstructive sleep apnea (adult) (pediatric): Secondary | ICD-10-CM | POA: Diagnosis not present

## 2017-09-08 DIAGNOSIS — J441 Chronic obstructive pulmonary disease with (acute) exacerbation: Secondary | ICD-10-CM | POA: Diagnosis not present

## 2017-09-08 DIAGNOSIS — J189 Pneumonia, unspecified organism: Secondary | ICD-10-CM | POA: Diagnosis not present

## 2017-09-25 ENCOUNTER — Encounter: Payer: Self-pay | Admitting: Cardiology

## 2017-09-26 ENCOUNTER — Telehealth: Payer: Self-pay | Admitting: Physician Assistant

## 2017-09-26 NOTE — Telephone Encounter (Signed)
Patient states he was told by pcp to hold off on getting pneumonia vaccine.  He wants to know if pcp feels is healthy enough now to get flu and pneumonia vaccine.

## 2017-09-26 NOTE — Telephone Encounter (Signed)
If no respiratory symptoms/cold symptoms -- ok to schedule patient for flu shot.

## 2017-09-26 NOTE — Telephone Encounter (Signed)
Spoke with patient and he is currently battling a productive cough, congestion/cold symptoms.  He wanted to be seen because he states he has been fighting this "mess" for a few weeks.  I told him we will hold off of flu/prevnar until he is seen.  He is coming in on Friday at 2:30 to see PCP

## 2017-09-26 NOTE — Telephone Encounter (Signed)
Is he okay for pneumonia too or does he still need to hold off on this?

## 2017-09-26 NOTE — Telephone Encounter (Signed)
My apologies. He can get the flu shot and Prevnar that he is due for.

## 2017-09-27 ENCOUNTER — Other Ambulatory Visit: Payer: Self-pay

## 2017-09-27 NOTE — Patient Outreach (Signed)
[facility]) Care Management  09/27/2017  Earnestine Tuohey October 09, 1951 [id number]  Case closure: Telephone call to patient for assessment follow up. HIPAA verified. Patient states he has sinus / head cold today. Patient states he has stuffy nose, head and chest congestion. Patient denies fever. Patient states he has an appointment tomorrow 09/28/17 to see his doctor for his sinus issues.  Patient reports he continues on his nasal spray prescribed by the ENT doctor. Patient reports he is scheduled for a follow up visit.   Patient reports he is between the Leng Montesdeoca and yellow zone of the heart failure action plan today due to his sinus/head cold. Patient reports he continues to take the rest of his medications as prescribed.  Patient denies any further needs at this time.  RNCM advised patient to call his doctor for non emergent symptoms/ concerns and call 911 for emergency symptoms. Patient verbalized understanding.  RNCM advised patient to contact [facility] care management if he had future needs.  Patient verbally agreed to closing out to [facility] care management services.   ASSESSMENT: Goal met to [facility] care management services.   PLAN:  RNCM will refer patient to care management assistant to close to services.  RNCM will send patient closure letter [facility] will send patients primary MD closure letter.   Quinn Plowman RN,BSN,CCM [facility] Telephonic  [phone]

## 2017-09-28 ENCOUNTER — Encounter: Payer: Self-pay | Admitting: Physician Assistant

## 2017-09-28 ENCOUNTER — Other Ambulatory Visit: Payer: Self-pay

## 2017-09-28 ENCOUNTER — Ambulatory Visit: Payer: Medicare HMO | Admitting: Physician Assistant

## 2017-09-28 ENCOUNTER — Encounter: Payer: Self-pay | Admitting: Emergency Medicine

## 2017-09-28 VITALS — BP 140/70 | HR 52 | Temp 98.2°F | Resp 14 | Ht 75.0 in | Wt 326.0 lb

## 2017-09-28 DIAGNOSIS — Z23 Encounter for immunization: Secondary | ICD-10-CM

## 2017-09-28 DIAGNOSIS — J410 Simple chronic bronchitis: Secondary | ICD-10-CM

## 2017-09-28 DIAGNOSIS — J310 Chronic rhinitis: Secondary | ICD-10-CM

## 2017-09-28 DIAGNOSIS — J329 Chronic sinusitis, unspecified: Secondary | ICD-10-CM | POA: Diagnosis not present

## 2017-09-28 DIAGNOSIS — K219 Gastro-esophageal reflux disease without esophagitis: Secondary | ICD-10-CM

## 2017-09-28 MED ORDER — FLUTICASONE PROPIONATE HFA 110 MCG/ACT IN AERO: 1.0000 | INHALATION_SPRAY | Freq: Two times a day (BID) | RESPIRATORY_TRACT | 12 refills | Status: DC

## 2017-09-28 MED ORDER — OMEPRAZOLE 20 MG PO CPDR: 20.0000 mg | DELAYED_RELEASE_CAPSULE | Freq: Every day | ORAL | 3 refills | Status: DC

## 2017-09-28 NOTE — Telephone Encounter (Signed)
Patient set up on 09/06/2017.

## 2017-09-28 NOTE — Patient Instructions (Signed)
Stop the Ipratropium nasal spray as it has not helped. Can stop the Astelin as well. Continue Flonase, Zyzal and Singulair.  Start the Flovent inhaler as directed. Start Omeprazole once daily for 2 weeks. I am ordering a CT of your sinuses to further assess.   We will proceed with the pneumonia shot today. Will defer flu shot until we get further assessment.   Follow-up with me in 2 weeks.

## 2017-09-28 NOTE — Progress Notes (Signed)
Patient presents to clinic today c/o continued rhinitis and dry cough despite treatment with multiple medications. Patient has most recently been evaluated by ENT who added on intranasal ipratropium to regimen. He is using this in addition to [place] and singulair. Patient endorses drainage is thin and clear, accompanied by intermittent sinus pressure. Denies sinus pain, fever, chills. Cough remains dry. Was told by ENT that he would need imaging of sinuses and potential treatment for silent GERD if symptoms were not improving. Denies fever, chills, nausea/vomiting or change to bowel or bladder habits.   Past Medical History:  Diagnosis Date  . Allergic rhinitis   . Arthritis   . Broken leg   . Bronchitis   . Carbon dioxide poisoning   . Colon polyps   . COPD (chronic obstructive pulmonary disease) ([place])   . Environmental allergies   . Excessive daytime sleepiness 07/13/2016  . GERD (gastroesophageal reflux disease)   . History of chicken pox   . 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 Kidney stones   . Obesity (BMI 30-39.9) 07/13/2016  . OSA (obstructive sleep apnea) 11/01/2016   Mild with AHI 11/hr  . Seasonal allergies   . Snoring 07/13/2016  . Tremors of nervous system     Current Outpatient Medications on File Prior to Visit  Medication Sig Dispense Refill  . albuterol (PROVENTIL HFA;VENTOLIN HFA) 108 (90 Base) MCG/ACT inhaler Inhale 2 puffs into the lungs every 6 (six) hours as needed for wheezing or shortness of breath. 1 Inhaler 0  . Ascorbic Acid (VITAMIN C) 1000 MG tablet Take 1,000 mg by mouth daily.    Marland Kitchen aspirin 81 MG tablet Take 81 mg by mouth daily.    Marland Kitchen atorvastatin (LIPITOR) 10 MG tablet Take 1 tablet (10 mg total) by mouth daily. 90 tablet 1  . azelastine (ASTELIN) 0.1 % nasal spray USE 2 SPRAYS IN EACH NOSTRILS TWICE A DAY AS DIRECTED 30 mL 11  . Cholecalciferol (VITAMIN D-3) 1000 UNITS  CAPS Take 1,000 Units by mouth daily.    Marland Kitchen gabapentin (NEURONTIN) 800 MG tablet Take 800 mg by mouth 5 (five) times daily.     Marland Kitchen levocetirizine (XYZAL) 5 MG tablet Take 1 tablet (5 mg total) by mouth every evening. 30 tablet 3  . montelukast (SINGULAIR) 10 MG tablet Take 1 tablet (10 mg total) by mouth at bedtime. 30 tablet 3  . primidone (MYSOLINE) 50 MG tablet Take 150 mg by mouth 2 (two) times daily.    . propranolol (INDERAL) 40 MG tablet Take 1 tablet (40 mg total) by mouth daily. 30 tablet 0  . tamsulosin (FLOMAX) 0.4 MG CAPS capsule TAKE 1 CAPSULE (0.4 MG TOTAL) BY MOUTH AT BEDTIME. 30 capsule 3  . vitamin B-12 (CYANOCOBALAMIN) 1000 MCG tablet Take 1,000 mcg by mouth daily.     No current facility-administered medications on file prior to visit.     Allergies  Allergen Reactions  . Codeine Nausea And Vomiting    All Codeine Related Drugs   . Propoxyphene Nausea And Vomiting    Family History  Problem Relation Age of Onset  . Heart disease Mother   . Congestive Heart Failure Mother 98       Deceased  . Alcoholism Father        Living  . Arthritis Father   . Diabetes Maternal Aunt   . Cancer Other  PGGM  . Breast cancer Maternal Aunt   . Lung cancer Maternal Uncle   . Heart disease Brother   . Heart attack Brother   . Congestive Heart Failure Brother 22       Deceased  . Stroke Maternal Aunt   . Emphysema Brother        #2  . Arthritis Sister        #1  . Allergies Daughter   . Kidney Stones Daughter   . Gallbladder disease Daughter   . Migraines Daughter     Social History   Socioeconomic History  . Marital status: Single    Spouse name: None  . Number of children: 3  . Years of education: None  . Highest education level: None  Social Needs  . Financial resource strain: None  . Food insecurity - worry: None  . Food insecurity - inability: None  . Transportation needs - medical: None  . Transportation needs - non-medical: None  Occupational  History  . Occupation: Health visitor  Tobacco Use  . Smoking status: Former Smoker    Packs/day: 2.00    Years: 30.00    Pack years: 60.00    Types: Cigarettes    Last attempt to quit: 11/20/2000    Years since quitting: 16.8  . Smokeless tobacco: Never Used  Substance and Sexual Activity  . Alcohol use: No    Alcohol/week: 0.0 oz  . Drug use: No  . Sexual activity: None  Other Topics Concern  . None  Social History Narrative  . None   Review of Systems - See HPI.  All other ROS are negative.  BP 140/70   Pulse (!) 52   Temp 98.2 F (36.8 C) (Oral)   Resp 14   Ht _0  (1.905 m)   Wt (!) 326 lb (147.9 kg)   SpO2 97%   BMI 40.75 kg/m   Physical Exam  Constitutional: He is well-developed, well-nourished, and in no distress.  HENT:  Head: Normocephalic and atraumatic.  Right Ear: Tympanic membrane normal.  Left Ear: Tympanic membrane normal.  Nose: Mucosal edema and rhinorrhea present. Right sinus exhibits no maxillary sinus tenderness and no frontal sinus tenderness. Left sinus exhibits no maxillary sinus tenderness and no frontal sinus tenderness.  Mouth/Throat: Uvula is midline.  Eyes: Conjunctivae are normal.  Neck: Neck supple.  Cardiovascular: Normal rate, regular rhythm, normal heart sounds and intact distal pulses.  Pulmonary/Chest: Effort normal and breath sounds normal. No respiratory distress. He has no wheezes. He has no rales. He exhibits no tenderness.  Abdominal: Soft. Bowel sounds are normal. He exhibits no distension. There is no tenderness.  Lymphadenopathy:    He has no cervical adenopathy.  Vitals reviewed.   Recent Results (from the past 2160 hour(s))  Comprehensive metabolic panel     Status: Abnormal   Collection Time: 07/07/17  9:04 PM  Result Value Ref Range   Sodium 136 135 - 145 mmol/L   Potassium 4.4 3.5 - 5.1 mmol/L   Chloride 99 (L) 101 - 111 mmol/L   CO2 28 22 - 32 mmol/L   Glucose, Bld 113 (H) 65 - 99 mg/dL   BUN 15 6 - 20 mg/dL     Creatinine, Ser 1.07 0.61 - 1.24 mg/dL   Calcium 9.4 8.9 - 10.3 mg/dL   Total Protein 7.6 6.5 - 8.1 g/dL   Albumin 3.8 3.5 - 5.0 g/dL   AST 28 15 - 41 U/L   ALT 30 17 -  63 U/L   Alkaline Phosphatase 48 38 - 126 U/L   Total Bilirubin 0.5 0.3 - 1.2 mg/dL   GFR calc non Af Amer >60 >60 mL/min   GFR calc Af Amer >60 >60 mL/min    Comment: (NOTE) The eGFR has been calculated using the CKD EPI equation. This calculation has not been validated in all clinical situations. eGFR's persistently <60 mL/min signify possible Chronic Kidney Disease.    Anion gap 9 5 - 15  CBC with Differential     Status: Abnormal   Collection Time: 07/07/17  9:04 PM  Result Value Ref Range   WBC 16.5 (H) 4.0 - 10.5 K/uL   RBC 4.46 4.22 - 5.81 MIL/uL   Hemoglobin 14.9 13.0 - 17.0 g/dL   HCT 43.7 39.0 - 52.0 %   MCV 98.0 78.0 - 100.0 fL   MCH 33.4 26.0 - 34.0 pg   MCHC 34.1 30.0 - 36.0 g/dL   RDW 12.1 11.5 - 15.5 %   Platelets 237 150 - 400 K/uL   Neutrophils Relative % 50 %   Neutro Abs 8.2 (H) 1.7 - 7.7 K/uL   Lymphocytes Relative 20 %   Lymphs Abs 3.4 0.7 - 4.0 K/uL   Monocytes Relative 8 %   Monocytes Absolute 1.4 (H) 0.1 - 1.0 K/uL   Eosinophils Relative 21 %   Eosinophils Absolute 3.5 (H) 0.0 - 0.7 K/uL   Basophils Relative 1 %   Basophils Absolute 0.1 0.0 - 0.1 K/uL  Protime-INR     Status: None   Collection Time: 07/07/17  9:04 PM  Result Value Ref Range   Prothrombin Time 15.0 11.4 - 15.2 seconds   INR 1.18   Troponin I     Status: Abnormal   Collection Time: 07/07/17  9:04 PM  Result Value Ref Range   Troponin I 0.06 (HH) <0.03 ng/mL    Comment: CRITICAL RESULT CALLED TO, READ BACK BY AND VERIFIED WITH: C.CHRISCOE,RN [id number] 07/07/17 M.CAMPBELL   Urinalysis, Routine w reflex microscopic     Status: None   Collection Time: 07/07/17  9:06 PM  Result Value Ref Range   Color, Urine YELLOW YELLOW   APPearance CLEAR CLEAR   Specific Gravity, Urine 1.021 1.005 - 1.030   pH 7.0 5.0 - 8.0    Glucose, UA NEGATIVE NEGATIVE mg/dL   Hgb urine dipstick NEGATIVE NEGATIVE   Bilirubin Urine NEGATIVE NEGATIVE   Ketones, ur NEGATIVE NEGATIVE mg/dL   Protein, ur NEGATIVE NEGATIVE mg/dL   Nitrite NEGATIVE NEGATIVE   Leukocytes, UA NEGATIVE NEGATIVE  Legionella Pneumophila Serogp 1 Ur Ag     Status: None   Collection Time: 07/07/17  9:06 PM  Result Value Ref Range   L. pneumophila Serogp 1 Ur Ag Negative Negative    Comment: (NOTE) Presumptive negative for L. pneumophila serogroup 1 antigen in urine, suggesting no recent or current infection. Legionnaires' disease cannot be ruled out since other serogroups and species may also cause disease. Performed At: [facility] [address] [place], [place] [id number] Lindon Romp MD JT:[id number]   Strep pneumoniae urinary antigen     Status: None   Collection Time: 07/07/17  9:06 PM  Result Value Ref Range   Strep Pneumo Urinary Antigen NEGATIVE NEGATIVE    Comment:        Infection due to S. pneumoniae cannot be absolutely ruled out since the antigen present may be below the detection limit of the test.   Culture, blood (Routine x  2)     Status: None   Collection Time: 07/07/17  9:08 PM  Result Value Ref Range   Specimen Description BLOOD RIGHT ARM    Special Requests      BOTTLES DRAWN AEROBIC AND ANAEROBIC Blood Culture adequate volume   Culture NO GROWTH 5 DAYS    Report Status 07/12/2017 FINAL   Culture, blood (Routine x 2)     Status: None   Collection Time: 07/07/17  9:15 PM  Result Value Ref Range   Specimen Description BLOOD RIGHT HAND    Special Requests      IN PEDIATRIC BOTTLE Blood Culture results may not be optimal due to an excessive volume of blood received in culture bottles   Culture NO GROWTH 5 DAYS    Report Status 07/12/2017 FINAL   I-Stat CG4 Lactic Acid, ED     Status: None   Collection Time: 07/07/17  9:39 PM  Result Value Ref Range   Lactic Acid, Venous 1.41 0.5 - 1.9 mmol/L  I-Stat  CG4 Lactic Acid, ED     Status: None   Collection Time: 07/07/17 11:59 PM  Result Value Ref Range   Lactic Acid, Venous 0.60 0.5 - 1.9 mmol/L  Brain natriuretic peptide     Status: None   Collection Time: 07/08/17 12:51 AM  Result Value Ref Range   B Natriuretic Peptide 71.3 0.0 - 100.0 pg/mL  HIV antibody (Routine Testing)     Status: None   Collection Time: 07/08/17  2:54 AM  Result Value Ref Range   HIV Screen 4th Generation wRfx Non Reactive Non Reactive    Comment: (NOTE) Performed At: [facility] [place], [place] [id number] Lindon Romp MD [id number]   CBC     Status: Abnormal   Collection Time: 07/08/17  2:54 AM  Result Value Ref Range   WBC 11.4 (H) 4.0 - 10.5 K/uL   RBC 4.44 4.22 - 5.81 MIL/uL   Hemoglobin 15.2 13.0 - 17.0 g/dL   HCT 43.7 39.0 - 52.0 %   MCV 98.4 78.0 - 100.0 fL   MCH 34.2 (H) 26.0 - 34.0 pg   MCHC 34.8 30.0 - 36.0 g/dL   RDW 12.4 11.5 - 15.5 %   Platelets 129 (L) 150 - 400 K/uL  Basic metabolic panel     Status: Abnormal   Collection Time: 07/08/17  2:54 AM  Result Value Ref Range   Sodium 132 (L) 135 - 145 mmol/L   Potassium 4.7 3.5 - 5.1 mmol/L   Chloride 99 (L) 101 - 111 mmol/L   CO2 23 22 - 32 mmol/L   Glucose, Bld 162 (H) 65 - 99 mg/dL   BUN 15 6 - 20 mg/dL   Creatinine, Ser 1.06 0.61 - 1.24 mg/dL   Calcium 9.1 8.9 - 10.3 mg/dL   GFR calc non Af Amer >60 >60 mL/min   GFR calc Af Amer >60 >60 mL/min    Comment: (NOTE) The eGFR has been calculated using the CKD EPI equation. This calculation has not been validated in all clinical situations. eGFR's persistently <60 mL/min signify possible Chronic Kidney Disease.    Anion gap 10 5 - 15  Troponin I (q 6hr x 3)     Status: Abnormal   Collection Time: 07/08/17  2:54 AM  Result Value Ref Range   Troponin I 0.05 (HH) <0.03 ng/mL    Comment: CRITICAL VALUE NOTED.  VALUE IS CONSISTENT WITH PREVIOUSLY REPORTED AND CALLED VALUE.  Troponin I (q 6hr x 3)      Status: None   Collection Time: 07/08/17  8:46 AM  Result Value Ref Range   Troponin I <0.03 <0.03 ng/mL  Troponin I (q 6hr x 3)     Status: Abnormal   Collection Time: 07/08/17  2:22 PM  Result Value Ref Range   Troponin I 0.03 (HH) <0.03 ng/mL    Comment: CRITICAL RESULT CALLED TO, READ BACK BY AND VERIFIED WITH: T.GOSS RN @ 1516 07/08/17 BY C.EDENS   CBC     Status: Abnormal   Collection Time: 07/09/17  2:44 AM  Result Value Ref Range   WBC 17.4 (H) 4.0 - 10.5 K/uL   RBC 4.53 4.22 - 5.81 MIL/uL   Hemoglobin 14.8 13.0 - 17.0 g/dL   HCT 43.6 39.0 - 52.0 %   MCV 96.2 78.0 - 100.0 fL   MCH 32.7 26.0 - 34.0 pg   MCHC 33.9 30.0 - 36.0 g/dL   RDW 12.0 11.5 - 15.5 %   Platelets 250 150 - 400 K/uL  Comprehensive metabolic panel     Status: Abnormal   Collection Time: 07/09/17  2:44 AM  Result Value Ref Range   Sodium 135 135 - 145 mmol/L   Potassium 4.9 3.5 - 5.1 mmol/L   Chloride 102 101 - 111 mmol/L   CO2 27 22 - 32 mmol/L   Glucose, Bld 196 (H) 65 - 99 mg/dL   BUN 20 6 - 20 mg/dL   Creatinine, Ser 1.07 0.61 - 1.24 mg/dL   Calcium 9.4 8.9 - 10.3 mg/dL   Total Protein 7.1 6.5 - 8.1 g/dL   Albumin 3.3 (L) 3.5 - 5.0 g/dL   AST 22 15 - 41 U/L   ALT 25 17 - 63 U/L   Alkaline Phosphatase 45 38 - 126 U/L   Total Bilirubin 0.5 0.3 - 1.2 mg/dL   GFR calc non Af Amer >60 >60 mL/min   GFR calc Af Amer >60 >60 mL/min    Comment: (NOTE) The eGFR has been calculated using the CKD EPI equation. This calculation has not been validated in all clinical situations. eGFR's persistently <60 mL/min signify possible Chronic Kidney Disease.    Anion gap 6 5 - 15  CBC w/Diff     Status: Abnormal   Collection Time: 07/13/17 11:56 AM  Result Value Ref Range   WBC 15.6 Repeated and verified X2. (H) 4.0 - 10.5 K/uL   RBC 4.57 4.22 - 5.81 Mil/uL   Hemoglobin 15.4 13.0 - 17.0 g/dL   HCT 45.5 39.0 - 52.0 %   MCV 99.6 78.0 - 100.0 fl   MCHC 33.8 30.0 - 36.0 g/dL   RDW 12.8 11.5 - 15.5 %    Platelets 242.0 150.0 - 400.0 K/uL   Neutrophils Relative % 75.4 43.0 - 77.0 %   Lymphocytes Relative 14.5 12.0 - 46.0 %   Monocytes Relative 4.5 3.0 - 12.0 %   Eosinophils Relative 5.3 (H) 0.0 - 5.0 %   Basophils Relative 0.3 0.0 - 3.0 %   Neutro Abs 11.7 (H) 1.4 - 7.7 K/uL   Lymphs Abs 2.3 0.7 - 4.0 K/uL   Monocytes Absolute 0.7 0.1 - 1.0 K/uL   Eosinophils Absolute 0.8 (H) 0.0 - 0.7 K/uL   Basophils Absolute 0.0 0.0 - 0.1 K/uL  Basic metabolic panel     Status: Abnormal   Collection Time: 07/13/17 11:56 AM  Result Value Ref Range   Sodium 136 135 -  145 mEq/L   Potassium 4.7 3.5 - 5.1 mEq/L   Chloride 101 96 - 112 mEq/L   CO2 29 19 - 32 mEq/L   Glucose, Bld 114 (H) 70 - 99 mg/dL   BUN 19 6 - 23 mg/dL   Creatinine, Ser 0.98 0.40 - 1.50 mg/dL   Calcium 9.5 8.4 - 10.5 mg/dL   GFR 81.43 >60.00 mL/min    Assessment/Plan: 1. Chronic rhinitis versus 2. Chronic sinusitis, unspecified location Will obtain imaging to further assess. Stop Ipratropium. Continue other medications as directed. Will alter regimen based on CT results. - CT Maxillofacial WO CM; Future  3. Simple chronic bronchitis (HCC) Chronic wheeze. Needs maintenance inhaler. Rx Flovent. - fluticasone (FLOVENT HFA) 110 MCG/ACT inhaler; Inhale 1 puff 2 (two) times daily into the lungs.  Dispense: 1 Inhaler; Refill: 12  4. Gastroesophageal reflux disease without esophagitis Potentially silent reflux contributing to chronic cough. Start trial of PPI. Follow-up scheduled with ENT. - omeprazole (PRILOSEC) 20 MG capsule; Take 1 capsule (20 mg total) daily by mouth.  Dispense: 30 capsule; Refill: 3  5. Need for vaccination against Streptococcus pneumoniae using pneumococcal conjugate vaccine 13 Prevnar updated. - Pneumococcal conjugate vaccine 13-valent IM   Leeanne Rio, PA-C

## 2017-09-28 NOTE — Progress Notes (Signed)
Pre visit review using our clinic review tool, if applicable. No additional management support is needed unless otherwise documented below in the visit note. 

## 2017-10-02 ENCOUNTER — Telehealth: Payer: Self-pay | Admitting: *Deleted

## 2017-10-02 NOTE — Telephone Encounter (Signed)
-----   Message from Quintella Reichertraci R Turner, MD sent at 09/30/2017  8:44 PM EST ----- Good AHI and compliance.  Continue current CPAP settings.

## 2017-10-02 NOTE — Telephone Encounter (Signed)
Informed patient of compliance results and verbalized understanding was indicated. Patient understands his compliance is good and his apnea events are in normal range at 2.5. Patient understands his current settings will stay the same. Patient thanked me for calling.

## 2017-10-05 DIAGNOSIS — G4733 Obstructive sleep apnea (adult) (pediatric): Secondary | ICD-10-CM | POA: Diagnosis not present

## 2017-10-06 DIAGNOSIS — G4733 Obstructive sleep apnea (adult) (pediatric): Secondary | ICD-10-CM | POA: Diagnosis not present

## 2017-10-09 DIAGNOSIS — G4733 Obstructive sleep apnea (adult) (pediatric): Secondary | ICD-10-CM | POA: Diagnosis not present

## 2017-10-09 DIAGNOSIS — J189 Pneumonia, unspecified organism: Secondary | ICD-10-CM | POA: Diagnosis not present

## 2017-10-09 DIAGNOSIS — J441 Chronic obstructive pulmonary disease with (acute) exacerbation: Secondary | ICD-10-CM | POA: Diagnosis not present

## 2017-10-10 ENCOUNTER — Encounter: Payer: Self-pay | Admitting: Cardiology

## 2017-10-10 ENCOUNTER — Ambulatory Visit: Payer: Medicare HMO | Admitting: Cardiology

## 2017-10-10 VITALS — BP 134/76 | HR 52 | Ht 75.0 in | Wt 323.8 lb

## 2017-10-10 DIAGNOSIS — I10 Essential (primary) hypertension: Secondary | ICD-10-CM

## 2017-10-10 DIAGNOSIS — G4733 Obstructive sleep apnea (adult) (pediatric): Secondary | ICD-10-CM | POA: Diagnosis not present

## 2017-10-10 DIAGNOSIS — I421 Obstructive hypertrophic cardiomyopathy: Secondary | ICD-10-CM | POA: Diagnosis not present

## 2017-10-10 NOTE — Patient Instructions (Signed)

## 2017-10-10 NOTE — Progress Notes (Signed)
Cardiology Office Note:    Date:  10/10/2017   ID:  Warren Baker, DOB 11-Dec-1950, MRN [id number]  PCP:  Waldon MerlMartin, Lamarcus C, PA-C  Cardiologist:  Armanda Magicraci Kayman Snuffer, MD   Referring MD: Waldon MerlMartin, Joanathan C, PA-C   Chief Complaint  Patient presents with  . Follow-up    HOCM, HTN, OSA    History of Present Illness:    Warren Baker is a 66 y.o. male with a hx of mild OSA with an AHI of 11.3/hr and is on CPAP therapy.  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 He has an uncle that he SCD and a brother who had a cardiac problem and got an AICD at 65yo in setting of MI. He was referred to EP but felt that AICD was not indicated at this time.    He is here today for followup and is doing well.  He denies any chest pain or pressure, PND, orthopnea, LE edema, dizziness, palpitations or syncope. He has chronic SOB from sinus drainage which has gotten better.  He is compliant with his meds and is tolerating meds with no SE.  He is doing well with his CPAP device and thinks that he has gotten used to it.  He tolerates the full face mask and feels the pressure is adequate.  Since going on CPAP he feels rested in the am if he sleeps well the night before and has no significant daytime sleepiness.  He does have some mouth dryness.  He does not think that he snores.     Past Medical History:  Diagnosis Date  . Allergic rhinitis   . Arthritis   . Broken leg   . Bronchitis   . Carbon dioxide poisoning   . Colon polyps   . COPD (chronic obstructive pulmonary disease) (HCC)   . Environmental allergies   . Excessive daytime sleepiness 07/13/2016  . GERD (gastroesophageal reflux disease)   . History of chicken pox   . 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 Kidney stones   . Obesity (BMI 30-39.9) 07/13/2016  . OSA (obstructive sleep apnea)  11/01/2016   Mild with AHI 11/hr  . Seasonal allergies   . Snoring 07/13/2016  . Tremors of nervous system     Past Surgical History:  Procedure Laterality Date  . TONSILLECTOMY AND ADENOIDECTOMY    . WISDOM TOOTH EXTRACTION      Current Medications: Current Meds  Medication Sig  . albuterol (PROVENTIL HFA;VENTOLIN HFA) 108 (90 Base) MCG/ACT inhaler Inhale 2 puffs into the lungs every 6 (six) hours as needed for wheezing or shortness of breath.  . Ascorbic Acid (VITAMIN C) 1000 MG tablet Take 1,000 mg by mouth daily.  Marland Kitchen. aspirin 81 MG tablet Take 81 mg by mouth daily.  Marland Kitchen. atorvastatin (LIPITOR) 10 MG tablet Take 1 tablet (10 mg total) by mouth daily.  Marland Kitchen. azelastine (ASTELIN) 0.1 % nasal spray USE 2 SPRAYS IN EACH NOSTRILS TWICE A DAY AS DIRECTED  . Cholecalciferol (VITAMIN D-3) 1000 UNITS CAPS Take 1,000 Units by mouth daily.  . fluticasone (FLOVENT HFA) 110 MCG/ACT inhaler Inhale 1 puff 2 (two) times daily into the lungs.  . gabapentin (NEURONTIN) 800 MG tablet Take 800 mg by mouth 5 (five) times daily.   Marland Kitchen. levocetirizine (XYZAL) 5 MG tablet Take 1 tablet (5 mg total) by  mouth every evening.  . montelukast (SINGULAIR) 10 MG tablet Take 1 tablet (10 mg total) by mouth at bedtime.  Marland Kitchen omeprazole (PRILOSEC) 20 MG capsule Take 1 capsule (20 mg total) daily by mouth.  . primidone (MYSOLINE) 50 MG tablet Take 150 mg by mouth 2 (two) times daily.  . propranolol (INDERAL) 40 MG tablet Take 1 tablet (40 mg total) by mouth daily.  . tamsulosin (FLOMAX) 0.4 MG CAPS capsule TAKE 1 CAPSULE (0.4 MG TOTAL) BY MOUTH AT BEDTIME.  . vitamin B-12 (CYANOCOBALAMIN) 1000 MCG tablet Take 1,000 mcg by mouth daily.     Allergies:   Codeine and Propoxyphene   Social History   Socioeconomic History  . Marital status: Single    Spouse name: None  . Number of children: 3  . Years of education: None  . Highest education level: None  Social Needs  . Financial resource strain: None  . Food insecurity -  worry: None  . Food insecurity - inability: None  . Transportation needs - medical: None  . Transportation needs - non-medical: None  Occupational History  . Occupation: Mudlogger  Tobacco Use  . Smoking status: Former Smoker    Packs/day: 2.00    Years: 30.00    Pack years: 60.00    Types: Cigarettes    Last attempt to quit: 11/20/2000    Years since quitting: 16.8  . Smokeless tobacco: Never Used  Substance and Sexual Activity  . Alcohol use: No    Alcohol/week: 0.0 oz  . Drug use: No  . Sexual activity: None  Other Topics Concern  . None  Social History Narrative  . None     Family History: The patient's family history includes Alcoholism in his father; Allergies in his daughter; Arthritis in his father and sister; Breast cancer in his maternal aunt; Cancer in his other; Congestive Heart Failure (age of onset: 93) in his brother; Congestive Heart Failure (age of onset: 42) in his mother; Diabetes in his maternal aunt; Emphysema in his brother; Gallbladder disease in his daughter; Heart attack in his brother; Heart disease in his brother and mother; Kidney Stones in his daughter; Lung cancer in his maternal uncle; Migraines in his daughter; Stroke in his maternal aunt.  ROS:   Please see the history of present illness.    ROS  All other systems reviewed and negative.   EKGs/Labs/Other Studies Reviewed:    The following studies were reviewed today: none  EKG:  EKG is not ordered today.    Recent Labs: 05/31/2017: TSH 1.68 07/08/2017: B Natriuretic Peptide 71.3 07/09/2017: ALT 25 07/13/2017: BUN 19; Creatinine, Ser 0.98; Hemoglobin 15.4; Platelets 242.0; Potassium 4.7; Sodium 136   Recent Lipid Panel    Component Value Date/Time   CHOL 197 05/31/2017 0915   TRIG 97.0 05/31/2017 0915   HDL 38.10 (L) 05/31/2017 0915   CHOLHDL 5 05/31/2017 0915   VLDL 19.4 05/31/2017 0915   LDLCALC 139 (H) 05/31/2017 0915    Physical Exam:    VS:  BP 134/76   Pulse (!) 52   Ht  6\' 3"  (1.905 m)   Wt (!) 323 lb 12.8 oz (146.9 kg)   SpO2 97%   BMI 40.47 kg/m     Wt Readings from Last 3 Encounters:  10/10/17 (!) 323 lb 12.8 oz (146.9 kg)  09/28/17 (!) 326 lb (147.9 kg)  08/20/17 (!) 321 lb (145.6 kg)     GEN:  Well nourished, well developed in no acute distress  HEENT: Normal NECK: No JVD; No carotid bruits LYMPHATICS: No lymphadenopathy CARDIAC: RRR, no murmurs, rubs, gallops RESPIRATORY:  Clear to auscultation without rales, wheezing or rhonchi  ABDOMEN: Soft, non-tender, non-distended MUSCULOSKELETAL:  No edema; No deformity  SKIN: Warm and dry NEUROLOGIC:  Alert and oriented x 3 PSYCHIATRIC:  Normal affect   ASSESSMENT:    1. HOCM (hypertrophic obstructive cardiomyopathy) (HCC)   2. Essential hypertension   3. OSA (obstructive sleep apnea)    PLAN:    In order of problems listed above:  1.  HOCM - apical variant - he denies any syncope.  He has no CHF symptoms.  He was seen by EP and felt not to be candidate for AICD at this time.  I encouraged him to notify his offspring to get an echo for screening.  2.  HTN - BP is well controlled on exam today.  He will continue on Inderal 40mg  daily.  3.  OSA - the patient is tolerating PAP therapy well without any problems. The PAP download was reviewed today and showed an AHI of 2.7/hr on 11 cm H2O with 93% compliance in using more than 4 hours nightly.  The patient has been using and benefiting from CPAP use and will continue to benefit from therapy.      Medication Adjustments/Labs and Tests Ordered: Current medicines are reviewed at length with the patient today.  Concerns regarding medicines are outlined above.  No orders of the defined types were placed in this encounter.  No orders of the defined types were placed in this encounter.   Signed, Armanda Magicraci Isobel Eisenhuth, MD  10/10/2017 9:18 AM    [facility]

## 2017-10-15 ENCOUNTER — Encounter: Payer: Self-pay | Admitting: Physician Assistant

## 2017-10-15 ENCOUNTER — Other Ambulatory Visit: Payer: Self-pay

## 2017-10-15 ENCOUNTER — Ambulatory Visit: Payer: Medicare HMO | Admitting: Physician Assistant

## 2017-10-15 ENCOUNTER — Encounter: Payer: Self-pay | Admitting: Emergency Medicine

## 2017-10-15 DIAGNOSIS — J019 Acute sinusitis, unspecified: Secondary | ICD-10-CM

## 2017-10-15 DIAGNOSIS — J42 Unspecified chronic bronchitis: Secondary | ICD-10-CM | POA: Diagnosis not present

## 2017-10-15 DIAGNOSIS — B9689 Other specified bacterial agents as the cause of diseases classified elsewhere: Secondary | ICD-10-CM | POA: Diagnosis not present

## 2017-10-15 DIAGNOSIS — J449 Chronic obstructive pulmonary disease, unspecified: Secondary | ICD-10-CM | POA: Insufficient documentation

## 2017-10-15 DIAGNOSIS — R0981 Nasal congestion: Secondary | ICD-10-CM | POA: Diagnosis not present

## 2017-10-15 DIAGNOSIS — J310 Chronic rhinitis: Secondary | ICD-10-CM | POA: Diagnosis not present

## 2017-10-15 NOTE — Assessment & Plan Note (Signed)
Follow-up with ENT scheduled today. Current regimen subtherapeutic. Has CT scheduled for tomorrow. Will defer changes to specialist.

## 2017-10-15 NOTE — Patient Instructions (Signed)
Please continue Flovent. Follow-up with your ENT today as scheduled. Will defer further changes to her. Remember to inform her that you have CT of your sinuses scheduled for tomorrow.  Please take antibiotic as directed.  Increase fluid intake.  Use Saline nasal spray.  Take a daily multivitamin.  Place a humidifier in the bedroom.  Please call or return clinic if symptoms are not improving.  Sinusitis Sinusitis is redness, soreness, and swelling (inflammation) of the paranasal sinuses. Paranasal sinuses are air pockets within the bones of your face (beneath the eyes, the middle of the forehead, or above the eyes). In healthy paranasal sinuses, mucus is able to drain out, and air is able to circulate through them by way of your nose. However, when your paranasal sinuses are inflamed, mucus and air can become trapped. This can allow bacteria and other germs to grow and cause infection. Sinusitis can develop quickly and last only a short time (acute) or continue over a long period (chronic). Sinusitis that lasts for more than 12 weeks is considered chronic.  CAUSES  Causes of sinusitis include:  Allergies.  Structural abnormalities, such as displacement of the cartilage that separates your nostrils (deviated septum), which can decrease the air flow through your nose and sinuses and affect sinus drainage.  Functional abnormalities, such as when the small hairs (cilia) that line your sinuses and help remove mucus do not work properly or are not present. SYMPTOMS  Symptoms of acute and chronic sinusitis are the same. The primary symptoms are pain and pressure around the affected sinuses. Other symptoms include:  Upper toothache.  Earache.  Headache.  Bad breath.  Decreased sense of smell and taste.  A cough, which worsens when you are lying flat.  Fatigue.  Fever.  Thick drainage from your nose, which often is green and may contain pus (purulent).  Swelling and warmth over the  affected sinuses. DIAGNOSIS  Your caregiver will perform a physical exam. During the exam, your caregiver may:  Look in your nose for signs of abnormal growths in your nostrils (nasal polyps).  Tap over the affected sinus to check for signs of infection.  View the inside of your sinuses (endoscopy) with a special imaging device with a light attached (endoscope), which is inserted into your sinuses. If your caregiver suspects that you have chronic sinusitis, one or more of the following tests may be recommended:  Allergy tests.  Nasal culture A sample of mucus is taken from your nose and sent to a lab and screened for bacteria.  Nasal cytology A sample of mucus is taken from your nose and examined by your caregiver to determine if your sinusitis is related to an allergy. TREATMENT  Most cases of acute sinusitis are related to a viral infection and will resolve on their own within 10 days. Sometimes medicines are prescribed to help relieve symptoms (pain medicine, decongestants, nasal steroid sprays, or saline sprays).  However, for sinusitis related to a bacterial infection, your caregiver will prescribe antibiotic medicines. These are medicines that will help kill the bacteria causing the infection.  Rarely, sinusitis is caused by a fungal infection. In theses cases, your caregiver will prescribe antifungal medicine. For some cases of chronic sinusitis, surgery is needed. Generally, these are cases in which sinusitis recurs more than 3 times per year, despite other treatments. HOME CARE INSTRUCTIONS   Drink plenty of water. Water helps thin the mucus so your sinuses can drain more easily.  Use a humidifier.  Inhale steam  3 to 4 times a day (for example, sit in the bathroom with the shower running).  Apply a warm, moist washcloth to your face 3 to 4 times a day, or as directed by your caregiver.  Use saline nasal sprays to help moisten and clean your sinuses.  Take over-the-counter or  prescription medicines for pain, discomfort, or fever only as directed by your caregiver. SEEK IMMEDIATE MEDICAL CARE IF:  You have increasing pain or severe headaches.  You have nausea, vomiting, or drowsiness.  You have swelling around your face.  You have vision problems.  You have a stiff neck.  You have difficulty breathing. MAKE SURE YOU:   Understand these instructions.  Will watch your condition.  Will get help right away if you are not doing well or get worse. Document Released: 11/06/2005 Document Revised: 01/29/2012 Document Reviewed: 11/21/2011 [facility] Patient Information 2014 [place], [place].

## 2017-10-15 NOTE — Progress Notes (Signed)
Pre visit review using our clinic review tool, if applicable. No additional management support is needed unless otherwise documented below in the visit note. 

## 2017-10-15 NOTE — Assessment & Plan Note (Signed)
Rx Augmentin.  Increase fluids.  Rest.  Saline nasal spray.  Probiotic.  Mucinex as directed.  Humidifier in bedroom.  Call or return to clinic if symptoms are not improving.  

## 2017-10-15 NOTE — Assessment & Plan Note (Signed)
Improved with Flovent. Continue current regimen.

## 2017-10-15 NOTE — Progress Notes (Signed)
Patient presents to clinic today for follow-up of chronic rhinitis, COPD and GERD.  Chronic Rhinitis -- Currently on a regimen of singulair, zyrtec, flonase, astelin. Is followed by ENT. Denies improvement in symptoms with OTC additions. Has ENT appointment today. Patient endorses change in symptoms since last visit. Notes sinus pain, ear pain and sometimes tooth pain. Notes feeling feverish yesterday.  GERD -- Exacerbated due to chronic rhinitis. At last visit Omeprazole was added along with GERD diet. Patient endorses taking medication as directed. Denies continued reflux, belching and bloating.   COPD -- Flovent added at last visit. Patient notes improvement in baseline breathing. Declines wheezing.  Past Medical History:  Diagnosis Date  . Allergic rhinitis   . Arthritis   . Broken leg   . Bronchitis   . Carbon dioxide poisoning   . Colon polyps   . COPD (chronic obstructive pulmonary disease) (HCC)   . Environmental allergies   . Excessive daytime sleepiness 07/13/2016  . GERD (gastroesophageal reflux disease)   . History of chicken pox   . 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 Kidney stones   . Obesity (BMI 30-39.9) 07/13/2016  . OSA (obstructive sleep apnea) 11/01/2016   Mild with AHI 11/hr  . Seasonal allergies   . Snoring 07/13/2016  . Tremors of nervous system     Current Outpatient Medications on File Prior to Visit  Medication Sig Dispense Refill  . albuterol (PROVENTIL HFA;VENTOLIN HFA) 108 (90 Base) MCG/ACT inhaler Inhale 2 puffs into the lungs every 6 (six) hours as needed for wheezing or shortness of breath. 1 Inhaler 0  . Ascorbic Acid (VITAMIN C) 1000 MG tablet Take 1,000 mg by mouth daily.    Marland Kitchen aspirin 81 MG tablet Take 81 mg by mouth daily.    Marland Kitchen atorvastatin (LIPITOR) 10 MG tablet Take 1 tablet (10 mg total) by mouth daily. 90 tablet 1  . azelastine (ASTELIN) 0.1 % nasal spray USE 2  SPRAYS IN EACH NOSTRILS TWICE A DAY AS DIRECTED 30 mL 11  . Cholecalciferol (VITAMIN D-3) 1000 UNITS CAPS Take 1,000 Units by mouth daily.    . fluticasone (FLOVENT HFA) 110 MCG/ACT inhaler Inhale 1 puff 2 (two) times daily into the lungs. 1 Inhaler 12  . gabapentin (NEURONTIN) 800 MG tablet Take 800 mg by mouth 5 (five) times daily.     Marland Kitchen levocetirizine (XYZAL) 5 MG tablet Take 1 tablet (5 mg total) by mouth every evening. 30 tablet 3  . montelukast (SINGULAIR) 10 MG tablet Take 1 tablet (10 mg total) by mouth at bedtime. 30 tablet 3  . omeprazole (PRILOSEC) 20 MG capsule Take 1 capsule (20 mg total) daily by mouth. 30 capsule 3  . primidone (MYSOLINE) 50 MG tablet Take 150 mg by mouth 2 (two) times daily.    . propranolol (INDERAL) 40 MG tablet Take 1 tablet (40 mg total) by mouth daily. 30 tablet 0  . tamsulosin (FLOMAX) 0.4 MG CAPS capsule TAKE 1 CAPSULE (0.4 MG TOTAL) BY MOUTH AT BEDTIME. 30 capsule 3  . vitamin B-12 (CYANOCOBALAMIN) 1000 MCG tablet Take 1,000 mcg by mouth daily.     No current facility-administered medications on file prior to visit.     Allergies  Allergen Reactions  . Codeine Nausea And Vomiting    All Codeine Related Drugs   . Propoxyphene Nausea And Vomiting    Family History  Problem Relation Age of Onset  .  Heart disease Mother   . Congestive Heart Failure Mother [id number]       Deceased  . Alcoholism Father        Living  . Arthritis Father   . Diabetes Maternal Aunt   . Cancer Other        PGGM  . Breast cancer Maternal Aunt   . Lung cancer Maternal Uncle   . Heart disease Brother   . Heart attack Brother   . Congestive Heart Failure Brother 47       Deceased  . Stroke Maternal Aunt   . Emphysema Brother        #2  . Arthritis Sister        #1  . Allergies Daughter   . Kidney Stones Daughter   . Gallbladder disease Daughter   . Migraines Daughter     Social History   Socioeconomic History  . Marital status: Single    Spouse name: None  .  Number of children: 3  . Years of education: None  . Highest education level: None  Social Needs  . Financial resource strain: None  . Food insecurity - worry: None  . Food insecurity - inability: None  . Transportation needs - medical: None  . Transportation needs - non-medical: None  Occupational History  . Occupation: MudloggerAC Technician  Tobacco Use  . Smoking status: Former Smoker    Packs/day: 2.00    Years: 30.00    Pack years: 60.00    Types: Cigarettes    Last attempt to quit: 11/20/2000    Years since quitting: 16.9  . Smokeless tobacco: Never Used  Substance and Sexual Activity  . Alcohol use: No    Alcohol/week: 0.0 oz  . Drug use: No  . Sexual activity: None  Other Topics Concern  . None  Social History Narrative  . None   Review of Systems - See HPI.  All other ROS are negative.  BP 130/80   Pulse 61   Temp 97.8 F (36.6 C) (Oral)   Resp 16   Ht 6\' 3"  (1.905 m)   Wt (!) 322 lb (146.1 kg)   SpO2 94%   BMI 40.25 kg/m   Physical Exam  Constitutional: He is oriented to person, place, and time and well-developed, well-nourished, and in no distress.  HENT:  Head: Normocephalic and atraumatic.  Right Ear: Tympanic membrane normal.  Left Ear: Tympanic membrane normal.  Nose: Mucosal edema and rhinorrhea present. Right sinus exhibits frontal sinus tenderness. Left sinus exhibits frontal sinus tenderness.  Mouth/Throat: Uvula is midline, oropharynx is clear and moist and mucous membranes are normal.  Eyes: Conjunctivae are normal.  Neck: Neck supple.  Cardiovascular: Normal rate, regular rhythm, normal heart sounds and intact distal pulses.  Pulmonary/Chest: Effort normal and breath sounds normal. No respiratory distress. He has no wheezes. He has no rales. He exhibits no tenderness.  Lymphadenopathy:    He has no cervical adenopathy.  Neurological: He is alert and oriented to person, place, and time.  Skin: Skin is warm and dry. No rash noted.  Psychiatric:  Affect normal.  Vitals reviewed.  Assessment/Plan: COPD (chronic obstructive pulmonary disease) (HCC) Improved with Flovent. Continue current regimen.  Chronic rhinitis Follow-up with ENT scheduled today. Current regimen subtherapeutic. Has CT scheduled for tomorrow. Will defer changes to specialist.  Acute bacterial sinusitis Rx Augmentin.  Increase fluids.  Rest.  Saline nasal spray.  Probiotic.  Mucinex as directed.  Humidifier in bedroom.  Call or return  to clinic if symptoms are not improving.     Piedad ClimesWilliam Cody Estela Vinal, PA-C

## 2017-10-16 ENCOUNTER — Other Ambulatory Visit: Payer: Self-pay | Admitting: Physician Assistant

## 2017-10-16 ENCOUNTER — Other Ambulatory Visit: Payer: Medicare HMO

## 2017-10-16 MED ORDER — DOXYCYCLINE HYCLATE 100 MG PO CAPS: 100.0000 mg | ORAL_CAPSULE | Freq: Two times a day (BID) | ORAL | 0 refills | Status: DC

## 2017-10-17 NOTE — Telephone Encounter (Signed)
Patient had his 10 week follow up appointment on 10/10/17 for his insurance compliance.

## 2017-10-26 ENCOUNTER — Other Ambulatory Visit: Payer: Self-pay | Admitting: Emergency Medicine

## 2017-10-26 MED ORDER — AZELASTINE HCL 0.1 % NA SOLN: NASAL | 11 refills | Status: DC

## 2017-10-26 MED ORDER — MONTELUKAST SODIUM 10 MG PO TABS: 10.0000 mg | ORAL_TABLET | Freq: Every day | ORAL | 1 refills | Status: DC

## 2017-10-26 MED ORDER — LEVOCETIRIZINE DIHYDROCHLORIDE 5 MG PO TABS: 5.0000 mg | ORAL_TABLET | Freq: Every evening | ORAL | 1 refills | Status: DC

## 2017-10-26 MED ORDER — TAMSULOSIN HCL 0.4 MG PO CAPS: 0.4000 mg | ORAL_CAPSULE | Freq: Every day | ORAL | 1 refills | Status: DC

## 2017-11-02 ENCOUNTER — Other Ambulatory Visit: Payer: Self-pay | Admitting: Physician Assistant

## 2017-11-02 DIAGNOSIS — J343 Hypertrophy of nasal turbinates: Secondary | ICD-10-CM | POA: Diagnosis not present

## 2017-11-02 DIAGNOSIS — J0100 Acute maxillary sinusitis, unspecified: Secondary | ICD-10-CM | POA: Diagnosis not present

## 2017-11-02 DIAGNOSIS — J410 Simple chronic bronchitis: Secondary | ICD-10-CM

## 2017-11-02 DIAGNOSIS — R0981 Nasal congestion: Secondary | ICD-10-CM | POA: Diagnosis not present

## 2017-11-02 DIAGNOSIS — J019 Acute sinusitis, unspecified: Secondary | ICD-10-CM | POA: Diagnosis not present

## 2017-11-02 DIAGNOSIS — E785 Hyperlipidemia, unspecified: Secondary | ICD-10-CM

## 2017-11-02 NOTE — Telephone Encounter (Signed)
Copied from CRM [id number]. Topic: General - Other >> Nov 02, 2017  3:24 PM Raquel SarnaHayes, Teresa G wrote: [facility] sent a fax a week ago for the following refill of medications for pt.  Pt still in need of medications. [phone]  Atorvastatin 10mg  Azelastine 137 micrograms nasal spray Flo vent aerosol inhaler Levocetirizine - 5mg  Montelukast - 10mg  Tamsulosin - .4 mg

## 2017-11-05 ENCOUNTER — Telehealth: Payer: Self-pay | Admitting: Internal Medicine

## 2017-11-05 ENCOUNTER — Telehealth: Payer: Self-pay

## 2017-11-05 DIAGNOSIS — G4733 Obstructive sleep apnea (adult) (pediatric): Secondary | ICD-10-CM | POA: Diagnosis not present

## 2017-11-05 MED ORDER — ATORVASTATIN CALCIUM 10 MG PO TABS: 10.0000 mg | ORAL_TABLET | Freq: Every day | ORAL | 1 refills | Status: DC

## 2017-11-05 MED ORDER — FLUTICASONE PROPIONATE HFA 110 MCG/ACT IN AERO: 1.0000 | INHALATION_SPRAY | Freq: Two times a day (BID) | RESPIRATORY_TRACT | 12 refills | Status: DC

## 2017-11-05 NOTE — Telephone Encounter (Signed)
New Message     Patient states he is to have a procedure on on his sinus track and he has to have echo done before hand , but there is not order in system

## 2017-11-05 NOTE — Telephone Encounter (Signed)
This was sent to preop by mistake, but pt has been notified and that he states that he is to have a procedure on his sinus tract and that the lady he saw, he could only remember her first name, Marchelle Folksmanda, is telling him that he needs a echo 1st. Pt is wanting to see if Dr. Mayford Knifeurner would order for him?  Will fwd to Dr. Mayford Knifeurner and her RN to decide and contact pt back.  Pt thanked me for the call.

## 2017-11-05 NOTE — Telephone Encounter (Signed)
Spoke with patient. He states that [facility] prior to his sinus surgery. Called over to Dr. Doran HeaterMarcellino office at [phone] to speak with nurse for further information. The office was closed. Left message to call back to clarify or fax surgical clearance to [phone].

## 2017-11-05 NOTE — Telephone Encounter (Signed)
Called the patient at [phone] (M) and left VM to call office back to get scheduled per CRM. [place] be coming from [place] office to our office. Dr.Wendling accepting new Pt's at this time.

## 2017-11-05 NOTE — Telephone Encounter (Signed)
I have not met this patient before. We have not gotten any pre-op clearance requests in the pre-op box for this patient. Will route to Dr. Theodosia Blender nurse to obtain more information from requesting party. If formal pre-op clearance is needed, please ask requesting party to submit a request. Will remove from pre-op box.  Dayna Dunn PA-C

## 2017-11-05 NOTE — Telephone Encounter (Signed)
   Chart reviewed as part of pre-operative protocol coverage. Not clear from note what type of procedure or anesthesia is scheduled or who is requesting echocardiogram. Last OV 10/10/17 he was doing well with recommendation to f/u in 1 year. Pre-op callback staff, can you please get more information aligned with the standard surgical clearance form and find out who requested echo?  Laurann Montanaayna N Amadu Schlageter, PA-C 11/05/2017, 3:17 PM

## 2017-11-05 NOTE — Telephone Encounter (Signed)
This looks like it has been sent to preop pool for Ronie Spiesayna Dunn to address.  No sure why echo was requested if he is doing well and asymptomatic it would not change therapy at this time.don

## 2017-11-07 NOTE — Telephone Encounter (Signed)
Please put those in preop pool.  I do not think he needs another echo if he has not had any recent cardiac symptoms.

## 2017-11-07 NOTE — Telephone Encounter (Signed)
[facility] stated that she would fax over surgical clearance form and MD notes. Confirmed fax number of [phone].

## 2017-11-08 DIAGNOSIS — J441 Chronic obstructive pulmonary disease with (acute) exacerbation: Secondary | ICD-10-CM | POA: Diagnosis not present

## 2017-11-08 DIAGNOSIS — J189 Pneumonia, unspecified organism: Secondary | ICD-10-CM | POA: Diagnosis not present

## 2017-11-08 DIAGNOSIS — G4733 Obstructive sleep apnea (adult) (pediatric): Secondary | ICD-10-CM | POA: Diagnosis not present

## 2017-11-14 ENCOUNTER — Encounter: Payer: Self-pay | Admitting: Family Medicine

## 2017-11-14 ENCOUNTER — Ambulatory Visit: Payer: Medicare HMO | Admitting: Family Medicine

## 2017-11-14 VITALS — BP 138/83 | HR 48 | Temp 98.1°F | Ht 75.0 in | Wt 320.5 lb

## 2017-11-14 DIAGNOSIS — R0981 Nasal congestion: Secondary | ICD-10-CM | POA: Diagnosis not present

## 2017-11-14 DIAGNOSIS — Z23 Encounter for immunization: Secondary | ICD-10-CM | POA: Diagnosis not present

## 2017-11-14 MED ORDER — FLUTICASONE PROPIONATE 50 MCG/ACT NA SUSP: 2.0000 | Freq: Every day | NASAL | 2 refills | Status: DC

## 2017-11-14 NOTE — Progress Notes (Signed)
Pre visit review using our clinic review tool, if applicable. No additional management support is needed unless otherwise documented below in the visit note. 

## 2017-11-14 NOTE — Progress Notes (Signed)
Chief Complaint  Patient presents with  . Establish Care    Subjective: Patient is a 66 y.o. male here for est care and nasal congestion.  Had PNA this summer and has been having nasal congestion/sinus issues ever since. He has been seen by ENT and has a surgery scheduled for 2/22. He has been on multiple rounds of abx. Drainage will cause a cough. Denies fevers, pus draining from nose, sick contacts.   ROS: HEENT: As noted in HPI   Past Medical History:  Diagnosis Date  . Arthritis   . Broken leg   . Colon polyps   . COPD (chronic obstructive pulmonary disease) (HCC)   . Excessive daytime sleepiness 07/13/2016  . GERD (gastroesophageal reflux disease)   . History of chicken pox   . 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 Kidney stones   . OSA (obstructive sleep apnea) 11/01/2016   Mild with AHI 11/hr  . Seasonal allergies   . Tremors of nervous system      Objective: BP 138/83 (BP Location: Left Arm, Patient Position: Sitting, Cuff Size: Large)   Pulse (!) 48   Temp 98.1 F (36.7 C) (Oral)   Ht 6\' 3"  (1.905 m)   Wt (!) 320 lb 8 oz (145.4 kg)   SpO2 96%   BMI 40.06 kg/m  General: Awake, appears stated age HEENT: MMM, EOMi, ears neg b/l, nares patent w/o d/c Heart: RRR Lungs: CTAB, no rales, wheezes or rhonchi. No accessory muscle use Psych: Age appropriate judgment and insight, normal affect and mood  Assessment and Plan: Chronic nasal congestion - Plan: fluticasone (FLONASE) 50 MCG/ACT nasal spray, ipratropium (ATROVENT) 0.03 % nasal spray  Add Flonase. Cont Nasal rinses, will see how surgery goes.  F/u in 6 mo or prn. The patient voiced understanding and agreement to the plan.  Jilda Rocheicholas Paul SagamoreWendling, DO 11/14/17  9:29 AM

## 2017-11-14 NOTE — Patient Instructions (Addendum)
Continue nasal rinses.  Flonase (fluticasone); nasal spray that is over the counter. 2 sprays each nostril, once daily. Aim towards the same side eye when you spray.  There are available OTC, and the generic versions, which may be cheaper, are in parentheses. Show this to a pharmacist if you have trouble finding any of these items.  Let us know if you need anything.

## 2017-11-14 NOTE — Addendum Note (Signed)
Addended by: Scharlene GlossEWING, Theoren Palka B on: 11/14/2017 09:45 AM   Modules accepted: Orders

## 2017-12-06 DIAGNOSIS — G4733 Obstructive sleep apnea (adult) (pediatric): Secondary | ICD-10-CM | POA: Diagnosis not present

## 2017-12-09 DIAGNOSIS — G4733 Obstructive sleep apnea (adult) (pediatric): Secondary | ICD-10-CM | POA: Diagnosis not present

## 2017-12-09 DIAGNOSIS — J441 Chronic obstructive pulmonary disease with (acute) exacerbation: Secondary | ICD-10-CM | POA: Diagnosis not present

## 2017-12-09 DIAGNOSIS — J189 Pneumonia, unspecified organism: Secondary | ICD-10-CM | POA: Diagnosis not present

## 2018-01-03 ENCOUNTER — Encounter: Payer: Self-pay | Admitting: Family Medicine

## 2018-01-03 ENCOUNTER — Ambulatory Visit (INDEPENDENT_AMBULATORY_CARE_PROVIDER_SITE_OTHER): Payer: Medicare HMO | Admitting: Family Medicine

## 2018-01-03 VITALS — BP 128/82 | HR 52 | Temp 99.1°F | Ht 75.0 in | Wt 326.5 lb

## 2018-01-03 DIAGNOSIS — J0100 Acute maxillary sinusitis, unspecified: Secondary | ICD-10-CM

## 2018-01-03 DIAGNOSIS — J441 Chronic obstructive pulmonary disease with (acute) exacerbation: Secondary | ICD-10-CM | POA: Diagnosis not present

## 2018-01-03 MED ORDER — AMOXICILLIN-POT CLAVULANATE 875-125 MG PO TABS: 1.0000 | ORAL_TABLET | Freq: Two times a day (BID) | ORAL | 0 refills | Status: DC

## 2018-01-03 MED ORDER — PREDNISONE 20 MG PO TABS: 40.0000 mg | ORAL_TABLET | Freq: Every day | ORAL | 0 refills | Status: AC

## 2018-01-03 NOTE — Progress Notes (Signed)
Chief Complaint  Patient presents with  . Sinusitis  . Fever  . Cough    Warren RidgeWilliam Baker here for URI complaints.  Duration: 6 days  Associated symptoms: subjective fever, sinus congestion, sinus pain, rhinorrhea, sore throat, shortness of breath and cough Denies: itchy watery eyes, ear fullness, ear pain, ear drainage, wheezing and myalgia Treatment to date: Albuterol, ibuprofen, OTC sinus meds Sick contacts: Yes  ROS:  Const: Denies fevers HEENT: As noted in HPI Lungs: No SOB  Past Medical History:  Diagnosis Date  . Arthritis   . Broken leg   . Colon polyps   . COPD (chronic obstructive pulmonary disease) (HCC)   . Excessive daytime sleepiness 07/13/2016  . GERD (gastroesophageal reflux disease)   . History of chicken pox   . 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 Kidney stones   . OSA (obstructive sleep apnea) 11/01/2016   Mild with AHI 11/hr  . Seasonal allergies   . Tremors of nervous system    Family History  Problem Relation Age of Onset  . Heart disease Mother   . Congestive Heart Failure Mother [id number]       Deceased  . Alcoholism Father        Living  . Arthritis Father   . Diabetes Maternal Aunt   . Cancer Other        PGGM  . Breast cancer Maternal Aunt   . Lung cancer Maternal Uncle   . Heart disease Brother   . Heart attack Brother   . Congestive Heart Failure Brother 47       Deceased  . Stroke Maternal Aunt   . Emphysema Brother        #2  . Arthritis Sister        #1  . Allergies Daughter   . Kidney Stones Daughter   . Gallbladder disease Daughter   . Migraines Daughter     BP 128/82 (BP Location: Left Arm, Patient Position: Sitting, Cuff Size: Large)   Pulse (!) 52   Temp 99.1 F (37.3 C) (Oral)   Ht 6\' 3"  (1.905 m)   Wt (!) 326 lb 8 oz (148.1 kg)   SpO2 94%   BMI 40.81 kg/m  General: Awake, alert, appears stated age HEENT: AT, [place], ears patent b/l and TM's  neg, max sinuses ttp b/l, nares patent w/o discharge, pharynx pink and without exudates, MMM Neck: No masses or asymmetry Heart: RRR Lungs: Diffuse wheezing, no accessory muscle use Psych: Age appropriate judgment and insight, normal mood and affect  Acute maxillary sinusitis, recurrence not specified - Plan: amoxicillin-clavulanate (AUGMENTIN) 875-125 MG tablet  COPD exacerbation (HCC) - Plan: predniSONE (DELTASONE) 20 MG tablet  Orders as above. Pocket rx given. Augmentin for sinus infection if needed. Pred for wheezing, start that asap. Continue to push fluids, practice good hand hygiene, cover mouth when coughing. F/u prn. If starting to experience fevers, shaking, or shortness of breath, seek immediate care. Pt voiced understanding and agreement to the plan.  Warren Rocheicholas Paul Cedar FortWendling, DO 01/03/18 4:59 PM

## 2018-01-03 NOTE — Progress Notes (Signed)
Pre visit review using our clinic review tool, if applicable. No additional management support is needed unless otherwise documented below in the visit note. 

## 2018-01-03 NOTE — Patient Instructions (Addendum)
Most sinus infections are viral in etiology and antibiotics will not be helpful. That being said, if you start having worsening symptoms over 3 days, you are worsening by day 10 or not improving by day 14, go ahead and take it. You are on Day 6 as of now. The antibiotic is Augmentin.  Continue to push fluids, practice good hand hygiene, and cover your mouth if you cough.  If you start having fevers, shaking or shortness of breath, seek immediate care.  Let us know if you need anything.

## 2018-01-06 DIAGNOSIS — G4733 Obstructive sleep apnea (adult) (pediatric): Secondary | ICD-10-CM | POA: Diagnosis not present

## 2018-01-09 DIAGNOSIS — J189 Pneumonia, unspecified organism: Secondary | ICD-10-CM | POA: Diagnosis not present

## 2018-01-09 DIAGNOSIS — J441 Chronic obstructive pulmonary disease with (acute) exacerbation: Secondary | ICD-10-CM | POA: Diagnosis not present

## 2018-01-09 DIAGNOSIS — G4733 Obstructive sleep apnea (adult) (pediatric): Secondary | ICD-10-CM | POA: Diagnosis not present

## 2018-01-15 ENCOUNTER — Telehealth: Payer: Self-pay | Admitting: Cardiology

## 2018-01-15 NOTE — Telephone Encounter (Signed)
Per Tereso NewcomerScott Weaver, PA I called ENT office to confirm what type of procedure. I left a message for a call back tomorrow between the hours of 1:30-5 pm and ask for the Pre-Op Call Back Pool.   I did review chart and looks like type of procedure is Bilateral Turbinate Reduction, Bilateral Maxillary Antrostomy, Endoscopic Sinus with Navigation. I will route this to the Pre Op Pool as FYI for provider, though we will still need to verify these are the procedures that are being done.

## 2018-01-15 NOTE — Telephone Encounter (Signed)
° °  [facility] Pre-operative Risk Assessment    Request for surgical clearance:  1. What type of surgery is being performed?  Hypertrophy   2. When is this surgery scheduled?  01/25/2018  3. What type of clearance is required (medical clearance vs. Pharmacy clearance to hold med vs. Both)? Surgical   4. Are there any medications that need to be held prior to surgery and how long? ASA 5 days prior to surgery   5. Practice name and name of physician performing surgery?  Dr.Amanda Marcilino  6. What is your office phone and fax number?    [phone]   Fax  [phone]  7. Anesthesia type (None, local, MAC, general) ? general   Warren Baker 01/15/2018, 9:28 AM  _________________________________________________________________   (provider comments below)

## 2018-01-15 NOTE — Telephone Encounter (Signed)
   Primary Cardiologist:Traci Turner, MD  Patient has an apical variant hypertrophic cardiomyopathy, hypertension, obstructive sleep apnea.  Last seen by Dr. Armanda Magicraci Turner 10/10/17.  Pre op covering staff: The request states that the procedure is "hypertrophy."   Please contact surgeon's office to clarify what procedure is being done.   Tereso NewcomerScott Elienai Gailey, PA-C  01/15/2018, 4:40 PM

## 2018-01-17 DIAGNOSIS — G4733 Obstructive sleep apnea (adult) (pediatric): Secondary | ICD-10-CM | POA: Diagnosis not present

## 2018-01-17 NOTE — Telephone Encounter (Signed)
[facility]. Patient is scheduled for a Bilateral Maxillary Antrotomy, and Bilateral Turbinate Reduction. Patients surgery is scheduled for next Friday 01/25/18. Patient was scheduled for an appt at our office in Jan but cancelled. Warren Baker ask if we could expedite the response for clearance asap or patients surgery may have to be rescheduled. Warren Baker ask to be updated on status once the patient is seen. [phone]  Appt scheduled with Berton BonJanine Hammond on 01/21/18 @ 11am. Patient aware of appt and states that he will be here.

## 2018-01-17 NOTE — Pre-Procedure Instructions (Signed)
Warren Baker  01/17/2018      [id number] - [place], [place] - [id number] [place] [id number] Phone: [phone] Fax: [phone]    Your procedure is scheduled on January 25, 2018.  [facility] at 530 AM.  Call this number if you have problems the morning of surgery:  [phone]   Remember:  Do not eat food or drink liquids after midnight.  Take these medicines the morning of surgery with A SIP OF WATER propanolol (inderal), albuterol inhlaler, flovent inhaler (bring inhalers with you), amoxicillin-clavulante (augementin), flonase nasal spray-if needed, gabapentin (neurontin), ipratropium (atrovent) nasal spray-if needed, montelukast (singulair), omeprazole (prilosec).  7 days prior to surgery STOP taking any Aspirin (unless otherwise instructed by your surgeon), Aleve, Naproxen, Ibuprofen, Motrin, Advil, Goody's, BC's, all herbal medications, fish oil, and all vitamins  Continue all other medications as instructed by your physician except follow the above medication instructions before surgery   Do not wear jewelry, make-up or nail polish.  Do not wear lotions, powders, or perfumes, or deodorant.  Do not shave 48 hours prior to surgery.  Men may shave face and neck.  Do not bring valuables to the hospital.  [facility] is not responsible for any belongings or valuables.  Contacts, dentures or bridgework may not be worn into surgery.  Leave your suitcase in the car.  After surgery it may be brought to your room.  For patients admitted to the hospital, discharge time will be determined by your treatment team.  Patients discharged the day of surgery will not be allowed to drive home.   Special instructions: [place]- Preparing For Surgery  Before surgery, you can play an important role. Because skin is not sterile, your skin needs to be as free of germs as possible. You can reduce the number of germs on your  skin by washing with CHG (chlorahexidine gluconate) Soap before surgery.  CHG is an antiseptic cleaner which kills germs and bonds with the skin to continue killing germs even after washing.  Please do not use if you have an allergy to CHG or antibacterial soaps. If your skin becomes reddened/irritated stop using the CHG.  Do not shave (including legs and underarms) for at least 48 hours prior to first CHG shower. It is OK to shave your face.  Please follow these instructions carefully.   1. Shower the NIGHT BEFORE SURGERY and the MORNING OF SURGERY with CHG.   2. If you chose to wash your hair, wash your hair first as usual with your normal shampoo.  3. After you shampoo, rinse your hair and body thoroughly to remove the shampoo.  4. Use CHG as you would any other liquid soap. You can apply CHG directly to the skin and wash gently with a scrungie or a clean washcloth.   5. Apply the CHG Soap to your body ONLY FROM THE NECK DOWN.  Do not use on open wounds or open sores. Avoid contact with your eyes, ears, mouth and genitals (private parts). Wash Face and genitals (private parts)  with your normal soap.  6. Wash thoroughly, paying special attention to the area where your surgery will be performed.  7. Thoroughly rinse your body with warm water from the neck down.  8. DO NOT shower/wash with your normal soap after using and rinsing off the CHG Soap.  9. Pat yourself dry with a CLEAN TOWEL.  10. Wear CLEAN PAJAMAS to  bed the night before surgery, wear comfortable clothes the morning of surgery  11. Place CLEAN SHEETS on your bed the night of your first shower and DO NOT SLEEP WITH PETS.  Day of Surgery: Do not apply any deodorants/lotions. Please wear clean clothes to the hospital/surgery center.    Please read over the following fact sheets that you were given. Pain Booklet, Coughing and Deep Breathing and Surgical Site Infection Prevention

## 2018-01-17 NOTE — Telephone Encounter (Signed)
Follow up   Retuning call for pre op. Please call

## 2018-01-18 ENCOUNTER — Other Ambulatory Visit: Payer: Self-pay | Admitting: Emergency Medicine

## 2018-01-18 ENCOUNTER — Encounter (HOSPITAL_COMMUNITY): Payer: Self-pay

## 2018-01-18 ENCOUNTER — Other Ambulatory Visit: Payer: Self-pay

## 2018-01-18 ENCOUNTER — Encounter (HOSPITAL_COMMUNITY)
Admission: RE | Admit: 2018-01-18 | Discharge: 2018-01-18 | Disposition: A | Discharge status: Home / Self Care | Payer: Medicare HMO | Source: Ambulatory Visit | Attending: Otolaryngology | Admitting: Otolaryngology

## 2018-01-18 DIAGNOSIS — J343 Hypertrophy of nasal turbinates: Secondary | ICD-10-CM | POA: Diagnosis not present

## 2018-01-18 DIAGNOSIS — Z01812 Encounter for preprocedural laboratory examination: Secondary | ICD-10-CM | POA: Diagnosis not present

## 2018-01-18 DIAGNOSIS — J320 Chronic maxillary sinusitis: Secondary | ICD-10-CM | POA: Insufficient documentation

## 2018-01-18 DIAGNOSIS — K219 Gastro-esophageal reflux disease without esophagitis: Secondary | ICD-10-CM

## 2018-01-18 HISTORY — DX: Personal history of urinary calculi: Z87.442

## 2018-01-18 LAB — BASIC METABOLIC PANEL
Anion gap: 10 (ref 5–15)
BUN: 11 mg/dL (ref 6–20)
CALCIUM: 9.5 mg/dL (ref 8.9–10.3)
CO2: 25 mmol/L (ref 22–32)
CREATININE: 0.9 mg/dL (ref 0.61–1.24)
Chloride: 103 mmol/L (ref 101–111)
Glucose, Bld: 108 mg/dL — ABNORMAL HIGH (ref 65–99)
Potassium: 4.3 mmol/L (ref 3.5–5.1)
SODIUM: 138 mmol/L (ref 135–145)

## 2018-01-18 LAB — CBC
HCT: 44.9 % (ref 39.0–52.0)
Hemoglobin: 15.2 g/dL (ref 13.0–17.0)
MCH: 33.3 pg (ref 26.0–34.0)
MCHC: 33.9 g/dL (ref 30.0–36.0)
MCV: 98.5 fL (ref 78.0–100.0)
PLATELETS: 139 10*3/uL — AB (ref 150–400)
RBC: 4.56 MIL/uL (ref 4.22–5.81)
RDW: 12.7 % (ref 11.5–15.5)
WBC: 7.7 10*3/uL (ref 4.0–10.5)

## 2018-01-18 MED ORDER — OMEPRAZOLE 20 MG PO CPDR: 20.0000 mg | DELAYED_RELEASE_CAPSULE | Freq: Every day | ORAL | 3 refills | Status: DC

## 2018-01-18 NOTE — Progress Notes (Signed)
PCP: Arva ChafeNicholas Wendling, DO  Cardiologist: Dr. Francetta Foundraci Turner-has an appointment Monday for Cardiac Clearance  EKG: 07/08/17 in [facility]  Stress test:08/07/16 in [facility]  ECHO:08/06/16 in [facility]  Cardiac Cath: pt denies  [facility]

## 2018-01-20 NOTE — Progress Notes (Signed)
Cardiology Office Note:    Date:  01/21/2018   ID:  Warren Baker, DOB Apr 21, 1951, MRN [id number]  PCP:  Sharlene Dory, DO  Cardiologist:  Armanda Magic, MD  Referring MD: Sharlene Dory*   Chief Complaint  Patient presents with  . Pre-op Exam    History of Present Illness:    Warren Baker is a 67 y.o. 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  A cardiac MRI was ordered however the patient was too big for the MRI scanner.  He has an uncle that had sudden cardiac death in a brother who had a cardiac problem and got an AICD at age 63 in the setting of MI.  He was referred to EP but felt that AICD was not indicated at this time.  The patient was last seen by Dr. Mayford Knife on 10/10/2017 at which time he was doing well.  He was tolerating his CPAP and compliant with his medical therapies.  In 06/2016 the patient wore a Holter monitor which showed sinus bradycardia with an average heart rate of 51 bpm and he was recommended to confer with his neurologist about stopping propranolol as this was ordered for tremor.  It was felt that he was having some fatigue that may have been associated with the bradycardia. He cut his propranolol back to 1 tab bid and now is taking 1 pill int he morning and 1/2 at night. He currently denies fatigue, lightheaded, palpitations or dizziness. He has some transiet dizziness with his sinus infection and coughing.   Pt denies chest discomfort except for soreness when he coughs. He has been battling sinus infections with drainage. He has chronic mild DOE with more intense exertion, he adjusts his speed to compensate. This is not worse lately. 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 Denies orthopnea, PND edema. Wears CPAP every night.   Warren Baker is scheduled for bilateral maxillary antrotomy and bilateral turbinate reduction on Friday, 01/25/18.   The ENT physician was requesting an echocardiogram prior to the procedure, however it is felt by Dr. Mayford Knife that since the patient is doing well an echocardiogram is not indicated.  Quit smoking 2000.     Past Medical History:  Diagnosis Date  . Arthritis   . Broken leg   . Colon polyps   . COPD (chronic obstructive pulmonary disease) (HCC)   . Excessive daytime sleepiness 07/13/2016  . GERD (gastroesophageal reflux disease)   . History of chicken pox   . History of kidney stones   . 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 OSA (obstructive sleep apnea) 11/01/2016   Mild with AHI 11/hr  . Seasonal allergies   . Tremors of nervous system     Past Surgical History:  Procedure Laterality Date  . COLONOSCOPY    . TONSILLECTOMY AND ADENOIDECTOMY    . WISDOM TOOTH EXTRACTION      Current Medications: Current Meds  Medication Sig  . albuterol (PROVENTIL HFA;VENTOLIN HFA) 108 (90 Base) MCG/ACT inhaler Inhale 2 puffs into the lungs every 6 (six) hours as needed for wheezing or shortness of breath.  . Ascorbic Acid (VITAMIN C) 1000 MG tablet Take 1,000 mg by mouth daily.  Marland Kitchen aspirin 81 MG tablet Take 81 mg by mouth daily.  Marland Kitchen atorvastatin (LIPITOR) 10 MG tablet Take 1  tablet (10 mg total) by mouth daily.  . Cholecalciferol (VITAMIN D-3) 1000 UNITS CAPS Take 1,000 Units by mouth daily.  . fluticasone (FLONASE) 50 MCG/ACT nasal spray Place 2 sprays into both nostrils daily.  . fluticasone (FLOVENT HFA) 110 MCG/ACT inhaler Inhale 1 puff into the lungs 2 (two) times daily.  Marland Kitchen gabapentin (NEURONTIN) 800 MG tablet Take 800 mg by mouth 5 (five) times daily.   . GuaiFENesin (MUCINEX PO) Take 1 tablet by mouth every 4 (four) hours as needed (allergies. Patient states he doesn't recall tablet dose.).  Marland Kitchen ibuprofen (ADVIL,MOTRIN) 200 MG tablet Take 200 mg by mouth every 6 (six) hours as needed for mild pain.  Marland Kitchen ipratropium  (ATROVENT) 0.03 % nasal spray Place 2 sprays into both nostrils every 12 (twelve) hours.  Marland Kitchen levocetirizine (XYZAL) 5 MG tablet Take 1 tablet (5 mg total) by mouth every evening.  . montelukast (SINGULAIR) 10 MG tablet Take 1 tablet (10 mg total) by mouth at bedtime.  Marland Kitchen omeprazole (PRILOSEC) 20 MG capsule Take 1 capsule (20 mg total) by mouth daily.  . phenylephrine (SUDAFED PE) 10 MG TABS tablet Take 10 mg by mouth every 4 (four) hours as needed (congestion).  . primidone (MYSOLINE) 50 MG tablet Take 150 mg by mouth 2 (two) times daily.  . propranolol (INDERAL) 40 MG tablet Take 1 tablet (40 mg total) by mouth daily.  . tamsulosin (FLOMAX) 0.4 MG CAPS capsule Take 1 capsule (0.4 mg total) by mouth at bedtime.  . vitamin B-12 (CYANOCOBALAMIN) 1000 MCG tablet Take 1,000 mcg by mouth daily.     Allergies:   Codeine and Propoxyphene   Social History   Socioeconomic History  . Marital status: Single    Spouse name: None  . Number of children: 3  . Years of education: None  . Highest education level: None  Social Needs  . Financial resource strain: None  . Food insecurity - worry: None  . Food insecurity - inability: None  . Transportation needs - medical: None  . Transportation needs - non-medical: None  Occupational History  . Occupation: Mudlogger  Tobacco Use  . Smoking status: Former Smoker    Packs/day: 2.00    Years: 30.00    Pack years: 60.00    Types: Cigarettes    Last attempt to quit: 11/20/2000    Years since quitting: 17.1  . Smokeless tobacco: Never Used  Substance and Sexual Activity  . Alcohol use: No    Alcohol/week: 0.0 oz  . Drug use: No  . Sexual activity: None  Other Topics Concern  . None  Social History Narrative  . None     Family History: The patient's family history includes Alcoholism in his father; Allergies in his daughter; Arthritis in his father and sister; Breast cancer in his maternal aunt; Cancer in his other; Congestive Heart Failure  (age of onset: 45) in his brother; Congestive Heart Failure (age of onset: 49) in his mother; Diabetes in his maternal aunt; Emphysema in his brother; Gallbladder disease in his daughter; Heart attack in his brother; Heart disease in his brother and mother; Kidney Stones in his daughter; Lung cancer in his maternal uncle; Migraines in his daughter; Stroke in his maternal aunt. ROS:   Please see the history of present illness.     All other systems reviewed and are negative.  EKGs/Labs/Other Studies Reviewed:    The following studies were reviewed today:  Stress Myoview 07/27/2016 Study Highlights    Nuclear stress  EF: 52%.  The study is normal.  This is a low risk study.   Low risk stress nuclear study with normal perfusion and low normal left ventricular systolic function.   Echocardiogram 07/27/2016 Study Conclusions - Procedure narrative: Transthoracic echocardiography. Image   quality was poor. Intravenous contrast (Definity) was   administered. - Left ventricle: The cavity size was normal. Suspect apical   hypertrophic cardiomyopathy. Systolic function was normal. The   estimated ejection fraction was in the range of 60% to 65%. Wall   motion was normal; there were no regional wall motion   abnormalities. Doppler parameters are consistent with abnormal   left ventricular relaxation (grade 1 diastolic dysfunction). - Aortic valve: Poorly visualized. There was no stenosis. - Mitral valve: There was no significant regurgitation. - Right ventricle: The cavity size was normal. Systolic function   was normal. - Pulmonary arteries: No complete TR doppler jet so unable to   estimate PA systolic pressure. - Systemic veins: IVC measured 2.4 cm with normal respirophasic   variation, suggesting RA pressure 8 mmHg.  Impressions: - Normal LV size with EF 60-65%. Pattern of wall thickening   concerning for apical hypertrophic cardiomyopathy. Normal RV size  EKG:  EKG is ordered  today.  The ekg ordered today demonstrates sinus bradycardia, 48 bpm, LVH with repolarization abnormality. Unchanged from previous.  Recent Labs: 05/31/2017: TSH 1.68 07/08/2017: B Natriuretic Peptide 71.3 07/09/2017: ALT 25 01/18/2018: BUN 11; Creatinine, Ser 0.90; Hemoglobin 15.2; Platelets 139; Potassium 4.3; Sodium 138   Recent Lipid Panel    Component Value Date/Time   CHOL 197 05/31/2017 0915   TRIG 97.0 05/31/2017 0915   HDL 38.10 (L) 05/31/2017 0915   CHOLHDL 5 05/31/2017 0915   VLDL 19.4 05/31/2017 0915   LDLCALC 139 (H) 05/31/2017 0915    Physical Exam:    VS:  Ht 6\' 3"  (1.905 m)   Wt (!) 320 lb (145.2 kg)   BMI 40.00 kg/m     Wt Readings from Last 3 Encounters:  01/21/18 (!) 320 lb (145.2 kg)  01/18/18 (!) 319 lb 1.6 oz (144.7 kg)  01/03/18 (!) 326 lb 8 oz (148.1 kg)     Physical Exam  Constitutional: He is oriented to person, place, and time. He appears well-developed and well-nourished. No distress.  HENT:  Head: Normocephalic and atraumatic.  Eyes:  Conjunctiva mildly injected.   Neck: Normal range of motion. Neck supple. No JVD present. Carotid bruit is not present.  Cardiovascular: Regular rhythm, normal heart sounds and intact distal pulses. Bradycardia present. Exam reveals no gallop and no friction rub.  No murmur heard. Pulmonary/Chest: Effort normal. No respiratory distress. He has wheezes. He has no rales.  Faint wheezes on right posterior.   Abdominal: Soft. Bowel sounds are normal.  Musculoskeletal: Normal range of motion. He exhibits edema. He exhibits no deformity.  Trace pretibial edema.   Neurological: He is alert and oriented to person, place, and time.  Skin: Skin is warm and dry. He is not diaphoretic.  Psychiatric: He has a normal mood and affect. His behavior is normal. Thought content normal.    ASSESSMENT:    1. Essential hypertension   2. HOCM (hypertrophic obstructive cardiomyopathy) (HCC)   3. OSA (obstructive sleep apnea)     PLAN:    In order of problems listed above:  HOCM-apical variant: The patient denies any syncope.  He has no CHF symptoms.  Previously seen by EP and felt not to be a candidate for AICD  at this time.  He was advised by Dr. Mayford Knifeurner to notify his offspring to get an echo for screening and he told his children but they have not undergone echo yet. He says they are aware and it is up to them. I have reviewed emergency S/S and when to call or proceed to ED. Will arrange for follow up with Dr. Mayford Knifeurner in 6 months, he will call if new symptoms.   Hypertension: BP well controlled.   OSA: Compliant with CPAP  Bradycardia: HR 48 on EKG. Pt takes propranolol for hand tremors. He has cut the dose a little, but says he needs to take it in order to be able to work. He denies excessive fatigue, lightheadedness, near syncope/syncope. We discussed S/S of hypoperfusion as would need to stop the BB. He verbalizes understanding.   Upcoming ENT surgery: The patient was discussed with his cardiologist, Dr. Mayford Knifeurner. The patient is doing well with no syncope or heart failure type symptoms.  Has apical variant HOCM. He is OK to proceed with the ENT surgery from a cardiac standpoint without any additional cardiac testing.  Dehydration and over diuresis should be avoided during the peri-procedure period.   Medication Adjustments/Labs and Tests Ordered: Current medicines are reviewed at length with the patient today.  Concerns regarding medicines are outlined above. Labs and tests ordered and medication changes are outlined in the patient instructions below:  Patient Instructions  Medication Instructions:  Your physician recommends that you continue on your current medications as directed. Please refer to the Current Medication list given to you today.  Labwork: None ordered  Testing/Procedures: None ordered  Follow-Up: Your physician wants you to follow-up in 6 months with Dr. Mayford Knifeurner.  You will receive a reminder  letter in the mail two months in advance. If you don't receive a letter, please call our office to schedule the follow-up appointment.   Any Other Special Instructions Will Be Listed Below (If Applicable).  You are cleared for your ENT surgery. We will fax the requesting surgeon with today's office note.   If you need a refill on your cardiac medications before your next appointment, please call your pharmacy.      Signed, Berton BonJanine Alix Stowers, NP  01/21/2018 11:46 AM    [facility]

## 2018-01-21 ENCOUNTER — Ambulatory Visit: Payer: Medicare HMO | Admitting: Cardiology

## 2018-01-21 ENCOUNTER — Encounter: Payer: Self-pay | Admitting: Cardiology

## 2018-01-21 VITALS — Ht 75.0 in | Wt 320.0 lb

## 2018-01-21 DIAGNOSIS — G4733 Obstructive sleep apnea (adult) (pediatric): Secondary | ICD-10-CM

## 2018-01-21 DIAGNOSIS — I421 Obstructive hypertrophic cardiomyopathy: Secondary | ICD-10-CM | POA: Diagnosis not present

## 2018-01-21 DIAGNOSIS — I10 Essential (primary) hypertension: Secondary | ICD-10-CM | POA: Diagnosis not present

## 2018-01-21 DIAGNOSIS — R001 Bradycardia, unspecified: Secondary | ICD-10-CM | POA: Diagnosis not present

## 2018-01-21 NOTE — Telephone Encounter (Signed)
Faxed today's office notes from Berton BonJanine Hammond, NP to requesting surgeons office ATTN: Byrd HesselbachMaria @ [phone] with confirmation it went through.

## 2018-01-21 NOTE — Telephone Encounter (Signed)
Called to speak to Warren Baker as per her request once patient was seen in the office for his surgical clearance visit with Berton BonJanine Hammond, NP. He has been cleared for his surgical procedure. Was calling to give her a verbal over the phone that patient has been cleared. Left vm asking her to call back. Will fax office note when completed.

## 2018-01-21 NOTE — Patient Instructions (Addendum)
Medication Instructions:  Your physician recommends that you continue on your current medications as directed. Please refer to the Current Medication list given to you today.  Labwork: None ordered  Testing/Procedures: None ordered  Follow-Up: Your physician wants you to follow-up in 6 months with Dr. Mayford Knifeurner.  You will receive a reminder letter in the mail two months in advance. If you don't receive a letter, please call our office to schedule the follow-up appointment.   Any Other Special Instructions Will Be Listed Below (If Applicable).  You are cleared for your ENT surgery. We will fax the requesting surgeon with today's office note.   If you need a refill on your cardiac medications before your next appointment, please call your pharmacy.

## 2018-01-24 MED ORDER — DEXTROSE 5 % IV SOLN
3.0000 g | INTRAVENOUS | Status: AC
  Administered 2018-01-25: 3 g via INTRAVENOUS
  Filled 2018-01-24: qty 3

## 2018-01-24 NOTE — Anesthesia Preprocedure Evaluation (Addendum)
Anesthesia Evaluation  Patient identified by MRN, date of birth, ID band Patient awake    Reviewed: Allergy & Precautions, NPO status , Patient's Chart, lab work & pertinent test results  Airway Mallampati: IV  TM Distance: >3 FB Neck ROM: Full    Dental  (+) Dental Advisory Given   Pulmonary sleep apnea and Continuous Positive Airway Pressure Ventilation , COPD, former smoker,    breath sounds clear to auscultation       Cardiovascular hypertension, Pt. on medications and Pt. on home beta blockers  Rhythm:Regular Rate:Normal     Neuro/Psych negative neurological ROS     GI/Hepatic Neg liver ROS, GERD  ,  Endo/Other  Morbid obesity  Renal/GU negative Renal ROS     Musculoskeletal  (+) Arthritis ,   Abdominal   Peds  Hematology negative hematology ROS (+)   Anesthesia Other Findings   Reproductive/Obstetrics                            Lab Results  Component Value Date   WBC 7.7 01/18/2018   HGB 15.2 01/18/2018   HCT 44.9 01/18/2018   MCV 98.5 01/18/2018   PLT 139 (L) 01/18/2018   Lab Results  Component Value Date   CREATININE 0.90 01/18/2018   BUN 11 01/18/2018   NA 138 01/18/2018   K 4.3 01/18/2018   CL 103 01/18/2018   CO2 25 01/18/2018   Stress Myoview 07/27/2016 Study Highlights    Nuclear stress EF: 52%.  The study is normal.  This is a low risk study.  Low risk stress nuclear study with normal perfusion and low normal left ventricular systolic function.   Echocardiogram 07/27/2016 Study Conclusions - Procedure narrative: Transthoracic echocardiography. Imagequality was poor. Intravenous contrast (Definity) was administered. - Left ventricle: The cavity size was normal. Suspect apicalhypertrophic cardiomyopathy. Systolic function was normal. Theestimated ejection fraction was in the range of 60% to 65%. Wall motion was normal; there were no regional wall  motionabnormalities. Doppler parameters are consistent with abnormalleft ventricular relaxation (grade 1 diastolic dysfunction). - Aortic valve: Poorly visualized. There was no stenosis. - Mitral valve: There was no significant regurgitation. - Right ventricle: The cavity size was normal. Systolic functionwas normal. - Pulmonary arteries: No complete TR doppler jet so unable to estimate PA systolic pressure. - Systemic veins: IVC measured 2.4 cm with normal respirophasicvariation, suggesting RA pressure 8 mmHg.  Impressions: - Normal LV size with EF 60-65%. Pattern of wall thickening concerning for apical hypertrophic cardiomyopathy. Normal RV size    Anesthesia Physical Anesthesia Plan  ASA: III  Anesthesia Plan: General   Post-op Pain Management:    Induction: Intravenous  PONV Risk Score and Plan: 2 and Dexamethasone, Ondansetron and Treatment may vary due to age or medical condition  Airway Management Planned: Oral ETT and Video Laryngoscope Planned  Additional Equipment:   Intra-op Plan:   Post-operative Plan: Extubation in OR  Informed Consent: I have reviewed the patients History and Physical, chart, labs and discussed the procedure including the risks, benefits and alternatives for the proposed anesthesia with the patient or authorized representative who has indicated his/her understanding and acceptance.   Dental advisory given  Plan Discussed with: CRNA  Anesthesia Plan Comments:        Anesthesia Quick Evaluation

## 2018-01-25 ENCOUNTER — Encounter (HOSPITAL_COMMUNITY): Payer: Self-pay | Admitting: Surgery

## 2018-01-25 ENCOUNTER — Encounter (HOSPITAL_COMMUNITY)
Admission: RE | Disposition: A | Discharge status: Home / Self Care | Payer: Self-pay | Source: Ambulatory Visit | Attending: Otolaryngology

## 2018-01-25 ENCOUNTER — Ambulatory Visit (HOSPITAL_COMMUNITY): Payer: Medicare HMO | Admitting: Anesthesiology

## 2018-01-25 ENCOUNTER — Ambulatory Visit (HOSPITAL_COMMUNITY)
Admission: RE | Admit: 2018-01-25 | Discharge: 2018-01-26 | Disposition: A | Discharge status: Home / Self Care | Payer: Medicare HMO | Source: Ambulatory Visit | Attending: Otolaryngology | Admitting: Otolaryngology

## 2018-01-25 ENCOUNTER — Other Ambulatory Visit: Payer: Self-pay

## 2018-01-25 DIAGNOSIS — G4733 Obstructive sleep apnea (adult) (pediatric): Secondary | ICD-10-CM | POA: Diagnosis not present

## 2018-01-25 DIAGNOSIS — Z87891 Personal history of nicotine dependence: Secondary | ICD-10-CM | POA: Diagnosis not present

## 2018-01-25 DIAGNOSIS — J320 Chronic maxillary sinusitis: Secondary | ICD-10-CM | POA: Diagnosis not present

## 2018-01-25 DIAGNOSIS — Z6841 Body Mass Index (BMI) 40.0 and over, adult: Secondary | ICD-10-CM | POA: Insufficient documentation

## 2018-01-25 DIAGNOSIS — J449 Chronic obstructive pulmonary disease, unspecified: Secondary | ICD-10-CM | POA: Insufficient documentation

## 2018-01-25 DIAGNOSIS — K219 Gastro-esophageal reflux disease without esophagitis: Secondary | ICD-10-CM | POA: Diagnosis not present

## 2018-01-25 DIAGNOSIS — Z79899 Other long term (current) drug therapy: Secondary | ICD-10-CM | POA: Diagnosis not present

## 2018-01-25 DIAGNOSIS — E7800 Pure hypercholesterolemia, unspecified: Secondary | ICD-10-CM | POA: Insufficient documentation

## 2018-01-25 DIAGNOSIS — Z7982 Long term (current) use of aspirin: Secondary | ICD-10-CM | POA: Diagnosis not present

## 2018-01-25 DIAGNOSIS — Z7951 Long term (current) use of inhaled steroids: Secondary | ICD-10-CM | POA: Insufficient documentation

## 2018-01-25 DIAGNOSIS — R0981 Nasal congestion: Secondary | ICD-10-CM | POA: Diagnosis not present

## 2018-01-25 DIAGNOSIS — I10 Essential (primary) hypertension: Secondary | ICD-10-CM | POA: Insufficient documentation

## 2018-01-25 DIAGNOSIS — Z9989 Dependence on other enabling machines and devices: Secondary | ICD-10-CM | POA: Diagnosis not present

## 2018-01-25 DIAGNOSIS — J0100 Acute maxillary sinusitis, unspecified: Secondary | ICD-10-CM | POA: Diagnosis not present

## 2018-01-25 DIAGNOSIS — J343 Hypertrophy of nasal turbinates: Secondary | ICD-10-CM | POA: Insufficient documentation

## 2018-01-25 DIAGNOSIS — J329 Chronic sinusitis, unspecified: Secondary | ICD-10-CM | POA: Diagnosis present

## 2018-01-25 DIAGNOSIS — Z791 Long term (current) use of non-steroidal anti-inflammatories (NSAID): Secondary | ICD-10-CM | POA: Insufficient documentation

## 2018-01-25 HISTORY — PX: TURBINATE REDUCTION: SHX6157

## 2018-01-25 HISTORY — DX: Family history of other specified conditions: Z84.89

## 2018-01-25 HISTORY — DX: Other chronic pain: G89.29

## 2018-01-25 HISTORY — DX: Dorsalgia, unspecified: M54.9

## 2018-01-25 HISTORY — DX: Pure hypercholesterolemia, unspecified: E78.00

## 2018-01-25 HISTORY — PX: MAXILLARY ANTROSTOMY: SHX2003

## 2018-01-25 HISTORY — DX: Dependence on other enabling machines and devices: Z99.89

## 2018-01-25 HISTORY — PX: SINUS ENDO W/FUSION: SHX777

## 2018-01-25 HISTORY — DX: Obstructive sleep apnea (adult) (pediatric): G47.33

## 2018-01-25 HISTORY — PX: NASAL TURBINATE REDUCTION: SHX2072

## 2018-01-25 SURGERY — REDUCTION, NASAL TURBINATE
Anesthesia: General | Site: Nose | Laterality: Bilateral

## 2018-01-25 MED ORDER — FLUORESCEIN SODIUM 1 MG OP STRP
ORAL_STRIP | OPHTHALMIC | Status: DC | PRN
  Administered 2018-01-25: 1

## 2018-01-25 MED ORDER — SUGAMMADEX SODIUM 200 MG/2ML IV SOLN
INTRAVENOUS | Status: DC | PRN
  Administered 2018-01-25: 200 mg via INTRAVENOUS

## 2018-01-25 MED ORDER — LIDOCAINE-EPINEPHRINE 1 %-1:100000 IJ SOLN
INTRAMUSCULAR | Status: DC | PRN
  Administered 2018-01-25: 12 mL

## 2018-01-25 MED ORDER — FENTANYL CITRATE (PF) 100 MCG/2ML IJ SOLN: 25.0000 ug | INTRAMUSCULAR | Status: DC | PRN

## 2018-01-25 MED ORDER — ROCURONIUM BROMIDE 100 MG/10ML IV SOLN
INTRAVENOUS | Status: DC | PRN
  Administered 2018-01-25: 50 mg via INTRAVENOUS

## 2018-01-25 MED ORDER — PROPOFOL 10 MG/ML IV BOLUS
INTRAVENOUS | Status: AC
  Filled 2018-01-25: qty 40

## 2018-01-25 MED ORDER — 0.9 % SODIUM CHLORIDE (POUR BTL) OPTIME
TOPICAL | Status: DC | PRN
  Administered 2018-01-25: 1000 mL

## 2018-01-25 MED ORDER — SODIUM CHLORIDE 0.9 % IR SOLN
Status: DC | PRN
  Administered 2018-01-25: 1000 mL

## 2018-01-25 MED ORDER — ARTIFICIAL TEARS OPHTHALMIC OINT
TOPICAL_OINTMENT | OPHTHALMIC | Status: AC
  Filled 2018-01-25: qty 3.5

## 2018-01-25 MED ORDER — HYDROCODONE-ACETAMINOPHEN 5-325 MG PO TABS: 1.0000 | ORAL_TABLET | ORAL | 0 refills | Status: DC | PRN

## 2018-01-25 MED ORDER — ACETAMINOPHEN 650 MG RE SUPP
650.0000 mg | RECTAL | Status: DC | PRN
  Filled 2018-01-25: qty 1

## 2018-01-25 MED ORDER — SUGAMMADEX SODIUM 200 MG/2ML IV SOLN
INTRAVENOUS | Status: AC
  Filled 2018-01-25: qty 2

## 2018-01-25 MED ORDER — FENTANYL CITRATE (PF) 250 MCG/5ML IJ SOLN
INTRAMUSCULAR | Status: AC
  Filled 2018-01-25: qty 5

## 2018-01-25 MED ORDER — ONDANSETRON HCL 4 MG PO TABS: 4.0000 mg | ORAL_TABLET | Freq: Three times a day (TID) | ORAL | 0 refills | Status: DC | PRN

## 2018-01-25 MED ORDER — ONDANSETRON HCL 4 MG/2ML IJ SOLN: 4.0000 mg | Freq: Once | INTRAMUSCULAR | Status: DC | PRN

## 2018-01-25 MED ORDER — MUPIROCIN 2 % EX OINT
TOPICAL_OINTMENT | CUTANEOUS | Status: AC
  Filled 2018-01-25: qty 22

## 2018-01-25 MED ORDER — HYDROCODONE-ACETAMINOPHEN 5-325 MG PO TABS: 1.0000 | ORAL_TABLET | ORAL | Status: DC | PRN

## 2018-01-25 MED ORDER — PHENOL 1.4 % MT LIQD: 1.0000 | OROMUCOSAL | Status: DC | PRN

## 2018-01-25 MED ORDER — ROCURONIUM BROMIDE 50 MG/5ML IV SOLN
INTRAVENOUS | Status: AC
  Filled 2018-01-25: qty 1

## 2018-01-25 MED ORDER — SALINE SPRAY 0.65 % NA SOLN
2.0000 | NASAL | Status: DC
  Administered 2018-01-25 – 2018-01-26 (×6): 2 via NASAL
  Filled 2018-01-25: qty 44

## 2018-01-25 MED ORDER — EPINEPHRINE HCL (NASAL) 0.1 % NA SOLN
NASAL | Status: DC | PRN
  Administered 2018-01-25: 30 mL via TOPICAL

## 2018-01-25 MED ORDER — ONDANSETRON HCL 4 MG/2ML IJ SOLN
INTRAMUSCULAR | Status: DC | PRN
  Administered 2018-01-25 (×2): 4 mg via INTRAVENOUS

## 2018-01-25 MED ORDER — PHENYLEPHRINE 40 MCG/ML (10ML) SYRINGE FOR IV PUSH (FOR BLOOD PRESSURE SUPPORT)
PREFILLED_SYRINGE | INTRAVENOUS | Status: DC | PRN
  Administered 2018-01-25 (×10): 80 ug via INTRAVENOUS

## 2018-01-25 MED ORDER — PHENYLEPHRINE 40 MCG/ML (10ML) SYRINGE FOR IV PUSH (FOR BLOOD PRESSURE SUPPORT)
PREFILLED_SYRINGE | INTRAVENOUS | Status: AC
  Filled 2018-01-25: qty 10

## 2018-01-25 MED ORDER — FLUORESCEIN SODIUM 0.6 MG OP STRP
ORAL_STRIP | OPHTHALMIC | Status: AC
  Filled 2018-01-25: qty 1

## 2018-01-25 MED ORDER — DEXAMETHASONE SODIUM PHOSPHATE 10 MG/ML IJ SOLN
INTRAMUSCULAR | Status: DC | PRN
  Administered 2018-01-25: 10 mg via INTRAVENOUS

## 2018-01-25 MED ORDER — LIDOCAINE-EPINEPHRINE 1 %-1:100000 IJ SOLN
INTRAMUSCULAR | Status: AC
  Filled 2018-01-25: qty 1

## 2018-01-25 MED ORDER — FENTANYL CITRATE (PF) 100 MCG/2ML IJ SOLN
INTRAMUSCULAR | Status: DC | PRN
  Administered 2018-01-25: 50 ug via INTRAVENOUS

## 2018-01-25 MED ORDER — ACETAMINOPHEN 160 MG/5ML PO SOLN
650.0000 mg | ORAL | Status: DC | PRN
  Administered 2018-01-25 – 2018-01-26 (×2): 650 mg via ORAL
  Filled 2018-01-25 (×2): qty 20.3

## 2018-01-25 MED ORDER — ONDANSETRON HCL 4 MG PO TABS: 4.0000 mg | ORAL_TABLET | ORAL | Status: DC | PRN

## 2018-01-25 MED ORDER — MIDAZOLAM HCL 2 MG/2ML IJ SOLN
INTRAMUSCULAR | Status: AC
  Filled 2018-01-25: qty 2

## 2018-01-25 MED ORDER — MIDAZOLAM HCL 5 MG/5ML IJ SOLN
INTRAMUSCULAR | Status: DC | PRN
  Administered 2018-01-25: 2 mg via INTRAVENOUS

## 2018-01-25 MED ORDER — OXYMETAZOLINE HCL 0.05 % NA SOLN
NASAL | Status: AC
  Filled 2018-01-25: qty 15

## 2018-01-25 MED ORDER — SUCCINYLCHOLINE CHLORIDE 200 MG/10ML IV SOSY
PREFILLED_SYRINGE | INTRAVENOUS | Status: DC | PRN
  Administered 2018-01-25: 140 mg via INTRAVENOUS

## 2018-01-25 MED ORDER — DEXMEDETOMIDINE HCL 200 MCG/2ML IV SOLN
INTRAVENOUS | Status: DC | PRN
  Administered 2018-01-25 (×8): 4 ug via INTRAVENOUS

## 2018-01-25 MED ORDER — LIDOCAINE 2% (20 MG/ML) 5 ML SYRINGE
INTRAMUSCULAR | Status: DC | PRN
  Administered 2018-01-25: 40 mg via INTRAVENOUS

## 2018-01-25 MED ORDER — ARTIFICIAL TEARS OPHTHALMIC OINT
TOPICAL_OINTMENT | OPHTHALMIC | Status: DC | PRN
  Administered 2018-01-25: 1 via OPHTHALMIC

## 2018-01-25 MED ORDER — PROPOFOL 10 MG/ML IV BOLUS
INTRAVENOUS | Status: DC | PRN
  Administered 2018-01-25: 25 mg via INTRAVENOUS
  Administered 2018-01-25: 300 mg via INTRAVENOUS

## 2018-01-25 MED ORDER — ONDANSETRON HCL 4 MG/2ML IJ SOLN: 4.0000 mg | INTRAMUSCULAR | Status: DC | PRN

## 2018-01-25 MED ORDER — MUPIROCIN 2 % EX OINT
TOPICAL_OINTMENT | CUTANEOUS | Status: DC | PRN
  Administered 2018-01-25: 1 via NASAL

## 2018-01-25 MED ORDER — GLYCOPYRROLATE 0.2 MG/ML IV SOSY
PREFILLED_SYRINGE | INTRAVENOUS | Status: DC | PRN
  Administered 2018-01-25 (×2): .2 mg via INTRAVENOUS

## 2018-01-25 MED ORDER — LACTATED RINGERS IV SOLN
INTRAVENOUS | Status: DC | PRN
  Administered 2018-01-25 (×2): via INTRAVENOUS

## 2018-01-25 MED ORDER — EPINEPHRINE HCL (NASAL) 0.1 % NA SOLN
NASAL | Status: AC
  Filled 2018-01-25: qty 30

## 2018-01-25 MED ORDER — LIDOCAINE HCL (CARDIAC) 20 MG/ML IV SOLN
INTRAVENOUS | Status: AC
Start: 2018-01-25 — End: 2018-01-25
  Filled 2018-01-25: qty 5

## 2018-01-25 MED ORDER — EPHEDRINE SULFATE-NACL 50-0.9 MG/10ML-% IV SOSY
PREFILLED_SYRINGE | INTRAVENOUS | Status: DC | PRN
  Administered 2018-01-25 (×5): 10 mg via INTRAVENOUS

## 2018-01-25 MED ORDER — ONDANSETRON HCL 4 MG/2ML IJ SOLN
INTRAMUSCULAR | Status: AC
  Filled 2018-01-25: qty 4

## 2018-01-25 MED ORDER — DEXTROSE-NACL 5-0.9 % IV SOLN
INTRAVENOUS | Status: DC
  Administered 2018-01-25: 13:00:00 via INTRAVENOUS

## 2018-01-25 MED ORDER — DEXAMETHASONE SODIUM PHOSPHATE 10 MG/ML IJ SOLN
INTRAMUSCULAR | Status: AC
  Filled 2018-01-25: qty 1

## 2018-01-25 MED ORDER — OXYMETAZOLINE HCL 0.05 % NA SOLN
NASAL | Status: DC | PRN
  Administered 2018-01-25: 2

## 2018-01-25 SURGICAL SUPPLY — 48 items
BLADE INF TURB ROT M4 2 5PK (BLADE) ×2 IMPLANT
BLADE RAD 40 CVD SINUS 4MM (BLADE) IMPLANT
BLADE RAD40 ROTATE 4M 4 5PK (BLADE) IMPLANT
BLADE RAD60 ROTATE M4 4 5PK (BLADE) IMPLANT
BLADE ROTATE TRICUT 4X13 M4 (BLADE) IMPLANT
BLADE SURG 15 STRL LF DISP TIS (BLADE) IMPLANT
BLADE SURG 15 STRL SS (BLADE)
BLADE TRICUT ROTATE M4 4 5PK (BLADE) ×2 IMPLANT
CANISTER SUC SOCK COL 7 IN (MISCELLANEOUS) IMPLANT
CANISTER SUCT 3000ML PPV (MISCELLANEOUS) ×2 IMPLANT
COAGULATOR SUCT SWTCH 10FR 6 (ELECTROSURGICAL) IMPLANT
CONT SPEC 4OZ CLIKSEAL STRL BL (MISCELLANEOUS) ×4 IMPLANT
CRADLE DONUT ADULT HEAD (MISCELLANEOUS) ×2 IMPLANT
DRAPE HALF SHEET 40X57 (DRAPES) IMPLANT
DRAPE WARM FLUID 44X44 (DRAPE) IMPLANT
DRESSING NASAL POPE 10X1.5X2.5 (GAUZE/BANDAGES/DRESSINGS) IMPLANT
DRESSING TELFA 8X10 (GAUZE/BANDAGES/DRESSINGS) IMPLANT
DRSG NASAL POPE 10X1.5X2.5 (GAUZE/BANDAGES/DRESSINGS)
DRSG NASOPORE 8CM (GAUZE/BANDAGES/DRESSINGS) ×2 IMPLANT
ELECT REM PT RETURN 9FT ADLT (ELECTROSURGICAL)
ELECTRODE REM PT RTRN 9FT ADLT (ELECTROSURGICAL) IMPLANT
GLOVE BIO SURGEON STRL SZ 6.5 (GLOVE) ×2 IMPLANT
GLOVE ECLIPSE 8.0 STRL XLNG CF (GLOVE) ×2 IMPLANT
GOWN STRL REUS W/ TWL LRG LVL3 (GOWN DISPOSABLE) ×2 IMPLANT
GOWN STRL REUS W/ TWL XL LVL3 (GOWN DISPOSABLE) ×1 IMPLANT
GOWN STRL REUS W/TWL LRG LVL3 (GOWN DISPOSABLE) ×2
GOWN STRL REUS W/TWL XL LVL3 (GOWN DISPOSABLE) ×1
KIT BASIN OR (CUSTOM PROCEDURE TRAY) ×2 IMPLANT
KIT TURNOVER KIT B (KITS) ×2 IMPLANT
NEEDLE HYPO 25GX1X1/2 BEV (NEEDLE) ×2 IMPLANT
NEEDLE SPNL 22GX3.5 QUINCKE BK (NEEDLE) ×2 IMPLANT
NS IRRIG 1000ML POUR BTL (IV SOLUTION) ×2 IMPLANT
PAD ARMBOARD 7.5X6 YLW CONV (MISCELLANEOUS) ×4 IMPLANT
PAD ENT ADHESIVE 25PK (MISCELLANEOUS) ×2 IMPLANT
PATTIES SURGICAL .5X1.5 (GAUZE/BANDAGES/DRESSINGS) ×2 IMPLANT
SOLUTION ANTI FOG 6CC (MISCELLANEOUS) ×2 IMPLANT
SUT ETHILON 3 0 PS 1 (SUTURE) IMPLANT
SWAB COLLECTION DEVICE MRSA (MISCELLANEOUS) IMPLANT
SWAB CULTURE ESWAB REG 1ML (MISCELLANEOUS) IMPLANT
SYR 20CC LL (SYRINGE) ×2 IMPLANT
SYR 50ML SLIP (SYRINGE) IMPLANT
TRACKER ENT INSTRUMENT (MISCELLANEOUS) ×2 IMPLANT
TRACKER ENT PATIENT (MISCELLANEOUS) ×2 IMPLANT
TRAY ENT MC OR (CUSTOM PROCEDURE TRAY) ×2 IMPLANT
TUBE CONNECTING 12X1/4 (SUCTIONS) ×2 IMPLANT
TUBING STRAIGHTSHOT EPS 5PK (TUBING) ×2 IMPLANT
WATER STERILE IRR 1000ML POUR (IV SOLUTION) ×2 IMPLANT
WIPE INSTRUMENT ADHESIVE BACK (MISCELLANEOUS) ×2 IMPLANT

## 2018-01-25 NOTE — Discharge Instructions (Addendum)
Avoid nose blowing x 14 days. Use over-the-counter nasal saline spray several times a day, every day. Use it as needed for nasal congestion.   Do not place anything except for nasal saline spray inside your nose- no picking.   Begin saline irrigations tomorrow (Saturday).   You may use your CPAP.   You may shower as normal.   No driving while taking narcotic pain medications. Do not take more pain medication than prescribed. Take the narcotic ONLY if pain is not controlled with tylenol or ibuprofen. Avoid taking the narcotic unless you are in very severe, uncontrolled pain.   Do not take any additional tylenol/acetaminophen while taking the narcotic pain medication.   You may find it helpful to sleep in a recliner for the next night or two, or to elevate your head during sleep. Avoid bending over or straining.

## 2018-01-25 NOTE — Anesthesia Procedure Notes (Signed)
Procedure Name: Intubation Date/Time: 01/25/2018 7:44 AM Performed by: Marny Lowensteinapozzi, Treniyah Lynn W, CRNA Pre-anesthesia Checklist: Patient identified, Emergency Drugs available, Suction available, Patient being monitored and Timeout performed Patient Re-evaluated:Patient Re-evaluated prior to induction Oxygen Delivery Method: Circle system utilized Preoxygenation: Pre-oxygenation with 100% oxygen Induction Type: IV induction Ventilation: Oral airway inserted - appropriate to patient size and Mask ventilation without difficulty Laryngoscope Size: Glidescope and 4 Grade View: Grade I Tube type: Oral Tube size: 8.0 mm Number of attempts: 1 Airway Equipment and Method: Patient positioned with wedge pillow,  Rigid stylet and Video-laryngoscopy Placement Confirmation: ETT inserted through vocal cords under direct vision,  positive ETCO2,  CO2 detector and breath sounds checked- equal and bilateral Secured at: 23 cm Tube secured with: Tape Dental Injury: Teeth and Oropharynx as per pre-operative assessment  Difficulty Due To: Difficulty was anticipated, Difficult Airway- due to reduced neck mobility and Difficult Airway- due to large tongue

## 2018-01-25 NOTE — H&P (Signed)
The surgical history remains accurate and without interval change. The condition still exists which makes the procedure necessary. The patient and/or family is aware of their condition and has been informed of the risks and benefits of surgery, as well as alternatives. All parties have elected to proceed with surgery.   Surgical plan: FESS with image guidance, bilateral maxillary antrostomy, bilateral inferior turbinate submucosal resection.   We discussed the risk of persistent sinus disease in the setting of unaddressed poor dentition. We discussed the risk of CSF leakage, temporary or permanent vision changes or changes to sense of smell, pain, infection, bleeding, need for further surgery. Will stay one night overnight 2/2 OSA history.   Otolaryngology Return Patient Note  Subjective: [name] is a 67 y.o. male seen in follow up today. He recently completed antibiotics approximately 1 week ago. He reports that he continues to have significant bilateral maxillary sinus pain and pressure. He continues to have drainage. Change in sense of smell. He is not well through his nose. He does wear CPAP at night. He had a CT scan of his sinuses done prior to visit today. He continues to use corticosteroid nasal spray daily use to use medications regular. He reports having some teeth that are loose. He often pulls these himself at home.   Sees Dr. Mayford Knife as his current chest. Reports that he has had some heart trouble in the past. No prior stents. He does take aspirin 81 mg daily. I have asked him to stop venous 5 days prior to surgery  Past Medical History:  Diagnosis Date  . Allergy  . Arthritis  . Hearing loss  . High cholesterol  . Neuromuscular disorder (HCC)  . Sleep apnea  . Tremors of nervous system   Past Surgical History:  Procedure Laterality Date  . TONSILLECTOMY  . WISDOM TOOTH EXTRACTION   Family History  Problem Relation Age of Onset  . Heart disease Brother  .  Arthritis Brother   Social History   Social History  . Marital status: Married  Spouse name: N/A  . Number of children: N/A  . Years of education: N/A   Occupational History  . Not on file.   Social History Main Topics  . Smoking status: Former Smoker  Packs/day: 1.00  Start date: 01/24/1999  . Smokeless tobacco: Never Used  . Alcohol use 5.0 oz/week  1 Glasses of wine (5 oz./glass) per week  . Drug use: No  . Sexual activity: Not on file   Other Topics Concern  . Not on file   Social History Narrative  . No narrative on file   Allergies  Allergen Reactions  . Codeine Nausea (intolerance) and Nausea & Vomiting (ALLERGY/intolerance)  . Iodinated Contrast- Oral And Iv Dye Nausea & Vomiting (ALLERGY/intolerance)   Updated Medication List:   azelastine (ASTELIN) 137 mcg (0.1 %) nasal spray  Sig: USE 2 SPRAYS IN EACH NOSTRILS TWICE A DAY AS DIRECTED  Class: Historical Med  doxycycline (VIBRAMYCIN) 100 MG capsule  Sig - Route: Take by mouth. - Oral  Class: Historical Med  levocetirizine (XYZAL) 5 MG tablet  Sig - Route: Take by mouth. - Oral  Class: Historical Med  montelukast (SINGULAIR) 10 mg tablet  Sig - Route: Take by mouth. - Oral  Class: Historical Med  tamsulosin (FLOMAX) 0.4 mg Cp24 capsule  Sig - Route: Take by mouth. - Oral  Class: Historical Med  albuterol 90 mcg/actuation inhaler  Sig - Route: Inhale 2 puffs into  the lungs. - Inhalation  Class: Historical Med  ascorbic acid, vitamin C, (VITAMIN C) 1000 MG tablet  Sig - Route: Take by mouth. - Oral  Class: Historical Med  ascorbic acid, vitamin C, (VITAMIN C) 1000 MG tablet  Sig - Route: Take by mouth. - Oral  Class: Historical Med  aspirin 81 MG EC tablet  Sig - Route: Take by mouth daily. - Oral  Class: Historical Med  aspirin-calcium carbonate 81 mg-300 mg calcium(777 mg) Tab tablet *ANTIPLATELET*  Sig - Route: Take by mouth. - Oral  Class: Historical Med  aspirin-calcium carbonate 81 mg-300 mg  calcium(777 mg) Tab tablet *ANTIPLATELET*  Sig - Route: Take by mouth. - Oral  Class: Historical Med  atorvastatin (LIPITOR) 10 MG tablet  Sig: TAKE 1 TABLET BY MOUTH EVERY DAY  Class: Historical Med  atorvastatin (LIPITOR) 10 MG tablet  Sig - Route: Take by mouth. - Oral  Class: Historical Med  azelastine (ASTELIN) 137 mcg (0.1 %) nasal spray  Class: Historical Med  cetirizine (ZYRTEC) 10 MG tablet  Sig - Route: Take 10 mg by mouth daily. - Oral  Class: Historical Med  cetirizine 10 mg Cap  Sig - Route: Take by mouth. - Oral  Class: Historical Med  cholecalciferol, vitamin D3, (VITAMIN D3) 1,000 unit capsule  Sig - Route: Take by mouth. - Oral  Class: Historical Med  cholecalciferol, vitamin D3, 1,000 unit capsule  Sig - Route: Take 1,000 Units by mouth. - Oral  Class: Historical Med  cyanocobalamin (VITAMIN B12) 1000 MCG tablet  Sig - Route: Take by mouth. - Oral  Class: Historical Med  cyanocobalamin (VITAMIN B12) 1000 MCG tablet  Sig - Route: Take 1,000 mcg by mouth. - Oral  Class: Historical Med  fluticasone (FLONASE) 50 mcg/actuation nasal spray  Sig - Route: 1 spray by Nasal route daily. - Nasal  Class: Historical Med  fluticasone (FLOVENT HFA) 110 mcg/actuation inhaler  Sig - Route: Inhale 1 puff into the lungs. - Inhalation  Class: Historical Med  gabapentin (NEURONTIN) 800 MG tablet  Sig - Route: Take 800 mg by mouth 3 times daily. - Oral  Class: Historical Med  gabapentin (NEURONTIN) 800 MG tablet  Sig - Route: Take by mouth. - Oral  Class: Historical Med  gabapentin (NEURONTIN) 800 MG tablet  Sig - Route: Take by mouth. - Oral  Class: Historical Med  ibuprofen (ADVIL,MOTRIN) 600 MG tablet  Sig - Route: Take 600 mg by mouth. - Oral  Class: Historical Med  ipratropium (ATROVENT) 0.03 % nasal spray  Sig - Route: 2 sprays by Nasal route 2 times daily. - Nasal  ipratropium-albuterol (DUO-NEB) 0.5 mg-3 mg(2.5 mg base)/3 mL nebulizer solution  Class: Historical  Med  levocetirizine (XYZAL) 5 MG tablet  Sig - Route: Take by mouth. - Oral  Class: Historical Med  metoPROLOL tartrate (LOPRESSOR) 25 MG tablet  Sig - Route: Take by mouth. - Oral  Class: Historical Med  montelukast (SINGULAIR) 10 mg tablet  Sig - Route: Take by mouth. - Oral  Class: Historical Med  naproxen sodium (ANAPROX) 550 MG tablet  Sig - Route: Take 550 mg by mouth 2 times daily with meals. - Oral  Class: Historical Med  omeprazole (PRILOSEC) 20 MG capsule  Sig: TAKE 1 CAPSULE (20 MG TOTAL) DAILY BY MOUTH.  Class: Historical Med  oxyCODONE-acetaminophen (PERCOCET) 5-325 mg per tablet  Sig - Route: Take 1 tablet by mouth every 4 (four) hours as needed for Pain. - Oral  Class:  Historical Med  primidone (MYSOLINE) 50 MG tablet  Class: Historical Med  primidone (MYSOLINE) 50 MG tablet  Sig - Route: Take by mouth. - Oral  Class: Historical Med  propranolol (INDERAL) 40 MG tablet  Sig - Route: Take 40 mg by mouth. - Oral  Class: Historical Med  propranolol (INDERAL) 40 MG tablet  Sig - Route: Take by mouth. - Oral  Class: Historical Med  tamsulosin (FLOMAX) 0.4 mg Cp24 capsule  Sig: TAKE 1 CAPSULE (0.4 MG TOTAL) BY MOUTH NIGHTLY.  Class: Historical Med    ROS A 12-pt review of systems was conducted and was negative except as stated in the HPI.   Objective: There were no vitals filed for this visit. Physical Exam:  General Normocephalic, Awake, Alert  Eyes PERRL, no scleral icterus or conjunctival hemorrhage. EOMI.  Ears Right ear- canal patent, minimal cerumen. TM intact, no significant retractions, no middle ear effusion, normal landmarks.  Left ear- canal patent, minimal cerumen. TM intact, no significant retractions, no middle ear effusion, normal landmarks.  Nose Patent, No polyps or masses seen. Bilateral inferior turbinate hypertrophy with the septum gets very slightly to the left.  Oral Pharynx No mucosal lesions or tumors seen. Tension. Multiple missing  molars. Multiple incisors are loose and in poor shape.  Lymphatics No cervical lymphadenopathy  Endocrine No thyroidmegaly, no thyroid masses palpated  Cardio-vascular No cyanosis, cardiac auscultation - regular rate, no murmur  Pulmonary No audible stridor, Breathing easily with no labor.  Neuro Symmetric facial movement. Tongue protrudes in midline.  Psychiatry Appropriate affect and mood for clinic visit.  Skin No scars or lesions on face or neck.    CT scan obtained today personally reviewed. The scan reveals bilateral maxillary sinusitis. Difficult to fully analyze scan due to patient motion there may be a hilar cell right-sided ethmoid, sphenoid, frontal sinuses are completely clear. Septum appears straight. He is a Keros class III. No concha bullosa  Assessment:  My impression is that Chrissie NoaWilliam has  1. Chronic nasal congestion  2. Hypertrophy of both inferior nasal turbinates  .    Plan:  1. To OR for bilateral FESS, maxillary antrostomy, bilateral inferior turbinate reduction

## 2018-01-25 NOTE — Op Note (Signed)
DATE OF PROCEDURE:  01/25/2018    PRE-OPERATIVE DIAGNOSIS: TURBINATE HYPERTROPHY Chronic maxillary sinusitis Chronic nasal congestion   POST-OPERATIVE DIAGNOSIS: Same    PROCEDURE(S): Functional endoscopic sinus surgery with image guidance for one hour Bilateral maxillary antrostomy Bilateral inferior turbinate submucosal resection Outfracture of bilateral inferior turbinates   SURGEON: Misty StanleyAmanda Jo Marcellino, MD    ASSISTANT(S): none    ANESTHESIA:  General endotracheal anesthesia   ESTIMATED BLOOD LOSS: 20 mL   SPECIMENS: Right and left sinus contents   COMPLICATIONS: None   OPERATIVE FINDINGS: Very inflamed maxillary sinus mucosa with thick but clear rhinorrhea bilaterally. Smaller Haller cell on the left.  Moderate inferior turbinate hypertrophy. No nasal polyposis.    OPERATIVE DETAILS: Anesthesia and Prep -- After being properly identified in the preoperative holding area, the patient was brought into the operating suite. He was placed supine on the operating table. A pre-procedural time-out was performed. Pre-operative antibiotics and steroids were administered. After induction of general anesthesia, the patient was successfully intubated with confirmation of tube placement via CO2 return. The bed was then turned 180 degrees. Eyes were taped closed. Pledgets soaked in 1:1000 fluorescein dyed epinephrine were placed in each nostril. The face was draped sterilely.  Image Guidance Registration and Prep -- The registration sticker for the [facility] image guidance system was placed on the forehead. The face was registered with good correlation. Landmarks were checked with the probe which showed satisfactory accuracy.   Bilateral nasal endoscopy with maxillary antrostomy -- On both sides, 1% Lidocaine with 1:100,000 epinephrine was used to infiltrate the axilla of the middle turbinate, head of the middle turbinate and the sphenopalatine artery. The left middle  turbinate was medialized using a freer. The maxillary seeker was inserted and slipped behind the uncinate process. This was pulled anteriorly exposing the edge. A pediatric back-biter was inserted and the uncinate was cut anteriorly toward but not into the nasolacrimal duct. The microdebrider was used to remove the uncinate process above and below the cut made by the back-biter. A curved suction was inserted into the maxillary sinus through the antrostomy and pushed posteriorly and inferiorly to enlarge the opening. The microdebrider was used to clean up the edges of bone and mucosa. A 30 degree endoscope was then attached and used to view the sinus. The natural os was identified and the maxillary antrostomy was complete. The sinus was copiously irrigated and suctioned.   Bilateral inferior turbinate submucosal reduction with outfracture -- The heads of the inferior turbinates were injected with 1% lidocaine with 1:100,000 epinephrine. A stab incision was made into the heads of the turbinates. A cottle was used to elevate a submocosal pocket. A microdebrider turbinate blade was used to remove the submucosal tissue of both inferior turbinates.  A butter knife was used to outfracture both inferior turbinates giving a wide open nasal passage.   Conclusion -- Both sides were inspected and no orbital fat or evidence of cerebral spinal fluid leak were observed. Both sides were irrigated and clot removed. Nasopore was inserted lateral to the middle turbinate bilaterally. A flexible suction catheter was used to remove fluid and blood from the oropharynx and hypopharynx. Tape was removed from the eyelids and the eyes were checked for tension or proptosis, none was observed. The image guidance sticker was removed. The patient's skin was cleaned. He was returned to the care of the anesthesia team. He was then weaned from the anesthetic and transported to the PACU in stable condition.

## 2018-01-25 NOTE — Transfer of Care (Signed)
Immediate Anesthesia Transfer of Care Note  Patient: [name]  Procedure(s) Performed: BILATERAL TURBINATE REDUCTION (Bilateral Nose) BILATERAL MAXILLARY ANTROSTOMY (Bilateral Nose) ENDOSCOPIC SINUS SURGERY WITH NAVIGATION (Bilateral Nose)  Patient Location: PACU  Anesthesia Type:General  Level of Consciousness: awake and alert   Airway & Oxygen Therapy: Patient Spontanous Breathing and Patient connected to face mask oxygen  Post-op Assessment: Report given to RN and Post -op Vital signs reviewed and stable  Post vital signs: Reviewed and stable  Last Vitals:  Vitals:   01/25/18 0548 01/25/18 1000  BP: (!) 137/59 117/63  Pulse: (!) 50 (!) 58  Resp: 20 14  Temp: 36.5 C (!) 36.3 C  SpO2: 96% 98%    Last Pain:  Vitals:   01/25/18 1000  TempSrc:   PainSc: 0-No pain      Patients Stated Pain Goal: 2 (01/25/18 0606)  Complications: No apparent anesthesia complications

## 2018-01-25 NOTE — Anesthesia Postprocedure Evaluation (Signed)
Anesthesia Post Note  Patient: Warren Baker  Procedure(s) Performed: BILATERAL TURBINATE REDUCTION (Bilateral Nose) BILATERAL MAXILLARY ANTROSTOMY (Bilateral Nose) ENDOSCOPIC SINUS SURGERY WITH NAVIGATION (Bilateral Nose)     Patient location during evaluation: PACU Anesthesia Type: General Level of consciousness: awake and alert Pain management: pain level controlled Vital Signs Assessment: post-procedure vital signs reviewed and stable Respiratory status: spontaneous breathing, nonlabored ventilation, respiratory function stable and patient connected to nasal cannula oxygen Cardiovascular status: blood pressure returned to baseline and stable Postop Assessment: no apparent nausea or vomiting Anesthetic complications: no    Last Vitals:  Vitals:   01/25/18 1130 01/25/18 1156  BP: 132/74 132/72  Pulse: (!) 51 (!) 53  Resp: 16 16  Temp: 36.6 C 36.6 C  SpO2: 98% 97%    Last Pain:  Vitals:   01/25/18 1217  TempSrc:   PainSc: 0-No pain                 Kennieth RadFitzgerald, Candy Leverett E

## 2018-01-26 ENCOUNTER — Encounter (HOSPITAL_COMMUNITY): Payer: Self-pay | Admitting: Otolaryngology

## 2018-01-26 DIAGNOSIS — I10 Essential (primary) hypertension: Secondary | ICD-10-CM | POA: Diagnosis not present

## 2018-01-26 DIAGNOSIS — J449 Chronic obstructive pulmonary disease, unspecified: Secondary | ICD-10-CM | POA: Diagnosis not present

## 2018-01-26 DIAGNOSIS — J320 Chronic maxillary sinusitis: Secondary | ICD-10-CM | POA: Diagnosis not present

## 2018-01-26 DIAGNOSIS — Z7982 Long term (current) use of aspirin: Secondary | ICD-10-CM | POA: Diagnosis not present

## 2018-01-26 DIAGNOSIS — G4733 Obstructive sleep apnea (adult) (pediatric): Secondary | ICD-10-CM | POA: Diagnosis not present

## 2018-01-26 DIAGNOSIS — J343 Hypertrophy of nasal turbinates: Secondary | ICD-10-CM | POA: Diagnosis not present

## 2018-01-26 DIAGNOSIS — K219 Gastro-esophageal reflux disease without esophagitis: Secondary | ICD-10-CM | POA: Diagnosis not present

## 2018-01-26 DIAGNOSIS — Z6841 Body Mass Index (BMI) 40.0 and over, adult: Secondary | ICD-10-CM | POA: Diagnosis not present

## 2018-01-26 NOTE — Discharge Summary (Signed)
01/26/2018 9:12 AM  Warren Baker, Warren Baker [id number]  Post-Op Day 1    Temp:  [97.3 F (36.3 C)-98.8 F (37.1 C)] 97.8 F (36.6 C) (03/09 0415) Pulse Rate:  [50-64] 64 (03/09 0415) Resp:  [12-16] 16 (03/09 0415) BP: (109-139)/(61-74) 139/62 (03/09 0415) SpO2:  [95 %-98 %] 96 % (03/09 0415),     Intake/Output Summary (Last 24 hours) at 01/26/2018 0912 Last data filed at 01/26/2018 0400 Gross per 24 hour  Intake 1802.5 ml  Output 25 ml  Net 1777.5 ml    No results found for this or any previous visit (from the past 24 hour(s)).  SUBJECTIVE:  Min pain.  Some abd discomfort, possibly from  Coughing.  Vision OK  OBJECTIVE:  Drip pad dry.  Vision intact OU  IMPRESSION:  Satisfactory check  PLAN:  Discharge to home and care of family  Admit:  8 MAR Discharge:  9 MAR Final Diagnosis:  Chronic maxillary sinusitis bilateral.  Bilateral hypertrophic inferior turbinates.  OSA Proc:  Bilat endoscopic antrostomy, bilateral SMR turbinates Comp:  None Cond:  Ambulatory.  spont void.   Recheck: 5 days Rx:  Hydrocodone, Keflex Instructions written and given  Hosp Course:  Underwent surgery without difficulty.  Observed 23 hr overnight given known OSA.  Min pain.  Min bleeding.  spont void.  Taking po liquids and solids. O2 sats stable.   Discharged AM  POD 1.    Flo ShanksWOLICKI, Mailee Klaas

## 2018-01-26 NOTE — Progress Notes (Signed)
Pt discharged home in stable condition after going over discharge teaching with no concerns voiced. AVS and discharge scripts given before leaving unit 

## 2018-02-01 DIAGNOSIS — R0981 Nasal congestion: Secondary | ICD-10-CM | POA: Diagnosis not present

## 2018-02-01 DIAGNOSIS — J320 Chronic maxillary sinusitis: Secondary | ICD-10-CM | POA: Diagnosis not present

## 2018-02-03 DIAGNOSIS — G4733 Obstructive sleep apnea (adult) (pediatric): Secondary | ICD-10-CM | POA: Diagnosis not present

## 2018-02-05 ENCOUNTER — Telehealth: Payer: Self-pay | Admitting: Family Medicine

## 2018-02-05 NOTE — Telephone Encounter (Signed)
Copied from CRM [id number]. Topic: Quick Communication - Rx Refill/Question >> Feb 05, 2018  3:45 PM Avie ArenasSimmons, Vishnu Moeller L, NT wrote: Medication:  albutero l PROVENTIL  5 MG/ML 0.5% nebulizer solution  Has the patient contacted their pharmacy? yes  (Agent: If no, request that the patient contact the pharmacy for the refill. [facility]with phone number or [facility] [id number] [address] [place] - [id number] [phone] Phone [phone] Fax Agent: Please be advised that RX refills may take up to 3 business days. We ask that you follow-up with your pharmacy.

## 2018-02-06 ENCOUNTER — Other Ambulatory Visit: Payer: Self-pay | Admitting: Family Medicine

## 2018-02-06 ENCOUNTER — Telehealth: Payer: Self-pay | Admitting: Family Medicine

## 2018-02-06 DIAGNOSIS — J189 Pneumonia, unspecified organism: Secondary | ICD-10-CM | POA: Diagnosis not present

## 2018-02-06 DIAGNOSIS — G4733 Obstructive sleep apnea (adult) (pediatric): Secondary | ICD-10-CM | POA: Diagnosis not present

## 2018-02-06 DIAGNOSIS — J410 Simple chronic bronchitis: Secondary | ICD-10-CM

## 2018-02-06 DIAGNOSIS — J441 Chronic obstructive pulmonary disease with (acute) exacerbation: Secondary | ICD-10-CM | POA: Diagnosis not present

## 2018-02-06 MED ORDER — FLUTICASONE PROPIONATE HFA 110 MCG/ACT IN AERO: 1.0000 | INHALATION_SPRAY | Freq: Two times a day (BID) | RESPIRATORY_TRACT | 0 refills | Status: DC

## 2018-02-06 MED ORDER — ALBUTEROL SULFATE (5 MG/ML) 0.5% IN NEBU: 2.5000 mg | INHALATION_SOLUTION | Freq: Four times a day (QID) | RESPIRATORY_TRACT | 0 refills | Status: AC | PRN

## 2018-02-06 NOTE — Telephone Encounter (Signed)
Verified nebulizer orders with Melissa. She will fill for the vials of medication.

## 2018-02-06 NOTE — Telephone Encounter (Signed)
Copied from CRM [id number]. Topic: Quick Communication - See Telephone Encounter >> Feb 06, 2018 11:25 AM Guinevere FerrariMorris, Cordarrell Sane E, NT wrote: CRM for notification. See Telephone encounter for: Warren KaufmannMelissa is calling to verify the drug albuterol (PROVENTIL) (5 MG/ML) 0.5% nebulizer solution and would like a call back. She would like a call back at [phone]  02/06/18.

## 2018-02-27 DIAGNOSIS — H938X3 Other specified disorders of ear, bilateral: Secondary | ICD-10-CM | POA: Diagnosis not present

## 2018-02-27 DIAGNOSIS — J320 Chronic maxillary sinusitis: Secondary | ICD-10-CM | POA: Diagnosis not present

## 2018-02-27 DIAGNOSIS — R0981 Nasal congestion: Secondary | ICD-10-CM | POA: Diagnosis not present

## 2018-02-27 DIAGNOSIS — Z87891 Personal history of nicotine dependence: Secondary | ICD-10-CM | POA: Diagnosis not present

## 2018-02-27 DIAGNOSIS — Z7289 Other problems related to lifestyle: Secondary | ICD-10-CM | POA: Diagnosis not present

## 2018-03-06 DIAGNOSIS — G4733 Obstructive sleep apnea (adult) (pediatric): Secondary | ICD-10-CM | POA: Diagnosis not present

## 2018-03-09 DIAGNOSIS — G4733 Obstructive sleep apnea (adult) (pediatric): Secondary | ICD-10-CM | POA: Diagnosis not present

## 2018-03-09 DIAGNOSIS — J189 Pneumonia, unspecified organism: Secondary | ICD-10-CM | POA: Diagnosis not present

## 2018-03-09 DIAGNOSIS — J441 Chronic obstructive pulmonary disease with (acute) exacerbation: Secondary | ICD-10-CM | POA: Diagnosis not present

## 2018-04-05 DIAGNOSIS — G4733 Obstructive sleep apnea (adult) (pediatric): Secondary | ICD-10-CM | POA: Diagnosis not present

## 2018-04-08 DIAGNOSIS — J441 Chronic obstructive pulmonary disease with (acute) exacerbation: Secondary | ICD-10-CM | POA: Diagnosis not present

## 2018-04-08 DIAGNOSIS — G4733 Obstructive sleep apnea (adult) (pediatric): Secondary | ICD-10-CM | POA: Diagnosis not present

## 2018-04-08 DIAGNOSIS — J189 Pneumonia, unspecified organism: Secondary | ICD-10-CM | POA: Diagnosis not present

## 2018-04-20 ENCOUNTER — Other Ambulatory Visit: Payer: Self-pay | Admitting: Physician Assistant

## 2018-04-25 ENCOUNTER — Other Ambulatory Visit: Payer: Self-pay | Admitting: Physician Assistant

## 2018-05-06 DIAGNOSIS — G4733 Obstructive sleep apnea (adult) (pediatric): Secondary | ICD-10-CM | POA: Diagnosis not present

## 2018-05-09 DIAGNOSIS — J189 Pneumonia, unspecified organism: Secondary | ICD-10-CM | POA: Diagnosis not present

## 2018-05-09 DIAGNOSIS — G4733 Obstructive sleep apnea (adult) (pediatric): Secondary | ICD-10-CM | POA: Diagnosis not present

## 2018-05-09 DIAGNOSIS — J441 Chronic obstructive pulmonary disease with (acute) exacerbation: Secondary | ICD-10-CM | POA: Diagnosis not present

## 2018-05-15 ENCOUNTER — Encounter: Payer: Self-pay | Admitting: Family Medicine

## 2018-05-15 ENCOUNTER — Ambulatory Visit (INDEPENDENT_AMBULATORY_CARE_PROVIDER_SITE_OTHER): Payer: Medicare HMO | Admitting: Family Medicine

## 2018-05-15 VITALS — BP 120/62 | HR 47 | Temp 97.7°F | Ht 76.0 in | Wt 317.2 lb

## 2018-05-15 DIAGNOSIS — E785 Hyperlipidemia, unspecified: Secondary | ICD-10-CM | POA: Diagnosis not present

## 2018-05-15 DIAGNOSIS — J42 Unspecified chronic bronchitis: Secondary | ICD-10-CM | POA: Diagnosis not present

## 2018-05-15 NOTE — Progress Notes (Signed)
Pre visit review using our clinic review tool, if applicable. No additional management support is needed unless otherwise documented below in the visit note. 

## 2018-05-15 NOTE — Progress Notes (Signed)
Chief Complaint  Patient presents with  . Follow-up    sinus congestion    Subjective: Hyperlipidemia Patient presents for Hyperlipidemia follow up. Currently taking Lipitor and compliance with treatment thus far has been good. He denies myalgias. He is not adhering to a healthy diet. The patient exercises never and is physically active at work.  The patient is not known to have coexisting coronary artery disease.  COPD Uses albuterol inhaler around 2-3 times per week. Uses Flovent for maintenance, allergies make it worse.   ROS: Heart: Denies chest pain or palpitations Lungs: Denies SOB or cough  Past Medical History:  Diagnosis Date  . Arthritis    "legs, back, shoulders" (01/25/2018)  . Broken leg   . Chronic back pain    "misalligned discs" (01/25/2018)  . COPD (chronic obstructive pulmonary disease) (HCC)   . Excessive daytime sleepiness 07/13/2016  . Family history of adverse reaction to anesthesia    "think my mom had a hard time waking up one time" (01/25/2018)  . GERD (gastroesophageal reflux disease)   . High cholesterol   . History of chicken pox   . History of kidney stones   . 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 OSA on CPAP 11/01/2016   Mild with AHI 11/hr  . Seasonal allergies   . Tremors of nervous system    Objective: BP 120/62 (BP Location: Left Arm, Patient Position: Sitting, Cuff Size: Large)   Pulse (!) 47   Temp 97.7 F (36.5 C) (Oral)   Ht 6\' 4"  (1.93 m)   Wt (!) 317 lb 4 oz (143.9 kg)   SpO2 94%   BMI 38.62 kg/m  General: Awake, appears stated age HEENT: MMM, EOMi Heart: RRR, no murmurs, no bruits Lungs: CTAB, no rales, wheezes or rhonchi. No accessory muscle use Psych: Age appropriate judgment and insight, normal affect and mood  Assessment and Plan: Hyperlipidemia, unspecified hyperlipidemia type - Plan: Comprehensive metabolic panel, Lipid panel  Chronic  bronchitis, unspecified chronic bronchitis type (HCC)  Orders as above. Counseled on diet and exercise.  Needs to take Flovent daily if using albuterol >2x/week. Rinse mouth out. F/u 6 mo or prn, CPE. The patient voiced understanding and agreement to the plan.  Jilda Rocheicholas Paul BurkeWendling, DO 05/15/18  2:28 PM

## 2018-05-15 NOTE — Patient Instructions (Signed)
Use the Flovent daily for now. You will realize certain times of the year are worse for your breathing than others. That is the time to use it. Remember to rinse your mouth out after using it.  Aim to do some physical exertion for 150 minutes per week. This is typically divided into 5 days per week, 30 minutes per day. The activity should be enough to get your heart rate up. Anything is better than nothing if you have time constraints.  Keep the diet clean.  Let us know if you need anything.

## 2018-05-16 LAB — COMPREHENSIVE METABOLIC PANEL
ALBUMIN: 4.2 g/dL (ref 3.5–5.2)
ALK PHOS: 48 U/L (ref 39–117)
ALT: 20 U/L (ref 0–53)
AST: 25 U/L (ref 0–37)
BUN: 14 mg/dL (ref 6–23)
CHLORIDE: 99 meq/L (ref 96–112)
CO2: 35 mEq/L — ABNORMAL HIGH (ref 19–32)
CREATININE: 1.01 mg/dL (ref 0.40–1.50)
Calcium: 9.6 mg/dL (ref 8.4–10.5)
GFR: 78.45 mL/min (ref >60.00)
Glucose, Bld: 96 mg/dL (ref 70–99)
POTASSIUM: 4.8 meq/L (ref 3.5–5.1)
Sodium: 138 mEq/L (ref 135–145)
TOTAL PROTEIN: 7 g/dL (ref 6.0–8.3)
Total Bilirubin: 0.5 mg/dL (ref 0.2–1.2)

## 2018-05-16 LAB — LIPID PANEL
CHOLESTEROL: 131 mg/dL (ref 0–200)
HDL: 33.8 mg/dL — ABNORMAL LOW (ref >39.00)
LDL CALC: 69 mg/dL (ref 0–99)
NonHDL: 97.4
TRIGLYCERIDES: 144 mg/dL (ref 0.0–149.0)
Total CHOL/HDL Ratio: 4
VLDL: 28.8 mg/dL (ref 0.0–40.0)

## 2018-05-18 IMAGING — DX DG CHEST 2V
2 series · 2 of 2 positions shown · non-contrast
Comparison: August 15, 2016

CLINICAL DATA: Shortness of breath and wheezing

EXAM:
CHEST  2 VIEW

[chest pa]
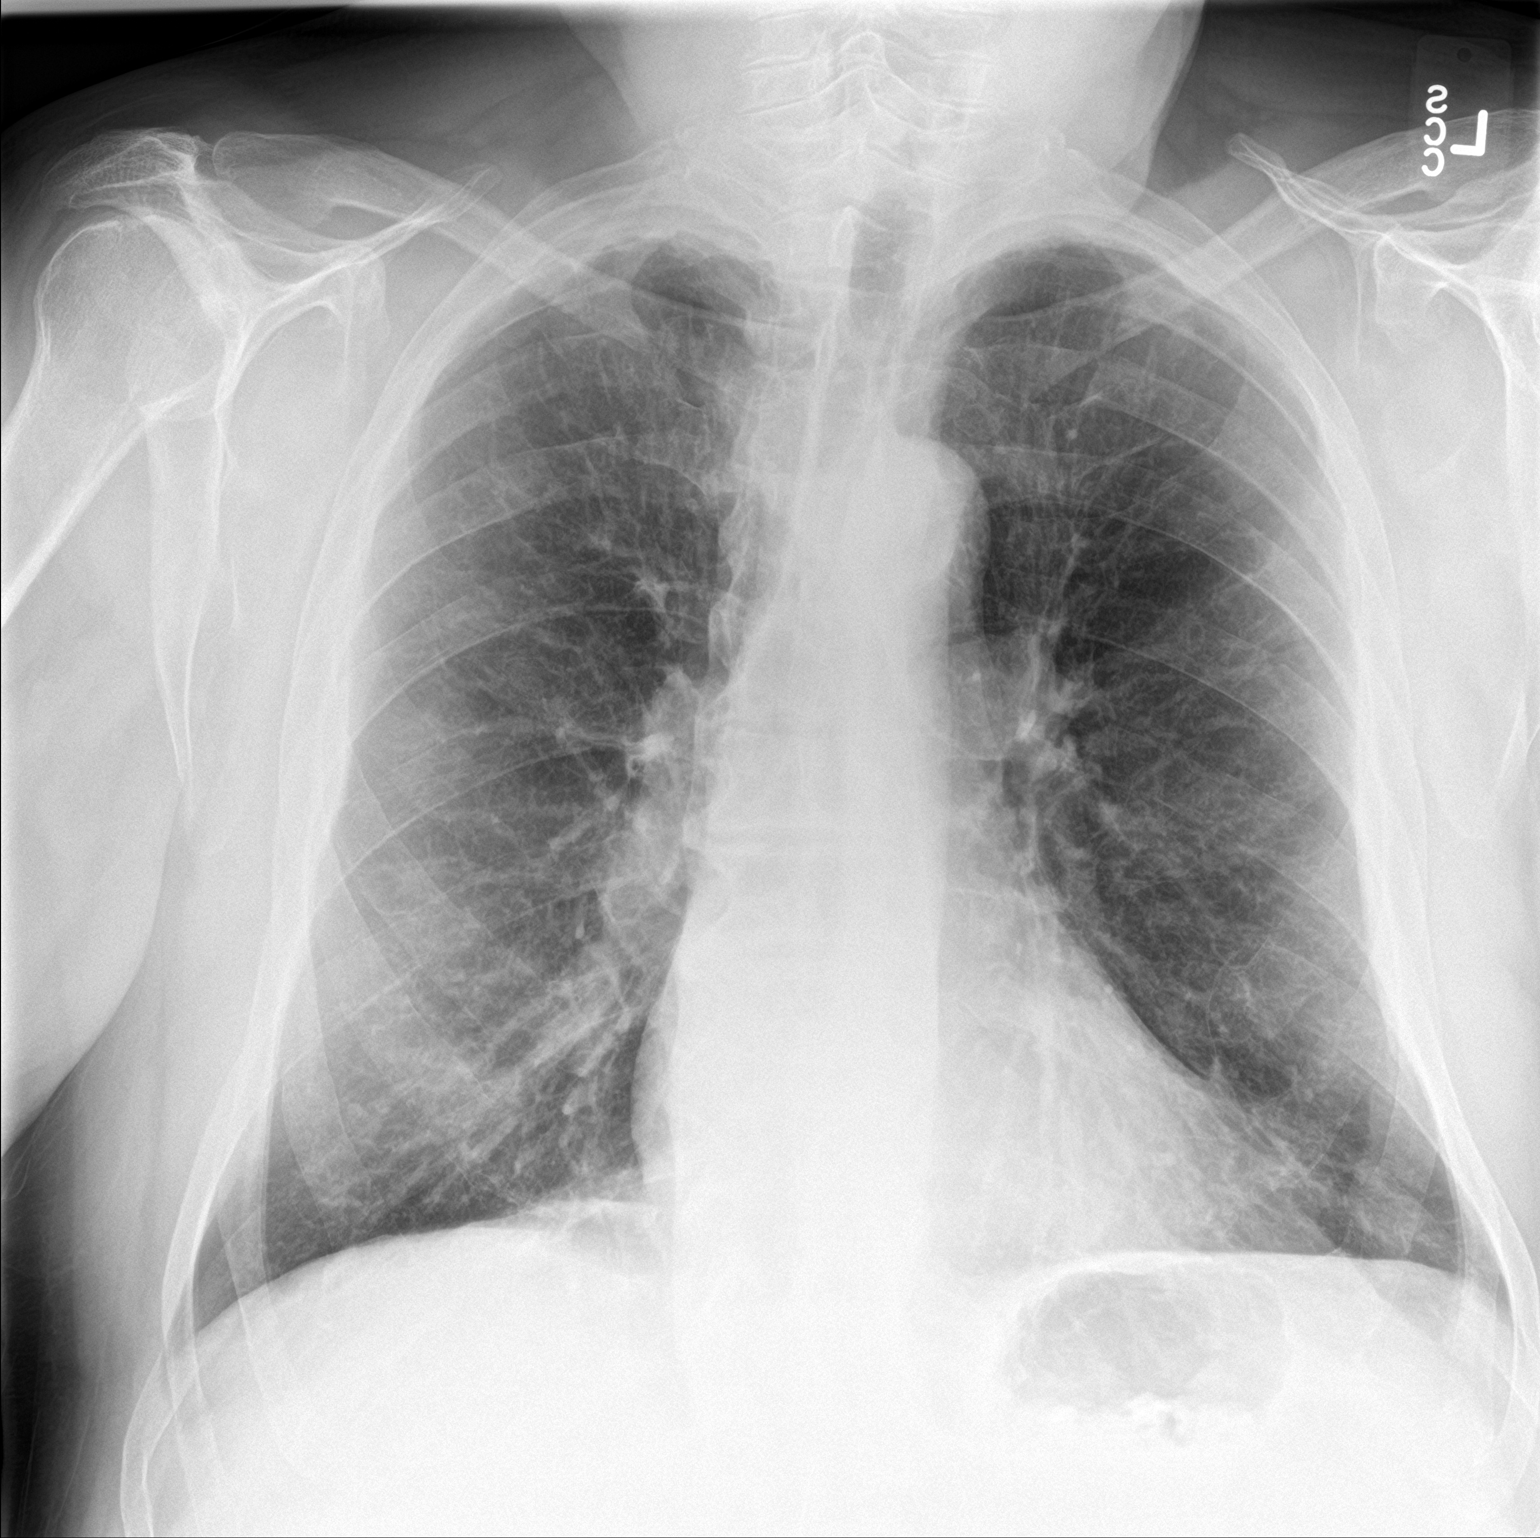

[chest lat]
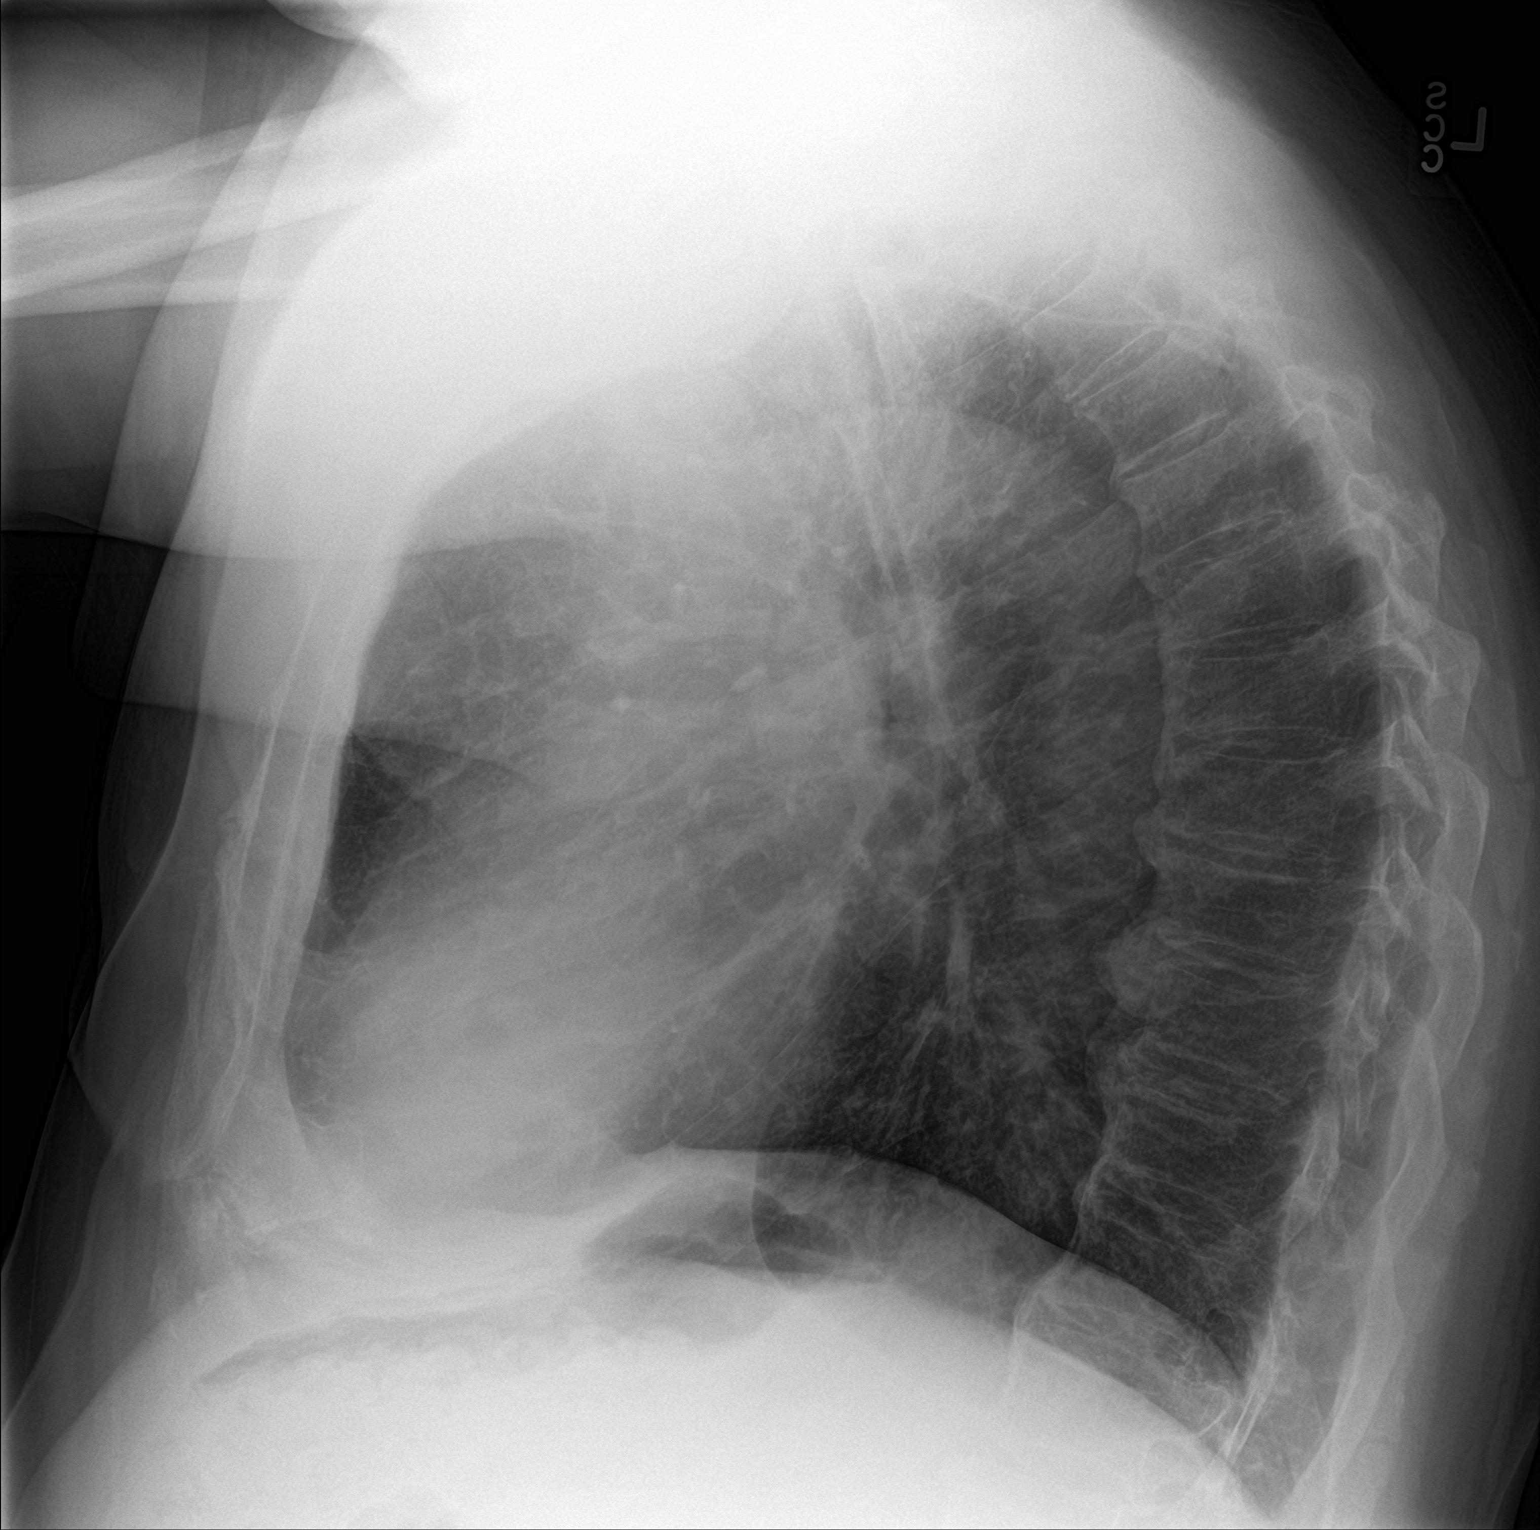

[2 of 2 positions shown; findings below may reference images not displayed]

FINDINGS: Lungs are somewhat hyperexpanded. There is no edema or
consolidation. Heart size and pulmonary vascularity are normal. No
adenopathy. There is degenerative change in the thoracic spine.
IMPRESSION: Lungs mildly hyperexpanded without edema or consolidation. Stable
cardiac silhouette.

## 2018-06-04 IMAGING — DX DG CHEST 2V
2 series · 2 of 2 positions shown · non-contrast
Comparison: 07/07/2017

CLINICAL DATA: COPD exacerbation.

EXAM:
CHEST  2 VIEW

[chest pa]
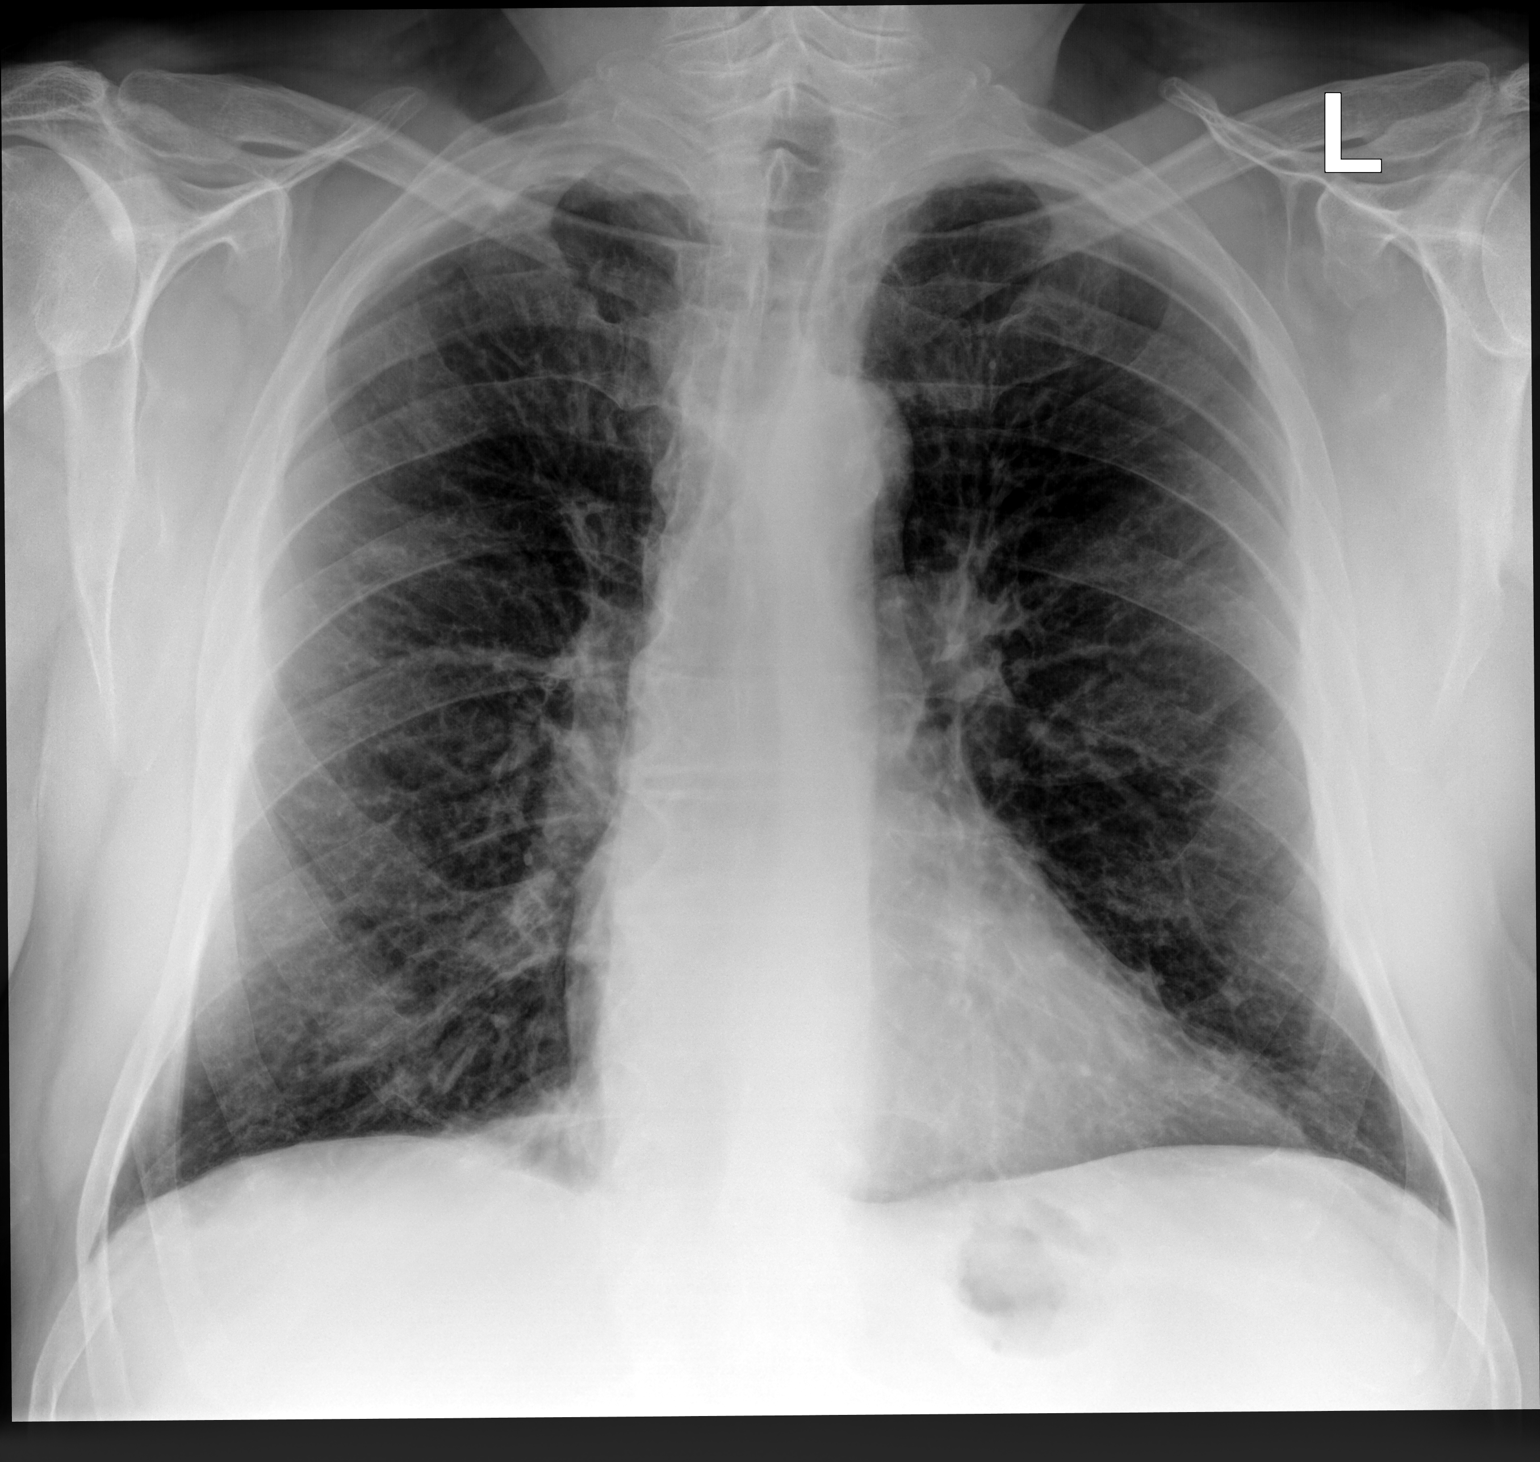

[chest lat]
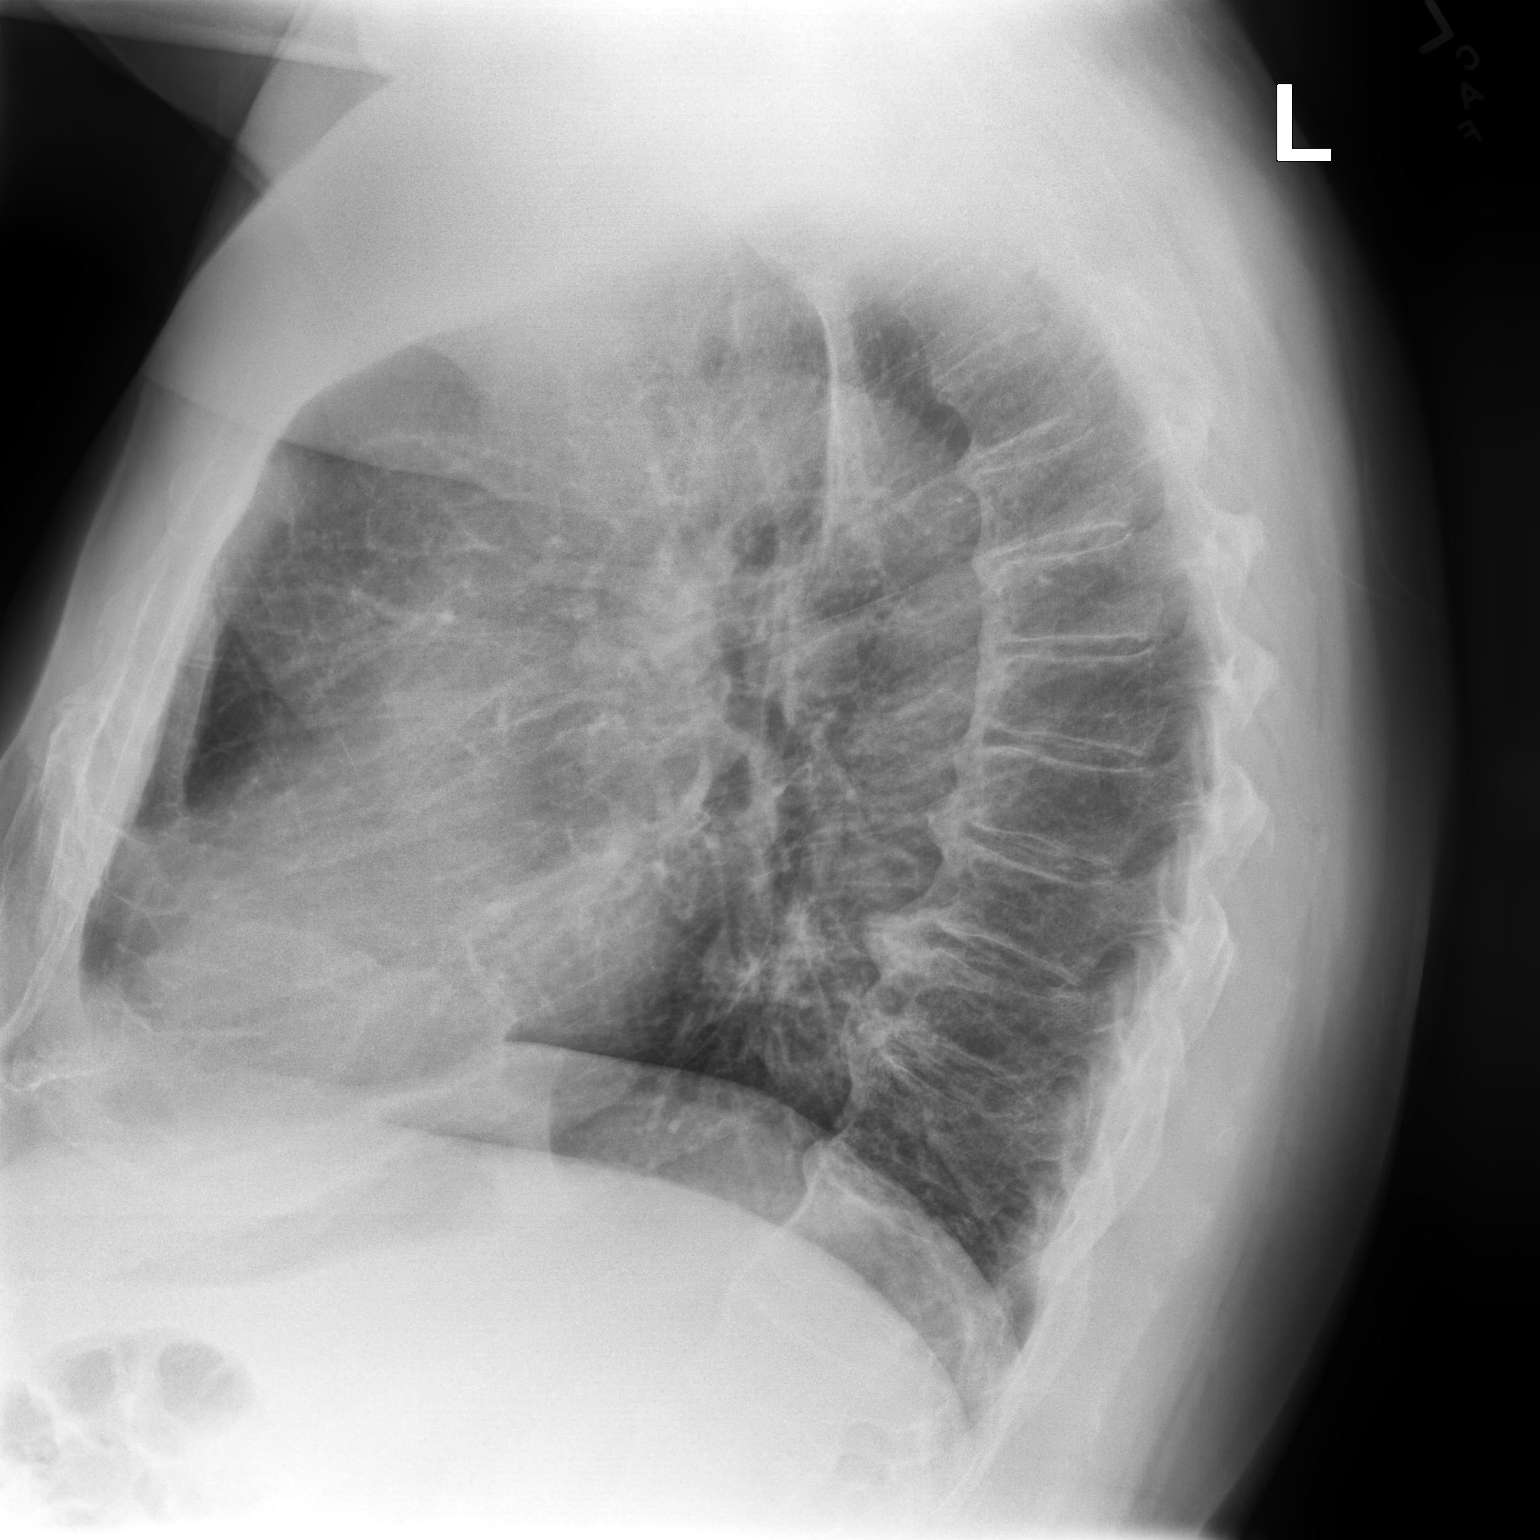

[2 of 2 positions shown; findings below may reference images not displayed]

FINDINGS: Hyperinflation and bronchitic markings. There is no edema,
consolidation, effusion, or pneumothorax. Normal heart size when
accounting for apical fat pad. Negative aortic and hilar contours.
Spondylosis.
IMPRESSION: COPD without evidence of acute superimposed disease.

## 2018-06-05 DIAGNOSIS — G4733 Obstructive sleep apnea (adult) (pediatric): Secondary | ICD-10-CM | POA: Diagnosis not present

## 2018-06-08 DIAGNOSIS — J441 Chronic obstructive pulmonary disease with (acute) exacerbation: Secondary | ICD-10-CM | POA: Diagnosis not present

## 2018-06-08 DIAGNOSIS — G4733 Obstructive sleep apnea (adult) (pediatric): Secondary | ICD-10-CM | POA: Diagnosis not present

## 2018-06-08 DIAGNOSIS — J189 Pneumonia, unspecified organism: Secondary | ICD-10-CM | POA: Diagnosis not present

## 2018-06-14 ENCOUNTER — Other Ambulatory Visit: Payer: Self-pay | Admitting: Family Medicine

## 2018-06-14 DIAGNOSIS — K219 Gastro-esophageal reflux disease without esophagitis: Secondary | ICD-10-CM

## 2018-06-14 MED ORDER — OMEPRAZOLE 20 MG PO CPDR: 20.0000 mg | DELAYED_RELEASE_CAPSULE | Freq: Every day | ORAL | 0 refills | Status: DC

## 2018-06-19 ENCOUNTER — Other Ambulatory Visit: Payer: Self-pay | Admitting: Family Medicine

## 2018-06-19 DIAGNOSIS — E785 Hyperlipidemia, unspecified: Secondary | ICD-10-CM

## 2018-06-19 MED ORDER — ATORVASTATIN CALCIUM 10 MG PO TABS: 10.0000 mg | ORAL_TABLET | Freq: Every day | ORAL | 1 refills | Status: DC

## 2018-06-19 MED ORDER — LEVOCETIRIZINE DIHYDROCHLORIDE 5 MG PO TABS: ORAL_TABLET | ORAL | 1 refills | Status: DC

## 2018-06-28 DIAGNOSIS — G4733 Obstructive sleep apnea (adult) (pediatric): Secondary | ICD-10-CM | POA: Diagnosis not present

## 2018-07-06 DIAGNOSIS — G4733 Obstructive sleep apnea (adult) (pediatric): Secondary | ICD-10-CM | POA: Diagnosis not present

## 2018-07-09 DIAGNOSIS — J189 Pneumonia, unspecified organism: Secondary | ICD-10-CM | POA: Diagnosis not present

## 2018-07-09 DIAGNOSIS — J441 Chronic obstructive pulmonary disease with (acute) exacerbation: Secondary | ICD-10-CM | POA: Diagnosis not present

## 2018-07-09 DIAGNOSIS — G4733 Obstructive sleep apnea (adult) (pediatric): Secondary | ICD-10-CM | POA: Diagnosis not present

## 2018-08-06 DIAGNOSIS — G4733 Obstructive sleep apnea (adult) (pediatric): Secondary | ICD-10-CM | POA: Diagnosis not present

## 2018-08-21 ENCOUNTER — Other Ambulatory Visit: Payer: Self-pay | Admitting: Family Medicine

## 2018-08-21 DIAGNOSIS — K219 Gastro-esophageal reflux disease without esophagitis: Secondary | ICD-10-CM

## 2018-08-27 ENCOUNTER — Other Ambulatory Visit: Payer: Self-pay | Admitting: Physician Assistant

## 2018-08-27 DIAGNOSIS — E785 Hyperlipidemia, unspecified: Secondary | ICD-10-CM

## 2018-08-28 ENCOUNTER — Ambulatory Visit (INDEPENDENT_AMBULATORY_CARE_PROVIDER_SITE_OTHER): Payer: Medicare HMO | Admitting: Family Medicine

## 2018-08-28 ENCOUNTER — Encounter: Payer: Self-pay | Admitting: Family Medicine

## 2018-08-28 VITALS — BP 140/80 | HR 65 | Temp 100.0°F | Ht 76.0 in | Wt 318.4 lb

## 2018-08-28 DIAGNOSIS — R251 Tremor, unspecified: Secondary | ICD-10-CM | POA: Diagnosis not present

## 2018-08-28 DIAGNOSIS — J0100 Acute maxillary sinusitis, unspecified: Secondary | ICD-10-CM

## 2018-08-28 LAB — POCT INFLUENZA A/B
INFLUENZA B, POC: NEGATIVE
Influenza A, POC: NEGATIVE

## 2018-08-28 MED ORDER — DOXYCYCLINE HYCLATE 100 MG PO TABS: 100.0000 mg | ORAL_TABLET | Freq: Two times a day (BID) | ORAL | 0 refills | Status: AC

## 2018-08-28 MED ORDER — METHYLPREDNISOLONE ACETATE 80 MG/ML IJ SUSP
80.0000 mg | Freq: Once | INTRAMUSCULAR | Status: AC
  Administered 2018-08-28: 80 mg via INTRAMUSCULAR

## 2018-08-28 MED ORDER — PROPRANOLOL HCL 40 MG PO TABS: ORAL_TABLET | ORAL | 3 refills | Status: DC

## 2018-08-28 NOTE — Patient Instructions (Signed)
Continue to push fluids, practice good hand hygiene, and cover your mouth if you cough.  If you start having increasing fevers, shaking or shortness of breath, seek immediate care.  OK to take Tylenol 1000 mg (2 extra strength tabs) or 975 mg (3 regular strength tabs) every 6 hours as needed.  Ibuprofen 400-600 mg (2-3 over the counter strength tabs) every 6 hours as needed for pain.  For symptoms, consider using Vick's VapoRub on chest or under nose, air humidifier, Benadryl at night, and elevating the head of the bed. Tylenol and ibuprofen for aches and pains you may be experiencing.   Let us know if you need anything.   

## 2018-08-28 NOTE — Progress Notes (Signed)
Pre visit review using our clinic review tool, if applicable. No additional management support is needed unless otherwise documented below in the visit note. 

## 2018-08-28 NOTE — Progress Notes (Signed)
Chief Complaint  Patient presents with  . Sinusitis  . Chills  . Fever  . Generalized Body Aches    Warren Baker here for URI complaints.  Duration: 1 week  Associated symptoms: subjective fever, sinus congestion, sinus pain, rhinorrhea, sore throat, wheezing, shortness of breath, myalgia and cough Denies: itchy watery eyes, dental pain, and ear drainage Treatment to date: ASA, cold meds otc, Nyquil Sick contacts: No  ROS:  Const: +fevers HEENT: As noted in HPI Lungs: +SOB  Past Medical History:  Diagnosis Date  . Arthritis    "legs, back, shoulders" (01/25/2018)  . Broken leg   . Chronic back pain    "misalligned discs" (01/25/2018)  . COPD (chronic obstructive pulmonary disease) (HCC)   . Excessive daytime sleepiness 07/13/2016  . Family history of adverse reaction to anesthesia    "think my mom had a hard time waking up one time" (01/25/2018)  . GERD (gastroesophageal reflux disease)   . High cholesterol   . History of chicken pox   . History of kidney stones   . 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 OSA on CPAP 11/01/2016   Mild with AHI 11/hr  . Seasonal allergies   . Tremors of nervous system     BP 140/80 (BP Location: Left Arm, Patient Position: Sitting, Cuff Size: Large)   Pulse 65   Temp 100 F (37.8 C) (Oral)   Ht 6\' 4"  (1.93 m)   Wt (!) 318 lb 6 oz (144.4 kg)   SpO2 96%   BMI 38.75 kg/m  General: Awake, alert, appears stated age HEENT: AT, [place], ears patent b/l and TM's neg, nares patent w/o discharge, max sinuses ttp b/l, pharynx pink and without exudates, MMM Neck: No masses or asymmetry Heart: RRR Lungs: CTAB, no accessory muscle use Psych: Age appropriate judgment and insight, normal mood and affect  Acute maxillary sinusitis, recurrence not specified - Plan: doxycycline (VIBRA-TABS) 100 MG tablet  Tremor - Plan: propranolol (INDERAL) 40 MG tablet  Orders as above. Steroid  inj today also.  Continue to push fluids, practice good hand hygiene, cover mouth when coughing. Tx w abx given fever and he recently took antipyretic.  Letter for work clearing him back on Mon given. F/u prn. If starting to experience fevers, shaking, or shortness of breath, seek immediate care. Pt voiced understanding and agreement to the plan.  Jilda Roche Tindall, DO 08/28/18 3:41 PM

## 2018-09-05 DIAGNOSIS — G4733 Obstructive sleep apnea (adult) (pediatric): Secondary | ICD-10-CM | POA: Diagnosis not present

## 2018-09-13 ENCOUNTER — Encounter: Payer: Self-pay | Admitting: Family Medicine

## 2018-09-13 ENCOUNTER — Ambulatory Visit (INDEPENDENT_AMBULATORY_CARE_PROVIDER_SITE_OTHER): Payer: Medicare HMO | Admitting: Family Medicine

## 2018-09-13 VITALS — BP 120/72 | HR 54 | Temp 98.6°F | Ht 76.0 in | Wt 315.5 lb

## 2018-09-13 DIAGNOSIS — J40 Bronchitis, not specified as acute or chronic: Secondary | ICD-10-CM | POA: Diagnosis not present

## 2018-09-13 MED ORDER — PREDNISONE 20 MG PO TABS: 40.0000 mg | ORAL_TABLET | Freq: Every day | ORAL | 0 refills | Status: AC

## 2018-09-13 MED ORDER — ALBUTEROL SULFATE HFA 108 (90 BASE) MCG/ACT IN AERS: 2.0000 | INHALATION_SPRAY | Freq: Four times a day (QID) | RESPIRATORY_TRACT | 0 refills | Status: DC | PRN

## 2018-09-13 NOTE — Progress Notes (Signed)
Chief Complaint  Patient presents with  . Cough    congestion    Warren Baker here for URI complaints.  Duration: 1 week  Associated symptoms: sinus headache, sinus congestion, sinus pain, ear fullness, sore throat, wheezing, shortness of breath, myalgia and cough Denies: itchy watery eyes, ear pain, ear drainage and fevers Treatment to date: Cold meds, Nyquil, received steroid injection on 10/9 and finished doxy a week ago, Mucinex,  Sick contacts: No  ROS:  Const: Denies fevers HEENT: As noted in HPI Lungs:+SOB  Past Medical History:  Diagnosis Date  . Arthritis    "legs, back, shoulders" (01/25/2018)  . Broken leg   . Chronic back pain    "misalligned discs" (01/25/2018)  . COPD (chronic obstructive pulmonary disease) (HCC)   . Excessive daytime sleepiness 07/13/2016  . Family history of adverse reaction to anesthesia    "think my mom had a hard time waking up one time" (01/25/2018)  . GERD (gastroesophageal reflux disease)   . High cholesterol   . History of chicken pox   . History of kidney stones   . 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 OSA on CPAP 11/01/2016   Mild with AHI 11/hr  . Seasonal allergies   . Tremors of nervous system     BP 120/72 (BP Location: Left Arm, Patient Position: Sitting, Cuff Size: Large)   Pulse (!) 54   Temp 98.6 F (37 C) (Oral)   Ht 6\' 4"  (1.93 m)   Wt (!) 315 lb 8 oz (143.1 kg)   SpO2 93%   BMI 38.40 kg/m  General: Awake, alert, appears stated age HEENT: AT, [place], ears patent b/l and TM's neg, nares patent w/o discharge, pharynx pink and without exudates, MMM Neck: No masses or asymmetry Heart: RRR Lungs: +diffuse wheezing, no accessory muscle use Psych: Age appropriate judgment and insight, normal mood and affect  Wheezy bronchitis - Plan: predniSONE (DELTASONE) 20 MG tablet, albuterol (PROVENTIL HFA;VENTOLIN HFA) 108 (90 Base) MCG/ACT inhaler  Orders as  above. Continue to push fluids, practice good hand hygiene, cover mouth when coughing. F/u prn. If starting to experience fevers, shaking, or shortness of breath, seek immediate care. Pt voiced understanding and agreement to the plan.  Jilda Roche [place]ora, DO 09/13/18 2:27 PM

## 2018-09-13 NOTE — Progress Notes (Signed)
Pre visit review using our clinic review tool, if applicable. No additional management support is needed unless otherwise documented below in the visit note. 

## 2018-09-13 NOTE — Patient Instructions (Signed)
Continue to push fluids, practice good hand hygiene, and cover your mouth if you cough.  If you start having fevers, shaking or shortness of breath, seek immediate care.  Use the albuterol inhaler as needed.  Let us know if you need anything.

## 2018-10-20 ENCOUNTER — Encounter

## 2018-10-25 ENCOUNTER — Other Ambulatory Visit: Payer: Self-pay | Admitting: Physician Assistant

## 2018-11-01 ENCOUNTER — Other Ambulatory Visit: Payer: Self-pay | Admitting: Family Medicine

## 2018-11-01 DIAGNOSIS — K219 Gastro-esophageal reflux disease without esophagitis: Secondary | ICD-10-CM

## 2018-11-01 MED ORDER — OMEPRAZOLE 20 MG PO CPDR: 20.0000 mg | DELAYED_RELEASE_CAPSULE | Freq: Every day | ORAL | 2 refills | Status: DC

## 2018-11-27 ENCOUNTER — Other Ambulatory Visit: Payer: Self-pay | Admitting: Family Medicine

## 2018-12-06 ENCOUNTER — Other Ambulatory Visit: Payer: Self-pay | Admitting: Family Medicine

## 2018-12-06 DIAGNOSIS — J40 Bronchitis, not specified as acute or chronic: Secondary | ICD-10-CM

## 2018-12-06 DIAGNOSIS — K219 Gastro-esophageal reflux disease without esophagitis: Secondary | ICD-10-CM

## 2018-12-06 DIAGNOSIS — R251 Tremor, unspecified: Secondary | ICD-10-CM

## 2018-12-06 DIAGNOSIS — E785 Hyperlipidemia, unspecified: Secondary | ICD-10-CM

## 2018-12-06 MED ORDER — TAMSULOSIN HCL 0.4 MG PO CAPS: ORAL_CAPSULE | ORAL | 1 refills | Status: DC

## 2018-12-06 MED ORDER — PROPRANOLOL HCL 40 MG PO TABS: ORAL_TABLET | ORAL | 3 refills | Status: DC

## 2018-12-06 MED ORDER — ALBUTEROL SULFATE HFA 108 (90 BASE) MCG/ACT IN AERS: 2.0000 | INHALATION_SPRAY | Freq: Four times a day (QID) | RESPIRATORY_TRACT | 0 refills | Status: DC | PRN

## 2018-12-06 MED ORDER — MONTELUKAST SODIUM 10 MG PO TABS: ORAL_TABLET | ORAL | 1 refills | Status: DC

## 2018-12-06 MED ORDER — ATORVASTATIN CALCIUM 10 MG PO TABS: 10.0000 mg | ORAL_TABLET | Freq: Every day | ORAL | 1 refills | Status: DC

## 2018-12-06 MED ORDER — OMEPRAZOLE 20 MG PO CPDR: 20.0000 mg | DELAYED_RELEASE_CAPSULE | Freq: Every day | ORAL | 2 refills | Status: DC

## 2018-12-06 MED ORDER — PRIMIDONE 50 MG PO TABS: 150.0000 mg | ORAL_TABLET | Freq: Two times a day (BID) | ORAL | 0 refills | Status: DC

## 2018-12-06 MED ORDER — LEVOCETIRIZINE DIHYDROCHLORIDE 5 MG PO TABS: 5.0000 mg | ORAL_TABLET | Freq: Every evening | ORAL | 1 refills | Status: DC

## 2018-12-08 NOTE — Telephone Encounter (Signed)
Can we verify freq? Ty.

## 2018-12-09 NOTE — Telephone Encounter (Signed)
Called the patient and did confirm he takes the 800 mg 5 times per day.

## 2018-12-09 NOTE — Telephone Encounter (Signed)
Who prescribed this in past, what exactly for, and why so much?

## 2018-12-09 NOTE — Telephone Encounter (Signed)
His neurologist from [facility] prescribed it and was to have gotten this refill He will contact that office/and [facility] to let them know where to send the Gabapentin request to.

## 2018-12-20 NOTE — Progress Notes (Deleted)
Subjective:   Warren Baker is a 68 y.o. male who presents for Medicare Annual/Subsequent preventive examination.  Review of Systems: No ROS.  Medicare Wellness Visit. Additional risk factors are reflected in the social history   Sleep patterns:  Home Safety/Smoke Alarms: Feels safe in home. Smoke alarms in place.   Male:   CCS-  Per care everywhere last 04/05/15 with 10 yr recall   PSA-  Lab Results  Component Value Date   PSA 1.68 05/31/2017   PSA 1.38 05/30/2016   PSA 1.84 10/04/2015       Objective:    Vitals: There were no vitals taken for this visit.  There is no height or weight on file to calculate BMI.  Advanced Directives 01/25/2018 01/25/2018 01/18/2018 07/25/2017 07/11/2017 07/08/2017 07/07/2017  Does Patient Have a Medical Advance Directive? - No No No No No No  Would patient like information on creating a medical advance directive? Yes (Inpatient - patient defers creating a medical advance directive at this time) No - Patient declined No - Patient declined No - Patient declined No - Patient declined No - Patient declined -    Tobacco Social History   Tobacco Use  Smoking Status Former Smoker  . Packs/day: 2.00  . Years: 30.00  . Pack years: 60.00  . Types: Cigarettes  . Last attempt to quit: 07/1999  . Years since quitting: 19.4  Smokeless Tobacco Former Neurosurgeon  . Types: Snuff, Chew     Counseling given: Not Answered   Clinical Intake:                       Past Medical History:  Diagnosis Date  . Arthritis    "legs, back, shoulders" (01/25/2018)  . Broken leg   . Chronic back pain    "misalligned discs" (01/25/2018)  . COPD (chronic obstructive pulmonary disease) (HCC)   . Excessive daytime sleepiness 07/13/2016  . Family history of adverse reaction to anesthesia    "think my mom had a hard time waking up one time" (01/25/2018)  . GERD (gastroesophageal reflux disease)   . High cholesterol   . History of chicken pox   . History of kidney  stones   . 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 OSA on CPAP 11/01/2016   Mild with AHI 11/hr  . Seasonal allergies   . Tremors of nervous system    Past Surgical History:  Procedure Laterality Date  . COLONOSCOPY    . COLONOSCOPY W/ BIOPSIES AND POLYPECTOMY  ~ 2016  . MAXILLARY ANTROSTOMY Bilateral 01/25/2018   Procedure: BILATERAL MAXILLARY ANTROSTOMY;  Surgeon: Graylin Shiver, MD;  Location: [facility];  Service: ENT;  Laterality: Bilateral;  . NASAL TURBINATE REDUCTION Bilateral 01/25/2018  . SINUS ENDO W/FUSION Bilateral 01/25/2018   Procedure: ENDOSCOPIC SINUS SURGERY WITH NAVIGATION;  Surgeon: Graylin Shiver, MD;  Location: [facility] OR;  Service: ENT;  Laterality: Bilateral;  . TONSILLECTOMY AND ADENOIDECTOMY    . TURBINATE REDUCTION Bilateral 01/25/2018   Procedure: BILATERAL TURBINATE REDUCTION;  Surgeon: Graylin Shiver, MD;  Location: [facility] OR;  Service: ENT;  Laterality: Bilateral;  . WISDOM TOOTH EXTRACTION     Family History  Problem Relation Age of Onset  . Heart disease Mother   . Congestive Heart Failure Mother 71       Deceased  . Alcoholism Father        Living  .  Arthritis Father   . Diabetes Maternal Aunt   . Cancer Other        PGGM  . Breast cancer Maternal Aunt   . Lung cancer Maternal Uncle   . Heart disease Brother   . Heart attack Brother   . Congestive Heart Failure Brother 47       Deceased  . Stroke Maternal Aunt   . Emphysema Brother        #2  . Arthritis Sister        #1  . Allergies Daughter   . Kidney Stones Daughter   . Gallbladder disease Daughter   . Migraines Daughter    Social History   Socioeconomic History  . Marital status: Divorced    Spouse name: Not on file  . Number of children: 3  . Years of education: Not on file  . Highest education level: Not on file  Occupational History  . Occupation: MudloggerAC Technician  Social Needs  . Financial resource  strain: Not on file  . Food insecurity:    Worry: Not on file    Inability: Not on file  . Transportation needs:    Medical: Not on file    Non-medical: Not on file  Tobacco Use  . Smoking status: Former Smoker    Packs/day: 2.00    Years: 30.00    Pack years: 60.00    Types: Cigarettes    Last attempt to quit: 07/1999    Years since quitting: 19.4  . Smokeless tobacco: Former NeurosurgeonUser    Types: Snuff, Chew  Substance and Sexual Activity  . Alcohol use: Yes    Comment: 01/25/2018 "might have wine 3 times/year"  . Drug use: No  . Sexual activity: Not on file  Lifestyle  . Physical activity:    Days per week: Not on file    Minutes per session: Not on file  . Stress: Not on file  Relationships  . Social connections:    Talks on phone: Not on file    Gets together: Not on file    Attends religious service: Not on file    Active member of club or organization: Not on file    Attends meetings of clubs or organizations: Not on file    Relationship status: Not on file  Other Topics Concern  . Not on file  Social History Narrative  . Not on file    Outpatient Encounter Medications as of 12/23/2018  Medication Sig  . albuterol (PROVENTIL HFA;VENTOLIN HFA) 108 (90 Base) MCG/ACT inhaler Inhale 2 puffs into the lungs every 6 (six) hours as needed for wheezing or shortness of breath.  Marland Kitchen. albuterol (PROVENTIL) (5 MG/ML) 0.5% nebulizer solution Take 0.5 mLs (2.5 mg total) by nebulization every 6 (six) hours as needed for wheezing or shortness of breath.  . Ascorbic Acid (VITAMIN C) 1000 MG tablet Take 1,000 mg by mouth daily.  Marland Kitchen. aspirin 81 MG tablet Take 81 mg by mouth daily.  Marland Kitchen. atorvastatin (LIPITOR) 10 MG tablet Take 1 tablet (10 mg total) by mouth daily.  . Cholecalciferol (VITAMIN D-3) 1000 UNITS CAPS Take 1,000 Units by mouth daily.  . fluticasone (FLOVENT HFA) 110 MCG/ACT inhaler Inhale 1 puff into the lungs 2 (two) times daily.  Marland Kitchen. gabapentin (NEURONTIN) 800 MG tablet Take 800 mg by  mouth 5 (five) times daily.   . GuaiFENesin (MUCINEX PO) Take 1 tablet by mouth every 4 (four) hours as needed (allergies. Patient states he doesn't recall tablet dose.).  .Marland Kitchen  ibuprofen (ADVIL,MOTRIN) 200 MG tablet Take 200 mg by mouth every 6 (six) hours as needed for mild pain.  Marland Kitchen ipratropium (ATROVENT) 0.03 % nasal spray Place 2 sprays into both nostrils every 12 (twelve) hours.  Marland Kitchen levocetirizine (XYZAL) 5 MG tablet Take 1 tablet (5 mg total) by mouth every evening.  . montelukast (SINGULAIR) 10 MG tablet TAKE 1 TABLET BY MOUTH EVERYDAY AT BEDTIME  . omeprazole (PRILOSEC) 20 MG capsule Take 1 capsule (20 mg total) by mouth daily.  . primidone (MYSOLINE) 50 MG tablet Take 3 tablets (150 mg total) by mouth 2 (two) times daily.  . propranolol (INDERAL) 40 MG tablet Take 1 tab in AM and 1/2 tab in PM.  . tamsulosin (FLOMAX) 0.4 MG CAPS capsule TAKE 1 CAPSULE BY MOUTH EVERYDAY AT BEDTIME  . vitamin B-12 (CYANOCOBALAMIN) 1000 MCG tablet Take 1,000 mcg by mouth daily.   No facility-administered encounter medications on file as of 12/23/2018.     Activities of Daily Living In your present state of health, do you have any difficulty performing the following activities: 01/25/2018 01/18/2018  Hearing? Y N  Comment "little bit" -  Vision? Y N  Comment "little bit" -  Difficulty concentrating or making decisions? N N  Walking or climbing stairs? N N  Dressing or bathing? N N  Doing errands, shopping? N -  Some recent data might be hidden    Patient Care Team: Sharlene Dory, DO as PCP - General (Family Medicine) Quintella Reichert, MD as PCP - Cardiology (Cardiology) Morene Antu, MD as Consulting Physician (Neurology) Abelina Bachelor, MD as Referring Physician (Family Medicine)   Assessment:   This is a routine wellness examination for Ankur. Physical assessment deferred to PCP.  Exercise Activities and Dietary recommendations   Diet (meal preparation, eat out, water intake,  caffeinated beverages, dairy products, fruits and vegetables): {Desc; diets:16563} Breakfast: Lunch:  Dinner:      Goals   None     Fall Risk Fall Risk  07/25/2017 05/31/2017 02/07/2017 05/30/2016  Falls in the past year? No No No Yes  Number falls in past yr: - - - 1  Injury with Fall? - - - Yes  Risk for fall due to : - - - (No Data)  Risk for fall due to: Comment - - - Tangled in hoses    Depression Screen PHQ 2/9 Scores 07/25/2017 05/31/2017 02/07/2017 07/31/2015  PHQ - 2 Score 0 0 0 0  PHQ- 9 Score - 0 - -    Cognitive Function MMSE - Mini Mental State Exam 05/31/2017  Orientation to time 5  Orientation to Place 5  Registration 3  Attention/ Calculation 3  Recall 1  Language- name 2 objects 2  Language- repeat 1  Language- follow 3 step command 3  Language- read & follow direction 1  Write a sentence 1  Copy design 1  Total score 26        Immunization History  Administered Date(s) Administered  . Influenza,inj,Quad PF,6+ Mos 10/04/2015, 11/25/2016, 11/14/2017  . Pneumococcal Conjugate-13 09/28/2017  . Tdap 05/30/2016    Screening Tests Health Maintenance  Topic Date Due  . Hepatitis C Screening  1950-12-15  . INFLUENZA VACCINE  06/20/2018  . PNA vac Low Risk Adult (2 of 2 - PPSV23) 09/28/2018  . COLONOSCOPY  11/20/2024  . TETANUS/TDAP  05/30/2026       Plan:   ***  I have personally reviewed and noted the following in the patient's chart:   .  Medical and social history . Use of alcohol, tobacco or illicit drugs  . Current medications and supplements . Functional ability and status . Nutritional status . Physical activity . Advanced directives . List of other physicians . Hospitalizations, surgeries, and ER visits in previous 12 months . Vitals . Screenings to include cognitive, depression, and falls . Referrals and appointments  In addition, I have reviewed and discussed with patient certain preventive protocols, quality metrics, and best  practice recommendations. A written personalized care plan for preventive services as well as general preventive health recommendations were provided to patient.     Avon GullyBritt, Tyler Cubit Angel, [place]RN  12/20/2018

## 2018-12-23 ENCOUNTER — Ambulatory Visit: Payer: Medicare HMO | Admitting: *Deleted

## 2018-12-23 NOTE — Progress Notes (Deleted)
Cardiology Office Note:    Date:  12/23/2018   ID:  Warren Baker, DOB 1950/12/16, MRN [id number]  PCP:  Sharlene DoryWendling, Nicholas Paul, DO  Cardiologist:  Armanda Magicraci Dona Klemann, MD    Referring MD: Sharlene DoryWendling, Nicholas Paul*   No chief complaint on file.   History of Present Illness:    Warren RidgeWilliam Baker is a 68 y.o. male with a hx of mild OSA with an AHI of 11.3/hr and is on CPAP therapy.  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 He has an uncle that had SCD and a brother who had a cardiac problems and got an AICD at 68yo in setting of MI.He was referred to EP but felt that AICD was not indicated at this time.    He is here today for followup and is doing well.  He denies any chest pain or pressure, SOB, DOE, PND, orthopnea, LE edema, dizziness, palpitations or syncope. He is compliant with his meds and is tolerating meds with no SE.  He is doing well with his CPAP device and thinks that he has gotten used to it.  He tolerates the mask and feels the pressure is adequate.  Since going on CPAP he feels rested in the am and has no significant daytime sleepiness.  He denies any significant mouth or nasal dryness or nasal congestion.  He does not think that he snores.     Past Medical History:  Diagnosis Date  . Arthritis    "legs, back, shoulders" (01/25/2018)  . Broken leg   . Chronic back pain    "misalligned discs" (01/25/2018)  . COPD (chronic obstructive pulmonary disease) (HCC)   . Excessive daytime sleepiness 07/13/2016  . Family history of adverse reaction to anesthesia    "think my mom had a hard time waking up one time" (01/25/2018)  . GERD (gastroesophageal reflux disease)   . High cholesterol   . History of chicken pox   . History of kidney stones   . 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 OSA on CPAP 11/01/2016   Mild with AHI  11/hr  . Seasonal allergies   . Tremors of nervous system     Past Surgical History:  Procedure Laterality Date  . COLONOSCOPY    . COLONOSCOPY W/ BIOPSIES AND POLYPECTOMY  ~ 2016  . MAXILLARY ANTROSTOMY Bilateral 01/25/2018   Procedure: BILATERAL MAXILLARY ANTROSTOMY;  Surgeon: Graylin ShiverMarcellino, Amanda J, MD;  [facility];  Service: ENT;  Laterality: Bilateral;  . NASAL TURBINATE REDUCTION Bilateral 01/25/2018  . SINUS ENDO W/FUSION Bilateral 01/25/2018   Procedure: ENDOSCOPIC SINUS SURGERY WITH NAVIGATION;  Surgeon: Graylin ShiverMarcellino, Amanda J, MD;  [facility];  Service: ENT;  Laterality: Bilateral;  . TONSILLECTOMY AND ADENOIDECTOMY    . TURBINATE REDUCTION Bilateral 01/25/2018   Procedure: BILATERAL TURBINATE REDUCTION;  Surgeon: Graylin ShiverMarcellino, Amanda J, MD;  [facility];  Service: ENT;  Laterality: Bilateral;  . WISDOM TOOTH EXTRACTION      Current Medications: No outpatient medications have been marked as taking for the 12/24/18 encounter (Appointment) with Quintella Reicherturner, Darlyn Repsher R, MD.     Allergies:   Codeine and Propoxyphene   Social History   Socioeconomic History  . Marital status: Divorced    Spouse name: Not on file  . Number of children: 3  . Years of education: Not on file  .  Highest education level: Not on file  Occupational History  . Occupation: MudloggerAC Technician  Social Needs  . Financial resource strain: Not on file  . Food insecurity:    Worry: Not on file    Inability: Not on file  . Transportation needs:    Medical: Not on file    Non-medical: Not on file  Tobacco Use  . Smoking status: Former Smoker    Packs/day: 2.00    Years: 30.00    Pack years: 60.00    Types: Cigarettes    Last attempt to quit: 07/1999    Years since quitting: 19.4  . Smokeless tobacco: Former NeurosurgeonUser    Types: Snuff, Chew  Substance and Sexual Activity  . Alcohol use: Yes    Comment: 01/25/2018 "might have wine 3 times/year"  . Drug use: No  . Sexual activity: Not on file  Lifestyle  .  Physical activity:    Days per week: Not on file    Minutes per session: Not on file  . Stress: Not on file  Relationships  . Social connections:    Talks on phone: Not on file    Gets together: Not on file    Attends religious service: Not on file    Active member of club or organization: Not on file    Attends meetings of clubs or organizations: Not on file    Relationship status: Not on file  Other Topics Concern  . Not on file  Social History Narrative  . Not on file     Family History: The patient's family history includes Alcoholism in his father; Allergies in his daughter; Arthritis in his father and sister; Breast cancer in his maternal aunt; Cancer in an other family member; Congestive Heart Failure (age of onset: 247) in his brother; Congestive Heart Failure (age of onset: 1279) in his mother; Diabetes in his maternal aunt; Emphysema in his brother; Gallbladder disease in his daughter; Heart attack in his brother; Heart disease in his brother and mother; Kidney Stones in his daughter; Lung cancer in his maternal uncle; Migraines in his daughter; Stroke in his maternal aunt.  ROS:   Please see the history of present illness.    ROS  All other systems reviewed and negative.   EKGs/Labs/Other Studies Reviewed:    The following studies were reviewed today: PAP  EKG:  EKG is  ordered today.  The ekg ordered today demonstrates ***  Recent Labs: 01/18/2018: Hemoglobin 15.2; Platelets 139 05/15/2018: ALT 20; BUN 14; Creatinine, Ser 1.01; Potassium 4.8; Sodium 138   Recent Lipid Panel    Component Value Date/Time   CHOL 131 05/15/2018 1447   TRIG 144.0 05/15/2018 1447   HDL 33.80 (L) 05/15/2018 1447   CHOLHDL 4 05/15/2018 1447   VLDL 28.8 05/15/2018 1447   LDLCALC 69 05/15/2018 1447    Physical Exam:    VS:  There were no vitals taken for this visit.    Wt Readings from Last 3 Encounters:  09/13/18 (!) 315 lb 8 oz (143.1 kg)  08/28/18 (!) 318 lb 6 oz (144.4 kg)    05/15/18 (!) 317 lb 4 oz (143.9 kg)     GEN:  Well nourished, well developed in no acute distress HEENT: Normal NECK: No JVD; No carotid bruits LYMPHATICS: No lymphadenopathy CARDIAC: RRR, no murmurs, rubs, gallops RESPIRATORY:  Clear to auscultation without rales, wheezing or rhonchi  ABDOMEN: Soft, non-tender, non-distended MUSCULOSKELETAL:  No edema; No deformity  SKIN: Warm and dry NEUROLOGIC:  Alert and oriented x 3 PSYCHIATRIC:  Normal affect   ASSESSMENT:    1. HOCM (hypertrophic obstructive cardiomyopathy) (HCC)   2. OSA (obstructive sleep apnea)   3. Obesity (BMI 30-39.9)    PLAN:    In order of problems listed above:  1.  HOCM - He  had an echo showing possible apical HOCM and Cardiac MRI was ordered but unfortunately he was too big for the MRI scanner. He has an uncle that had SCD and a brother who had a cardiac problems and got an AICD at 68yo in setting of MI.He was referred to EP but felt that AICD was not indicated at this time.  He denies any syncopal episodes or palpitations.   2.  OSA - the patient is tolerating PAP therapy well without any problems. The PAP download was reviewed today and showed an AHI of ***/hr on *** cm H2O with ***% compliance in using more than 4 hours nightly.  The patient has been using and benefiting from PAP use and will continue to benefit from therapy.   3.  Obesity - I have encouraged him to get into a routine exercise program and cut back on carbs and portions.      Medication Adjustments/Labs and Tests Ordered: Current medicines are reviewed at length with the patient today.  Concerns regarding medicines are outlined above.  No orders of the defined types were placed in this encounter.  No orders of the defined types were placed in this encounter.   Signed, Armanda Magic, MD  12/23/2018 7:29 PM    [facility]

## 2018-12-24 ENCOUNTER — Ambulatory Visit: Payer: Medicare HMO | Admitting: Cardiology

## 2019-01-06 ENCOUNTER — Other Ambulatory Visit: Payer: Self-pay | Admitting: Family Medicine

## 2019-01-06 DIAGNOSIS — J40 Bronchitis, not specified as acute or chronic: Secondary | ICD-10-CM

## 2019-01-08 ENCOUNTER — Telehealth: Payer: Self-pay | Admitting: *Deleted

## 2019-01-08 NOTE — Telephone Encounter (Signed)
-----   Message from Traci R Turner, MD sent at 01/02/2019 10:44 PM EST ----- Good AHI and compliance.  Continue current PAP settings. 

## 2019-01-08 NOTE — Telephone Encounter (Signed)
Informed patient of compliance results and verbalized understanding was indicated. Patient is aware and agreeable to AHI being within range at 1.6. Patient is aware and agreeable to being in compliance with machine usage. Patient is aware and agreeable to no change in current pressures. 

## 2019-02-18 ENCOUNTER — Other Ambulatory Visit: Payer: Self-pay | Admitting: Family Medicine

## 2019-03-18 ENCOUNTER — Other Ambulatory Visit: Payer: Self-pay | Admitting: Family Medicine

## 2019-03-18 DIAGNOSIS — E785 Hyperlipidemia, unspecified: Secondary | ICD-10-CM

## 2019-04-03 DIAGNOSIS — H524 Presbyopia: Secondary | ICD-10-CM | POA: Diagnosis not present

## 2019-04-10 ENCOUNTER — Other Ambulatory Visit: Payer: Self-pay | Admitting: Family Medicine

## 2019-04-10 DIAGNOSIS — J40 Bronchitis, not specified as acute or chronic: Secondary | ICD-10-CM

## 2019-04-22 ENCOUNTER — Encounter: Payer: Self-pay | Admitting: Family Medicine

## 2019-04-22 ENCOUNTER — Telehealth: Payer: Self-pay | Admitting: Family Medicine

## 2019-04-22 NOTE — Telephone Encounter (Signed)
Our records have 40 mg in AM and 20 mg (1/2 tab) in evening. I haven't see him in nearly a year though. He should probably be seen for a CPE. Ty.

## 2019-04-22 NOTE — Telephone Encounter (Signed)
Copied from CRM [id number]. Topic: General - Other >> Apr 22, 2019 10:14 AM Marylen Ponto wrote: Reason for CRM: Lauren with [facility] called for clarification on the Rx for propranolol (INDERAL) 40 MG tablet. Lauren stated she also has a Rx for propranolol ER 60 MG tablet and she would like clarification on which Rx patient should have. Cb# [phone]  His instructions say to take 1 and 1/2 which is 60mg ??

## 2019-04-23 NOTE — Telephone Encounter (Signed)
Called left message to call back to the office and schedule an appt with PCP.

## 2019-04-25 ENCOUNTER — Telehealth: Payer: Self-pay | Admitting: Family Medicine

## 2019-04-29 ENCOUNTER — Telehealth: Payer: Self-pay | Admitting: Family Medicine

## 2019-04-29 NOTE — Telephone Encounter (Signed)
Copied from [place] [id number]. Topic: General - Other >> Apr 29, 2019 11:44 AM Celene Kras A wrote: Reason for CRM: Attempted to contact office. Pt called to return call regarding his appt on 05/02/2019. Please advise.  Called left message to call back.

## 2019-05-02 ENCOUNTER — Ambulatory Visit (INDEPENDENT_AMBULATORY_CARE_PROVIDER_SITE_OTHER): Payer: Medicare HMO | Admitting: Family Medicine

## 2019-05-02 ENCOUNTER — Encounter: Payer: Self-pay | Admitting: Family Medicine

## 2019-05-02 ENCOUNTER — Other Ambulatory Visit: Payer: Self-pay

## 2019-05-02 VITALS — BP 128/78 | HR 50 | Temp 98.0°F | Ht 76.0 in | Wt 309.0 lb

## 2019-05-02 DIAGNOSIS — N529 Male erectile dysfunction, unspecified: Secondary | ICD-10-CM

## 2019-05-02 DIAGNOSIS — Z23 Encounter for immunization: Secondary | ICD-10-CM

## 2019-05-02 DIAGNOSIS — Z1159 Encounter for screening for other viral diseases: Secondary | ICD-10-CM | POA: Diagnosis not present

## 2019-05-02 DIAGNOSIS — J42 Unspecified chronic bronchitis: Secondary | ICD-10-CM

## 2019-05-02 DIAGNOSIS — K219 Gastro-esophageal reflux disease without esophagitis: Secondary | ICD-10-CM | POA: Diagnosis not present

## 2019-05-02 DIAGNOSIS — E785 Hyperlipidemia, unspecified: Secondary | ICD-10-CM | POA: Diagnosis not present

## 2019-05-02 MED ORDER — UMECLIDINIUM BROMIDE 62.5 MCG/INH IN AEPB: 1.0000 | INHALATION_SPRAY | Freq: Every day | RESPIRATORY_TRACT | 5 refills | Status: DC

## 2019-05-02 MED ORDER — SILDENAFIL CITRATE 100 MG PO TABS: 50.0000 mg | ORAL_TABLET | Freq: Every day | ORAL | 2 refills | Status: DC | PRN

## 2019-05-02 NOTE — Progress Notes (Signed)
Chief Complaint  Patient presents with  . Follow-up    Subjective: Hyperlipidemia Patient presents for Hyperlipidemia follow up. Currently taking atorvastatin 10 mg/d and compliance with treatment thus far has been good. He denies myalgias. He is not adhering to a healthy diet. Exercise: Wt resistance, active at work w walking The patient is not known to have coexisting coronary artery disease.  COPD- Taking albuterol prn. Twice daily recently. Allergies seem to make things worse. No current sob or wheezing. No cough.   GERD- well controlled on omeprazole 20 mg/d, no AE's, tolerating well.   ED- has not been sexually active, but is interested in a male. Interested in trying a medicine. Concerned about ability to both attain and maintain an erection.   ROS: Heart: Denies chest pain or palpitations Lungs: Denies SOB or cough  Past Medical History:  Diagnosis Date  . Arthritis    "legs, back, shoulders" (01/25/2018)  . Chronic back pain    "misalligned discs" (01/25/2018)  . COPD (chronic obstructive pulmonary disease) ([place])   . Excessive daytime sleepiness 07/13/2016  . Family history of adverse reaction to anesthesia    "think my mom had a hard time waking up one time" (01/25/2018)  . GERD (gastroesophageal reflux disease)   . High cholesterol   . History of kidney stones   . 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 OSA on CPAP 11/01/2016   Mild with AHI 11/hr  . Seasonal allergies   . Tremors of nervous system     Objective: BP 128/78 (BP Location: Left Arm, Patient Position: Sitting, Cuff Size: Large)   Pulse (!) 50   Temp 98 F (36.7 C) (Oral)   Ht 6\' 4"  (1.93 m)   Wt (!) 309 lb (140.2 kg)   SpO2 94%   BMI 37.61 kg/m  General: Awake, appears stated age HEENT: MMM Heart: distant heart sounds; otherwise RRR, no LE edema, no bruits Lungs: Distant breath sounds; otherwise CTAB, no rales, wheezes or  rhonchi. No accessory muscle use GI: BS+, s, nt, mild distension, umbilical hernia noted Psych: Age appropriate judgment and insight, normal affect and mood  Assessment and Plan: Hyperlipidemia, unspecified hyperlipidemia type - Plan: Comprehensive metabolic panel, Lipid panel, counseled on diet and exercise  Chronic bronchitis, unspecified chronic bronchitis type ([place]) - Plan: Start LAMA  Gastroesophageal reflux disease without esophagitis - Plan: Cont PPI  Encounter for hepatitis C screening test for low risk patient - Plan: Hepatitis C antibody  Erectile dysfunction, unspecified erectile dysfunction type - Plan: Trial sildenafil. Rec'd calling ins co and online coupons.   Need for vaccination against Streptococcus pneumoniae - Plan: Pneumococcal polysaccharide vaccine 23-valent greater than or equal to 2yo subcutaneous/IM  Orders as above. F/u in 6 mo for CPE or prn. The patient voiced understanding and agreement to the plan.  [place], DO 05/02/19  2:49 PM

## 2019-05-02 NOTE — Patient Instructions (Addendum)
Keep the diet clean and stay active.  Give [place] 2-3 business days to get the results of your labs back.   Let us know if you need anything.  Call your insurance company to see what erectile dysfunction medications they wil cover. Looking for manufacturer coupons online is also reasonable.

## 2019-05-05 LAB — COMPREHENSIVE METABOLIC PANEL
AG Ratio: 1.5 (calc) (ref 1.0–2.5)
ALT: 17 U/L (ref 9–46)
AST: 29 U/L (ref 10–35)
Albumin: 4 g/dL (ref 3.6–5.1)
Alkaline phosphatase (APISO): 53 U/L (ref 35–144)
BUN: 16 mg/dL (ref 7–25)
CO2: 29 mmol/L (ref 20–32)
Calcium: 9.4 mg/dL (ref 8.6–10.3)
Chloride: 102 mmol/L (ref 98–110)
Creat: 1.01 mg/dL (ref 0.70–1.25)
Globulin: 2.6 g/dL (calc) (ref 1.9–3.7)
Glucose, Bld: 82 mg/dL (ref 65–99)
Potassium: 4.8 mmol/L (ref 3.5–5.3)
Sodium: 140 mmol/L (ref 135–146)
Total Bilirubin: 0.4 mg/dL (ref 0.2–1.2)
Total Protein: 6.6 g/dL (ref 6.1–8.1)

## 2019-05-05 LAB — HEPATITIS C ANTIBODY
Hepatitis C Ab: NONREACTIVE
SIGNAL TO CUT-OFF: 0.06 (ref <1.00)

## 2019-05-05 LAB — LIPID PANEL
Cholesterol: 116 mg/dL (ref <200)
HDL: 33 mg/dL — ABNORMAL LOW (ref >=40)
LDL Cholesterol (Calc): 61 mg/dL (calc)
Non-HDL Cholesterol (Calc): 83 mg/dL (calc) (ref <130)
Total CHOL/HDL Ratio: 3.5 (calc) (ref <5.0)
Triglycerides: 134 mg/dL (ref <150)

## 2019-05-13 DIAGNOSIS — H903 Sensorineural hearing loss, bilateral: Secondary | ICD-10-CM | POA: Diagnosis not present

## 2019-05-19 DIAGNOSIS — G4733 Obstructive sleep apnea (adult) (pediatric): Secondary | ICD-10-CM | POA: Diagnosis not present

## 2019-06-02 ENCOUNTER — Ambulatory Visit: Payer: Medicare HMO | Admitting: Family Medicine

## 2019-06-05 DIAGNOSIS — G250 Essential tremor: Secondary | ICD-10-CM | POA: Diagnosis not present

## 2019-06-06 ENCOUNTER — Ambulatory Visit: Payer: Medicare HMO | Admitting: Family Medicine

## 2019-06-08 ENCOUNTER — Other Ambulatory Visit: Payer: Self-pay | Admitting: Family Medicine

## 2019-06-17 ENCOUNTER — Encounter: Payer: Self-pay | Admitting: Family Medicine

## 2019-06-17 ENCOUNTER — Ambulatory Visit (INDEPENDENT_AMBULATORY_CARE_PROVIDER_SITE_OTHER): Payer: Medicare HMO | Admitting: Family Medicine

## 2019-06-17 ENCOUNTER — Other Ambulatory Visit: Payer: Self-pay

## 2019-06-17 VITALS — BP 120/70 | HR 54 | Temp 98.3°F | Ht 75.0 in | Wt 307.0 lb

## 2019-06-17 DIAGNOSIS — J302 Other seasonal allergic rhinitis: Secondary | ICD-10-CM | POA: Diagnosis not present

## 2019-06-17 DIAGNOSIS — M549 Dorsalgia, unspecified: Secondary | ICD-10-CM

## 2019-06-17 DIAGNOSIS — J42 Unspecified chronic bronchitis: Secondary | ICD-10-CM | POA: Diagnosis not present

## 2019-06-17 MED ORDER — PREDNISONE 20 MG PO TABS: 40.0000 mg | ORAL_TABLET | Freq: Every day | ORAL | 0 refills | Status: AC

## 2019-06-17 MED ORDER — LEVOCETIRIZINE DIHYDROCHLORIDE 5 MG PO TABS: 5.0000 mg | ORAL_TABLET | Freq: Every evening | ORAL | 1 refills | Status: DC

## 2019-06-17 MED ORDER — FLUTICASONE PROPIONATE 50 MCG/ACT NA SUSP: 2.0000 | Freq: Every day | NASAL | 5 refills | Status: DC

## 2019-06-17 MED ORDER — CYCLOBENZAPRINE HCL 10 MG PO TABS: 5.0000 mg | ORAL_TABLET | Freq: Three times a day (TID) | ORAL | 0 refills | Status: AC | PRN

## 2019-06-17 MED ORDER — SILDENAFIL CITRATE 100 MG PO TABS: 50.0000 mg | ORAL_TABLET | Freq: Every day | ORAL | 2 refills | Status: DC | PRN

## 2019-06-17 NOTE — Patient Instructions (Signed)
Heat (pad or rice pillow in microwave) over affected area, 10-15 minutes twice daily.   Ice/cold pack over area for 10-15 min twice daily.  OK to take Tylenol 1000 mg (2 extra strength tabs) or 975 mg (3 regular strength tabs) every 6 hours as needed.  Take Flexeril (cyclobenzaprine) 1-2 hours before planned bedtime. If it makes you drowsy, do not take during the day. You can try half a tab the following night.  If you are having drainage issues in the next 4-6 weeks, send me a message and we will get you in with the allergist.   Mid-Back Strain Rehab It is normal to feel mild stretching, pulling, tightness, or discomfort as you do these exercises, but you should stop right away if you feel sudden pain or your pain gets worse.   Stretching and range of motion exercises This exercise warms up your muscles and joints and improves the movement and flexibility of your back and shoulders. This exercise also help to relieve pain. Exercise A: Chest and spine stretch    1. Lie down on your back on a firm surface. 2. Roll a towel or a small blanket so it is about 4 inches (10 cm) in diameter. 3. Put the towel lengthwise under the middle of your back so it is under your spine, but not under your shoulder blades. 4. To increase the stretch, you may put your hands behind your head and let your elbows fall to your sides. 5. Hold for 30 seconds. Repeat exercise 2 times. Complete this exercise 3 times per week.  Strengthening exercises These exercises build strength and endurance in your back and your shoulder blade muscles. Endurance is the ability to use your muscles for a long time, even after they get tired. Exercise B: Alternating arm and leg raises    1. Get on your hands and knees on a firm surface. If you are on a hard floor, you may want to use padding to cushion your knees, such as an exercise mat. 2. Line up your arms and legs. Your hands should be below your shoulders, and your knees  should be below your hips. 3. Lift your left leg behind you. At the same time, raise your right arm and straighten it in front of you. ? Do not lift your leg higher than your hip. ? Do not lift your arm higher than your shoulder. ? Keep your abdominal and back muscles tight. ? Keep your hips facing the ground. ? Do not arch your back. ? Keep your balance carefully, and do not hold your breath. 4. Hold for 3 seconds. 5. Slowly return to the starting position and repeat with your right leg and your left arm. Repeat 2 times. Complete this exercise 3 times per week. Exercise C: Straight arm rows (shoulder extension)     1. Stand with your feet shoulder width apart. 2. Secure an exercise band to a stable object in front of you so the band is at or above shoulder height. 3. Hold one end of the exercise band in each hand. 4. Straighten your elbows and lift your hands up to shoulder height. 5. Step back, away from the secured end of the exercise band, until the band stretches. 6. Squeeze your shoulder blades together and pull your hands down to the sides of your thighs. Stop when your hands are straight down by your sides. Do not let your hands go behind your body. 7. Hold for 3 seconds. 8. Slowly return  to the starting position. Repeat 2 times. Complete this exercise 3 times per week. Exercise D: Shoulder external rotation, prone 1. Lie on your abdomen on a firm bed so your left / right forearm hangs over the edge of the bed and your upper arm is on the bed, straight out from your body. ? Your elbow should be bent. ? Your palm should be facing your feet. 2. If instructed, hold a 5 lb weight in your hand. 3. Squeeze your shoulder blade toward the middle of your back. Do not let your shoulder lift toward your ear. 4. Keep your elbow bent in an "L" shape (90 degrees) while you slowly move your forearm up toward the ceiling. Move your forearm up to the height of the bed, toward your head. ? Your  upper arm should not move. ? At the top of the movement, your palm should face the floor. 5. Hold for 3 seconds. 6. Slowly return to the starting position and relax your muscles. Repeat 3 times. Complete this exercise 3 times per week. Exercise E: Scapular retraction and external rotation, rowing    1. Sit in a stable chair without armrests, or stand. 2. Secure an exercise band to a stable object in front of you so it is at shoulder height. 3. Hold one end of the exercise band in each hand. 4. Bring your arms out straight in front of you. 5. Step back, away from the secured end of the exercise band, until the band stretches. 6. Pull the band backward. As you do this, bend your elbows and squeeze your shoulder blades together, but avoid letting the rest of your body move. Do not let your shoulders lift up toward your ears. 7. Stop when your elbows are at your sides or slightly behind your body. 8. Hold for 5 seconds. 9. Slowly straighten your arms to return to the starting position. Repeat 2 times. Complete this exercise 3 times per week. Posture and body mechanics    Body mechanics refers to the movements and positions of your body while you do your daily activities. Posture is part of body mechanics. Good posture and healthy body mechanics can help to relieve stress in your body's tissues and joints. Good posture means that your spine is in its natural S-curve position (your spine is neutral), your shoulders are pulled back slightly, and your head is not tipped forward. The following are general guidelines for applying improved posture and body mechanics to your everyday activities. Standing     When standing, keep your spine neutral and your feet about hip-width apart. Keep a slight bend in your knees. Your ears, shoulders, and hips should line up.  When you do a task in which you lean forward while standing in one place for a long time, place one foot up on a stable object that is 2-4  inches (5-10 cm) high, such as a footstool. This helps keep your spine neutral. Sitting     When sitting, keep your spine neutral and keep your feet flat on the floor. Use a footrest, if necessary, and keep your thighs parallel to the floor. Avoid rounding your shoulders, and avoid tilting your head forward.  When working at a desk or a computer, keep your desk at a height where your hands are slightly lower than your elbows. Slide your chair under your desk so you are close enough to maintain good posture.  When working at a computer, place your monitor at a height where you  are looking straight ahead and you do not have to tilt your head forward or downward to look at the screen. Resting    When lying down and resting, avoid positions that are most painful for you.  If you have pain with activities such as sitting, bending, stooping, or squatting (flexion-based activities), lie in a position in which your body does not bend very much. For example, avoid curling up on your side with your arms and knees near your chest (fetal position).  If you have pain with activities such as standing for a long time or reaching with your arms (extension-based activities), lie with your spine in a neutral position and bend your knees slightly. Try the following positions:  Lying on your side with a pillow between your knees.  Lying on your back with a pillow under your knees.   Lifting     When lifting objects, keep your feet at least shoulder-width apart and tighten your abdominal muscles.  Bend your knees and hips and keep your spine neutral. It is important to lift using the strength of your legs, not your back. Do not lock your knees straight out.  Always ask for help to lift heavy or awkward objects. Make sure you discuss any questions you have with your health care provider.

## 2019-06-17 NOTE — Progress Notes (Signed)
Chief Complaint  Patient presents with  . Follow-up    Subjective: Patient is a 68 y.o. male here for f/u COPD.  Started on Incruse 6 mo ago. Reports compliance. Doing much better, has not had to use SABA since 6/12.   Hx of allergies. Continues to have sinus pressure. Not on ICS, but reports taking everything on list, which has Xyzal and montelukast. Drainage is most bothersome.   5 d ago, slipped on some steps and caught himself awkwardly on his side. Reports mid back pain b/l b/t shoulder blades, getting worse. No neurologic s/s's. Tried Tylenol at home w some relief.    ROS: HEENT: +nasal drainage Lungs: no SOB MSK: +back pain  Past Medical History:  Diagnosis Date  . Arthritis    "legs, back, shoulders" (01/25/2018)  . Chronic back pain    "misalligned discs" (01/25/2018)  . COPD (chronic obstructive pulmonary disease) ([place])   . Excessive daytime sleepiness 07/13/2016  . Family history of adverse reaction to anesthesia    "think my mom had a hard time waking up one time" (01/25/2018)  . GERD (gastroesophageal reflux disease)   . High cholesterol   . History of kidney stones   . 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 OSA on CPAP 11/01/2016   Mild with AHI 11/hr  . Seasonal allergies   . Tremors of nervous system     Objective: BP 120/70 (BP Location: Right Arm, Patient Position: Sitting, Cuff Size: Large)   Pulse (!) 54   Temp 98.3 F (36.8 C) (Oral)   Ht 6\' 3"  (1.905 m)   Wt (!) 307 lb (139.3 kg)   SpO2 93%   BMI 38.37 kg/m  General: Awake, appears stated age HEENT: MMM, EOMi, HOH Heart: RRR, no murmurs Lungs: CTAB, no rales, wheezes or rhonchi. No accessory muscle use MSK: +ttp over parasp msc in mid thor region around T4-6 Neuro: DTR's equal and symmetric in UE's, no clonus or cerebellar signs Psych: Age appropriate judgment and insight, normal affect and mood  Assessment and  Plan: Chronic bronchitis, unspecified chronic bronchitis type ([place]) - Plan: Cont LAMA. Good response. SABA prn.   Seasonal allergies - Plan: levocetirizine (XYZAL) 5 MG tablet, fluticasone (FLONASE) 50 MCG/ACT nasal spray, Cont Singulair. If no improvement over the next 4-6 weeks, he will let us know and we will refer to allergist.   Upper back pain - Plan: cyclobenzaprine (FLEXERIL) 10 MG tablet, predniSONE (DELTASONE) 20 MG tablet, stretches/exercises, Tylenol, heat, ice.   F/u as originally scheduled in Dec.  The patient voiced understanding and agreement to the plan.  [place], DO 06/17/19  1:09 PM

## 2019-07-09 DIAGNOSIS — G4733 Obstructive sleep apnea (adult) (pediatric): Secondary | ICD-10-CM | POA: Diagnosis not present

## 2019-08-11 DIAGNOSIS — M5417 Radiculopathy, lumbosacral region: Secondary | ICD-10-CM | POA: Diagnosis not present

## 2019-08-11 DIAGNOSIS — G250 Essential tremor: Secondary | ICD-10-CM | POA: Diagnosis not present

## 2019-08-11 DIAGNOSIS — G5711 Meralgia paresthetica, right lower limb: Secondary | ICD-10-CM | POA: Diagnosis not present

## 2019-08-18 DIAGNOSIS — G4733 Obstructive sleep apnea (adult) (pediatric): Secondary | ICD-10-CM | POA: Diagnosis not present

## 2019-08-22 DIAGNOSIS — G5711 Meralgia paresthetica, right lower limb: Secondary | ICD-10-CM | POA: Diagnosis not present

## 2019-08-22 DIAGNOSIS — M545 Low back pain: Secondary | ICD-10-CM | POA: Diagnosis not present

## 2019-08-22 DIAGNOSIS — G603 Idiopathic progressive neuropathy: Secondary | ICD-10-CM | POA: Diagnosis not present

## 2019-08-22 DIAGNOSIS — G250 Essential tremor: Secondary | ICD-10-CM | POA: Diagnosis not present

## 2019-08-25 ENCOUNTER — Other Ambulatory Visit: Payer: Self-pay | Admitting: Family Medicine

## 2019-09-04 DIAGNOSIS — M545 Low back pain: Secondary | ICD-10-CM | POA: Diagnosis not present

## 2019-09-04 DIAGNOSIS — G603 Idiopathic progressive neuropathy: Secondary | ICD-10-CM | POA: Diagnosis not present

## 2019-09-04 DIAGNOSIS — G5711 Meralgia paresthetica, right lower limb: Secondary | ICD-10-CM | POA: Diagnosis not present

## 2019-09-04 DIAGNOSIS — G250 Essential tremor: Secondary | ICD-10-CM | POA: Diagnosis not present

## 2019-09-09 ENCOUNTER — Other Ambulatory Visit: Payer: Self-pay | Admitting: Family Medicine

## 2019-09-09 DIAGNOSIS — E785 Hyperlipidemia, unspecified: Secondary | ICD-10-CM

## 2019-09-10 ENCOUNTER — Other Ambulatory Visit: Payer: Self-pay | Admitting: Family Medicine

## 2019-09-10 DIAGNOSIS — K219 Gastro-esophageal reflux disease without esophagitis: Secondary | ICD-10-CM

## 2019-09-17 ENCOUNTER — Other Ambulatory Visit: Payer: Self-pay | Admitting: Family Medicine

## 2019-09-17 DIAGNOSIS — J302 Other seasonal allergic rhinitis: Secondary | ICD-10-CM

## 2019-09-19 ENCOUNTER — Other Ambulatory Visit: Payer: Self-pay | Admitting: Family Medicine

## 2019-09-19 DIAGNOSIS — Z23 Encounter for immunization: Secondary | ICD-10-CM | POA: Diagnosis not present

## 2019-09-19 MED ORDER — UMECLIDINIUM BROMIDE 62.5 MCG/INH IN AEPB: 1.0000 | INHALATION_SPRAY | Freq: Every day | RESPIRATORY_TRACT | 3 refills | Status: DC

## 2019-09-25 DIAGNOSIS — J449 Chronic obstructive pulmonary disease, unspecified: Secondary | ICD-10-CM | POA: Diagnosis not present

## 2019-09-25 DIAGNOSIS — E785 Hyperlipidemia, unspecified: Secondary | ICD-10-CM | POA: Diagnosis not present

## 2019-09-25 DIAGNOSIS — K219 Gastro-esophageal reflux disease without esophagitis: Secondary | ICD-10-CM | POA: Diagnosis not present

## 2019-09-25 DIAGNOSIS — R251 Tremor, unspecified: Secondary | ICD-10-CM | POA: Diagnosis not present

## 2019-09-25 DIAGNOSIS — I421 Obstructive hypertrophic cardiomyopathy: Secondary | ICD-10-CM | POA: Diagnosis not present

## 2019-09-25 DIAGNOSIS — G6289 Other specified polyneuropathies: Secondary | ICD-10-CM | POA: Diagnosis not present

## 2019-09-25 DIAGNOSIS — H903 Sensorineural hearing loss, bilateral: Secondary | ICD-10-CM | POA: Diagnosis not present

## 2019-09-28 DIAGNOSIS — M545 Low back pain: Secondary | ICD-10-CM | POA: Diagnosis not present

## 2019-10-02 ENCOUNTER — Other Ambulatory Visit: Payer: Self-pay | Admitting: Family Medicine

## 2019-11-04 ENCOUNTER — Encounter: Payer: Medicare HMO | Admitting: Family Medicine

## 2019-11-04 NOTE — Progress Notes (Signed)
Virtual Visit via Telephone Note   This visit type was conducted due to national recommendations for restrictions regarding the COVID-19 Pandemic (e.g. social distancing) in an effort to limit this patient's exposure and mitigate transmission in our community.  Due to his co-morbid illnesses, this patient is at least at moderate risk for complications without adequate follow up.  This format is felt to be most appropriate for this patient at this time.  All issues noted in this document were discussed and addressed.  A limited physical exam was performed with this format.  Please refer to the patient's chart for his consent to telehealth for [facility]   Evaluation Performed:  Follow-up visit  This visit type was conducted due to national recommendations for restrictions regarding the COVID-19 Pandemic (e.g. social distancing).  This format is felt to be most appropriate for this patient at this time.  All issues noted in this document were discussed and addressed.  No physical exam was performed (except for noted visual exam findings with Video Visits).  Please refer to the patient's chart (MyChart message for video visits and phone note for telephone visits) for the patient's consent to telehealth for [facility].  Date:  11/05/2019   ID:  Warren Baker, DOB 03-15-1951, MRN [id number]  Patient Location:  Home  Provider location:   [place]  PCP:  Sharlene Dory, DO  Cardiologist:  Armanda Magic, MD  Electrophysiologist:  None   Chief Complaint:  HOCM, HTN, OSA  History of Present Illness:    Warren Baker is a 68 y.o. male who presents via audio/video conferencing for a telehealth visit today.    Warren Baker is a 68 y.o. male with a hx of mild OSA with an AHI of 11.3/hr and is on CPAP therapy.  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 He has an uncle that had SCD  and a brother who had a cardiac problem and got an AICD at 67yo in setting of MI.He was referred to EP but felt that AICD was not indicated at this time.    He is here today for followup and is doing well.  He says that he occasionally has some CP on occasion when he exerts himself to much or gets stressed.  He has chronic DOE from COPD and made with worse with having to wear a mask due to COVID.  He denies PND, orthopnea, LE edema, dizziness (except for bending over and getting up too fast), palpitations or syncope. He is compliant with his meds and is tolerating meds with no SE.  He is doing well with his CPAP device and thinks that he has gotten used to it.  He tolerates the mask except when it leaks and feels the pressure is adequate.  Since going on CPAP he feels rested in the am and has no significant daytime sleepiness.  He does have mouth and nose dryness when he wakes up.  He does not think that he snores.    The patient does not have symptoms concerning for COVID-19 infection (fever, chills, cough, or new shortness of breath).   Prior CV studies:   The following studies were reviewed today:  PAP compliance download  Past Medical History:  Diagnosis Date  . Arthritis    "legs, back, shoulders" (01/25/2018)  . Chronic back pain    "misalligned discs" (01/25/2018)  . COPD (chronic obstructive pulmonary disease) (  HCC)   . Excessive daytime sleepiness 07/13/2016  . Family history of adverse reaction to anesthesia    "think my mom had a hard time waking up one time" (01/25/2018)  . GERD (gastroesophageal reflux disease)   . High cholesterol   . History of kidney stones   . 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 OSA on CPAP 11/01/2016   Mild with AHI 11/hr  . Seasonal allergies   . Tremors of nervous system    Past Surgical History:  Procedure Laterality Date  . COLONOSCOPY    . COLONOSCOPY W/ BIOPSIES AND  POLYPECTOMY  ~ 2016  . MAXILLARY ANTROSTOMY Bilateral 01/25/2018   Procedure: BILATERAL MAXILLARY ANTROSTOMY;  Surgeon: Graylin Shiver, MD;  Location: [facility] OR;  Service: ENT;  Laterality: Bilateral;  . NASAL TURBINATE REDUCTION Bilateral 01/25/2018  . SINUS ENDO W/FUSION Bilateral 01/25/2018   Procedure: ENDOSCOPIC SINUS SURGERY WITH NAVIGATION;  Surgeon: Graylin Shiver, MD;  Location: [facility] OR;  Service: ENT;  Laterality: Bilateral;  . TONSILLECTOMY AND ADENOIDECTOMY    . TURBINATE REDUCTION Bilateral 01/25/2018   Procedure: BILATERAL TURBINATE REDUCTION;  Surgeon: Graylin Shiver, MD;  Location: [facility] OR;  Service: ENT;  Laterality: Bilateral;  . WISDOM TOOTH EXTRACTION       Current Meds  Medication Sig  . albuterol (PROVENTIL) (5 MG/ML) 0.5% nebulizer solution Take 0.5 mLs (2.5 mg total) by nebulization every 6 (six) hours as needed for wheezing or shortness of breath.  Marland Kitchen albuterol (VENTOLIN HFA) 108 (90 Base) MCG/ACT inhaler INHALE 2 PUFFS INTO THE LUNGS EVERY 6 (SIX) HOURS AS NEEDED FOR WHEEZING OR SHORTNESS OF BREATH.  Marland Kitchen Ascorbic Acid (VITAMIN C) 1000 MG tablet Take 1,000 mg by mouth daily.  Marland Kitchen aspirin 81 MG tablet Take 81 mg by mouth daily.  Marland Kitchen atorvastatin (LIPITOR) 10 MG tablet TAKE 1 TABLET EVERY DAY  . Cholecalciferol (VITAMIN D-3) 1000 UNITS CAPS Take 1,000 Units by mouth daily.  . cyclobenzaprine (FLEXERIL) 10 MG tablet Take 0.5-1 tablets (5-10 mg total) by mouth 3 (three) times daily as needed for muscle spasms.  . diclofenac (VOLTAREN) 75 MG EC tablet Take 75 mg by mouth 3 (three) times daily.  . fluticasone (FLONASE) 50 MCG/ACT nasal spray Place 2 sprays into both nostrils daily.  Marland Kitchen gabapentin (NEURONTIN) 800 MG tablet Take 800 mg by mouth 5 (five) times daily.   Marland Kitchen ibuprofen (ADVIL,MOTRIN) 200 MG tablet Take 200 mg by mouth every 6 (six) hours as needed for mild pain.  Marland Kitchen ipratropium (ATROVENT) 0.03 % nasal spray Place 2 sprays into both nostrils every 12 (twelve) hours.  Marland Kitchen  levocetirizine (XYZAL) 5 MG tablet TAKE 1 TABLET EVERY EVENING  . montelukast (SINGULAIR) 10 MG tablet TAKE 1 TABLET AT BEDTIME  . omeprazole (PRILOSEC) 20 MG capsule TAKE 1 CAPSULE EVERY DAY  . primidone (MYSOLINE) 50 MG tablet TAKE 3 TABLETS TWICE DAILY (Patient taking differently: Taking 2 tablets twice per day)  . propranolol (INDERAL) 40 MG tablet Take 1 tab in AM and 1/2 tab in PM. (Patient taking differently: Take 1/2 tablet three times per day)  . sildenafil (VIAGRA) 100 MG tablet Take 0.5-1 tablets (50-100 mg total) by mouth daily as needed for erectile dysfunction.  . tamsulosin (FLOMAX) 0.4 MG CAPS capsule TAKE 1 CAPSULE AT BEDTIME  . topiramate (TOPAMAX) 25 MG tablet Take 25 mg by mouth 2 (two) times daily. Take 1 tablet daily for 7 days  Take 1 tablet twice daily for 7 days Take 2 tablets twice daily  . umeclidinium bromide (INCRUSE ELLIPTA) 62.5 MCG/INH AEPB Inhale 1 puff into the lungs daily.  . vitamin B-12 (CYANOCOBALAMIN) 1000 MCG tablet Take 1,000 mcg by mouth daily.     Allergies:   Codeine and Propoxyphene   Social History   Tobacco Use  . Smoking status: Former Smoker    Packs/day: 2.00    Years: 30.00    Pack years: 60.00    Types: Cigarettes    Quit date: 07/1999    Years since quitting: 20.3  . Smokeless tobacco: Former Systems developer    Types: Snuff, Chew  Substance Use Topics  . Alcohol use: Yes    Comment: 01/25/2018 "might have wine 3 times/year"  . Drug use: No     Family Hx: The patient's family history includes Alcoholism in his father; Allergies in his daughter; Arthritis in his father and sister; Breast cancer in his maternal aunt; Cancer in an other family member; Congestive Heart Failure (age of onset: 27) in his brother; Congestive Heart Failure (age of onset: 74) in his mother; Diabetes in his maternal aunt; Emphysema in his brother; Gallbladder disease in his daughter; Heart attack in his brother; Heart disease in his brother and mother; Kidney Stones in  his daughter; Lung cancer in his maternal uncle; Migraines in his daughter; Stroke in his maternal aunt.  ROS:   Please see the history of present illness.     All other systems reviewed and are negative.   Labs/Other Tests and Data Reviewed:    Recent Labs: 05/02/2019: ALT 17; BUN 16; Creat 1.01; Potassium 4.8; Sodium 140   Recent Lipid Panel Lab Results  Component Value Date/Time   CHOL 116 05/02/2019 02:44 PM   TRIG 134 05/02/2019 02:44 PM   HDL 33 (L) 05/02/2019 02:44 PM   CHOLHDL 3.5 05/02/2019 02:44 PM   LDLCALC 61 05/02/2019 02:44 PM    Wt Readings from Last 3 Encounters:  11/05/19 298 lb (135.2 kg)  06/17/19 (!) 307 lb (139.3 kg)  05/02/19 (!) 309 lb (140.2 kg)     Objective:    Vital Signs:  Ht 6\' 5"  (1.956 m)   Wt 298 lb (135.2 kg)   BMI 35.34 kg/m    ASSESSMENT & PLAN:    1.  HOCM -apical Variant -he denies any syncope, palpitations, CP or SOB -felt not to be a candidate for AICD per EP  2.  HTN -BP controlled and is running 130/54mmHg -continue Inderal 20mg  TID  3.  OSA -  The patient is tolerating PAP therapy well without any problems. The PAP download was reviewed today and showed an AHI of 1.6/hr on 11 cm H2O with 87% compliance in using more than 4 hours nightly.  The patient has been using and benefiting from PAP use and will continue to benefit from therapy.  I have encouraged him to turn up his humidity for dry mouth.   4. Morbid  Obesity -His BMI is > 35 with cardiac co morbidities -I have encouraged him to get into a routine exercise program and cut back on carbs and portions.   COVID-19 Education: The signs and symptoms of COVID-19 were discussed with the patient and how to seek care for testing (follow up with PCP or arrange E-visit).  The importance of social distancing was discussed today.  Patient Risk:   After full review of this patient's clinical status, I feel that they are at least  moderate risk at this time.  Time:   Today, I  have spent 20 minutes  on telemedicine discussing medical problems including OSA, HTN, HOCM.  We also reviewed the symptoms of COVID 19 and the ways to protect against contracting the virus with telehealth technology.  I spent an additional 5 minutes reviewing patient's chart including PAP compliance download.  Medication Adjustments/Labs and Tests Ordered: Current medicines are reviewed at length with the patient today.  Concerns regarding medicines are outlined above.  Tests Ordered: No orders of the defined types were placed in this encounter.  Medication Changes: No orders of the defined types were placed in this encounter.   Disposition:  Follow up in 1 year(s)  Signed, Armanda Magicraci Marsela Kuan, MD  11/05/2019 10:32 AM    [facility]

## 2019-11-05 ENCOUNTER — Telehealth (INDEPENDENT_AMBULATORY_CARE_PROVIDER_SITE_OTHER): Payer: Medicare HMO | Admitting: Cardiology

## 2019-11-05 ENCOUNTER — Other Ambulatory Visit: Payer: Self-pay

## 2019-11-05 ENCOUNTER — Telehealth: Payer: Self-pay | Admitting: Cardiology

## 2019-11-05 ENCOUNTER — Encounter: Payer: Self-pay | Admitting: Cardiology

## 2019-11-05 VITALS — Ht 77.0 in | Wt 298.0 lb

## 2019-11-05 DIAGNOSIS — I421 Obstructive hypertrophic cardiomyopathy: Secondary | ICD-10-CM

## 2019-11-05 DIAGNOSIS — G4733 Obstructive sleep apnea (adult) (pediatric): Secondary | ICD-10-CM

## 2019-11-05 DIAGNOSIS — E669 Obesity, unspecified: Secondary | ICD-10-CM | POA: Diagnosis not present

## 2019-11-05 DIAGNOSIS — I10 Essential (primary) hypertension: Secondary | ICD-10-CM | POA: Diagnosis not present

## 2019-11-05 NOTE — Patient Instructions (Signed)

## 2019-11-05 NOTE — Telephone Encounter (Signed)
Patient returning Warren Baker's phone call in regards to his virtual appt today at 10:20am with Dr. Radford Pax.

## 2019-11-17 ENCOUNTER — Ambulatory Visit: Payer: Medicare HMO | Admitting: Family Medicine

## 2019-11-24 ENCOUNTER — Other Ambulatory Visit: Payer: Self-pay | Admitting: Family Medicine

## 2019-12-06 ENCOUNTER — Other Ambulatory Visit: Payer: Self-pay | Admitting: Family Medicine

## 2019-12-06 DIAGNOSIS — J302 Other seasonal allergic rhinitis: Secondary | ICD-10-CM

## 2019-12-23 ENCOUNTER — Other Ambulatory Visit: Payer: Self-pay | Admitting: Family Medicine

## 2019-12-24 ENCOUNTER — Other Ambulatory Visit: Payer: Self-pay | Admitting: Family Medicine

## 2019-12-24 DIAGNOSIS — R251 Tremor, unspecified: Secondary | ICD-10-CM

## 2019-12-25 ENCOUNTER — Encounter: Payer: Self-pay | Admitting: Gastroenterology

## 2019-12-26 ENCOUNTER — Other Ambulatory Visit: Payer: Self-pay | Admitting: Family Medicine

## 2020-01-01 ENCOUNTER — Ambulatory Visit: Payer: Medicare HMO | Admitting: Gastroenterology

## 2020-01-01 ENCOUNTER — Other Ambulatory Visit: Payer: Self-pay

## 2020-01-01 ENCOUNTER — Encounter: Payer: Self-pay | Admitting: Gastroenterology

## 2020-01-01 VITALS — BP 124/64 | HR 45 | Temp 97.3°F | Ht 77.0 in | Wt 304.4 lb

## 2020-01-01 DIAGNOSIS — E669 Obesity, unspecified: Secondary | ICD-10-CM

## 2020-01-01 DIAGNOSIS — K921 Melena: Secondary | ICD-10-CM

## 2020-01-01 DIAGNOSIS — Z6836 Body mass index (BMI) 36.0-36.9, adult: Secondary | ICD-10-CM

## 2020-01-01 DIAGNOSIS — R143 Flatulence: Secondary | ICD-10-CM

## 2020-01-01 DIAGNOSIS — K429 Umbilical hernia without obstruction or gangrene: Secondary | ICD-10-CM

## 2020-01-01 DIAGNOSIS — Z86010 Personal history of colonic polyps: Secondary | ICD-10-CM

## 2020-01-01 DIAGNOSIS — Z01818 Encounter for other preprocedural examination: Secondary | ICD-10-CM | POA: Diagnosis not present

## 2020-01-01 NOTE — Progress Notes (Signed)
Chief Complaint: Colon cancer screening, symptomatic hemorrhoids, increased gas/flatus  Referring Provider:     Sharlene Dory, DO   HPI:    Warren Baker is a 69 y.o. male with a history of COPD, GERD, hyperlipidemia, HTN, OSA (CPAP), HOCM, obesity (BMI 36.1), referred to the Gastroenterology Clinic for evaluation of lower GI symptoms along with ongoing CRC screening/polyp surveillance.  Occassional scant BRBPR that he has attributed to hemorrhoids. Otherwise, no changes in bowel habits, n/v/f/c/d/c, weight loss, night sweats.  Can intermittently have no BM x2-3 days, but this has been the case for as long as he can remember.  No straining to have BM.  Increased flatus last 2-4 months, independent of food types.  No dietary changes. No abdominal pain. Occasional gas pressure, improved with flatus/BM.  Separately, has a umbilical hernia that has been present for essentially his entire adult life.  Minimally bothersome.  No pain.  No history of incarcerated bowel.  Colonoscopy completed 2016 at [facility]-no report available for review. Patient reports was n/f for precancerous polyps.   Follows in the Cardiology Clinic, last seen in 10/2019.  Reviewed most recent labs from 04/2019: Normal CMP.  No recent abdominal imaging for review.  No known family history of CRC, GI malignancy, liver disease, pancreatic disease, or IBD.    Past Medical History:  Diagnosis Date  . Arthritis    "legs, back, shoulders" (01/25/2018)  . Chronic back pain    "misalligned discs" (01/25/2018)  . COPD (chronic obstructive pulmonary disease) (HCC)   . Excessive daytime sleepiness 07/13/2016  . Family history of adverse reaction to anesthesia    "think my mom had a hard time waking up one time" (01/25/2018)  . GERD (gastroesophageal reflux disease)   . High cholesterol   . History of colon polyps   . History of kidney stones   . HOCM (hypertrophic obstructive cardiomyopathy) (HCC)     apical variant by echo with no history of syncope  . OSA on CPAP 11/01/2016   Mild with AHI 11/hr  . Seasonal allergies   . Tremors of nervous system      Past Surgical History:  Procedure Laterality Date  . COLONOSCOPY    . COLONOSCOPY W/ BIOPSIES AND POLYPECTOMY  ~ 2016  . MAXILLARY ANTROSTOMY Bilateral 01/25/2018   Procedure: BILATERAL MAXILLARY ANTROSTOMY;  Surgeon: Graylin Shiver, MD;  Location: [facility] OR;  Service: ENT;  Laterality: Bilateral;  . NASAL TURBINATE REDUCTION Bilateral 01/25/2018  . SINUS ENDO W/FUSION Bilateral 01/25/2018   Procedure: ENDOSCOPIC SINUS SURGERY WITH NAVIGATION;  Surgeon: Graylin Shiver, MD;  Location[facility] OR;  Service: ENT;  Laterality: Bilateral;  . TONSILLECTOMY AND ADENOIDECTOMY    . TURBINATE REDUCTION Bilateral 01/25/2018   Procedure: BILATERAL TURBINATE REDUCTION;  Surgeon: Graylin Shiver, MD;  Location: [facility] OR;  Service: ENT;  Laterality: Bilateral;  . WISDOM TOOTH EXTRACTION     Family History  Problem Relation Age of Onset  . Heart disease Mother   . Congestive Heart Failure Mother 42       Deceased  . Alcoholism Father        Living  . Arthritis Father   . Diabetes Maternal Aunt   . Cancer Other        PGGM  . Breast cancer Maternal Aunt   . Lung cancer Maternal Uncle   . Heart disease Brother   . Heart attack Brother   .  Congestive Heart Failure Brother 17       Deceased  . Stroke Maternal Aunt   . Emphysema Brother        #2  . Arthritis Sister        #1  . Allergies Daughter   . Kidney Stones Daughter   . Gallbladder disease Daughter   . Migraines Daughter   . Colon cancer Neg Hx   . Esophageal cancer Neg Hx    Social History   Tobacco Use  . Smoking status: Former Smoker    Packs/day: 2.00    Years: 30.00    Pack years: 60.00    Types: Cigarettes    Quit date: 07/1999    Years since quitting: 20.4  . Smokeless tobacco: Former Systems developer    Types: Snuff, Chew  Substance Use Topics  . Alcohol use: Yes     Comment: 01/25/2018 "might have wine 3 times/year"  . Drug use: No   Current Outpatient Medications  Medication Sig Dispense Refill  . albuterol (VENTOLIN HFA) 108 (90 Base) MCG/ACT inhaler INHALE 2 PUFFS INTO THE LUNGS EVERY 6 (SIX) HOURS AS NEEDED FOR WHEEZING OR SHORTNESS OF BREATH. 18 g 3  . Ascorbic Acid (VITAMIN C) 1000 MG tablet Take 1,000 mg by mouth daily.    Marland Kitchen aspirin 81 MG tablet Take 81 mg by mouth daily.    Marland Kitchen atorvastatin (LIPITOR) 10 MG tablet TAKE 1 TABLET EVERY DAY 90 tablet 1  . Cholecalciferol (VITAMIN D-3) 1000 UNITS CAPS Take 1,000 Units by mouth daily.    . cyclobenzaprine (FLEXERIL) 10 MG tablet Take 0.5-1 tablets (5-10 mg total) by mouth 3 (three) times daily as needed for muscle spasms. 15 tablet 0  . diclofenac (VOLTAREN) 75 MG EC tablet Take 75 mg by mouth 3 (three) times daily.    . fluticasone (FLONASE) 50 MCG/ACT nasal spray SPRAY 2 SPRAYS INTO EACH NOSTRIL EVERY DAY (Patient taking differently: Place 2 sprays into both nostrils as needed. ) 48 mL 1  . gabapentin (NEURONTIN) 800 MG tablet Take 800 mg by mouth 5 (five) times daily.     Marland Kitchen ibuprofen (ADVIL,MOTRIN) 200 MG tablet Take 200 mg by mouth every 6 (six) hours as needed for mild pain.    . INCRUSE ELLIPTA 62.5 MCG/INH AEPB TAKE 1 PUFF BY MOUTH EVERY DAY 30 each 5  . ipratropium (ATROVENT) 0.03 % nasal spray Place 2 sprays into both nostrils every 12 (twelve) hours. 30 mL 12  . levocetirizine (XYZAL) 5 MG tablet TAKE 1 TABLET EVERY EVENING 90 tablet 1  . montelukast (SINGULAIR) 10 MG tablet TAKE 1 TABLET AT BEDTIME 90 tablet 1  . omeprazole (PRILOSEC) 20 MG capsule TAKE 1 CAPSULE EVERY DAY 90 capsule 3  . primidone (MYSOLINE) 250 MG tablet Take 250 mg by mouth 2 (two) times daily.    . propranolol (INDERAL) 40 MG tablet TAKE 1 TABLET IN THE MORNING AND TAKE 1/2 TABLET IN THE EVENING 135 tablet 3  . tamsulosin (FLOMAX) 0.4 MG CAPS capsule TAKE 1 CAPSULE AT BEDTIME 90 capsule 1  . topiramate (TOPAMAX) 25 MG tablet  Take 25 mg by mouth 2 (two) times daily.     . vitamin B-12 (CYANOCOBALAMIN) 1000 MCG tablet Take 1,000 mcg by mouth daily.    Marland Kitchen albuterol (PROVENTIL) (5 MG/ML) 0.5% nebulizer solution Take 0.5 mLs (2.5 mg total) by nebulization every 6 (six) hours as needed for wheezing or shortness of breath. (Patient not taking: Reported on 01/01/2020) 20 mL 0  .  sildenafil (VIAGRA) 100 MG tablet Take 0.5-1 tablets (50-100 mg total) by mouth daily as needed for erectile dysfunction. (Patient not taking: Reported on 01/01/2020) 10 tablet 2  . SPIRIVA HANDIHALER 18 MCG inhalation capsule      No current facility-administered medications for this visit.   Allergies  Allergen Reactions  . Codeine Nausea And Vomiting    All Codeine Related Drugs   . Propoxyphene Nausea And Vomiting     Review of Systems: All systems reviewed and negative except where noted in HPI.     Physical Exam:    Wt Readings from Last 3 Encounters:  01/01/20 (!) 304 lb 6 oz (138.1 kg)  11/05/19 298 lb (135.2 kg)  06/17/19 (!) 307 lb (139.3 kg)    BP 124/64   Pulse (!) 45   Temp (!) 97.3 F (36.3 C)   Ht 6\' 5"  (1.956 m)   Wt (!) 304 lb 6 oz (138.1 kg)   BMI 36.09 kg/m  Constitutional:  Pleasant, in no acute distress. Psychiatric: Normal mood and affect. Behavior is normal. EENT: Pupils normal.  Conjunctivae are normal. No scleral icterus. Neck supple. No cervical LAD. Cardiovascular: Normal rate, regular rhythm. No edema Pulmonary/chest: Effort normal and breath sounds normal. No wheezing, rales or rhonchi. Abdominal: Umbilical hernia.  Soft, nondistended, nontender. Bowel sounds active throughout. There are no masses palpable. No hepatomegaly. Neurological: Alert and oriented to person place and time. Skin: Skin is warm and dry. No rashes noted.   ASSESSMENT AND PLAN;   1) History of colon polyps: -Colonoscopy completed 2016 at outside facility.  No report available for review, but was notable for polyps. -Plan  for repeat colonoscopy now given 5-year interval from last colonoscopy and what sounds to be adenomatous polyps along with evaluation for active lower GI symptoms as below  2) Hematochezia 3) Increased flatus -Intermittent scant BRBPR likely anorectal pathology (i.e. hemorrhoids).  However, given age, history of polyps, etc., certainly prudent for colonoscopy to rule out more proximal pathology -Start fiber supplement (i.e. Benefiber, Citrucel) -Resume adequate hydration  4) Umbilical hernia 5) Obesity (BMI 36.1) -Long history of umbilical hernia.  No recent changes.  He may consider surgical referral in the future, but he is currently attempting to lose weight to see if this can improve hernia -Discussed relationship of obesity with GI issues along with overall health benefits of significant weight loss  The indications, risks, and benefits of colonoscopy were explained to the patient in detail. Risks include but are not limited to bleeding, perforation, adverse reaction to medications, and cardiopulmonary compromise. Sequelae include but are not limited to the possibility of surgery, hospitalization, and mortality. The patient verbalized understanding and wished to proceed. All questions answered, referred to the scheduler and bowel prep ordered. Further recommendations pending results of the exam.     2017, DO, FACG  01/01/2020, 9:37 AM   02/29/2020, Carmelia Roller*

## 2020-01-01 NOTE — Patient Instructions (Signed)
If you are age 68 or older, your body mass index should be between 23-30. Your Body mass index is 36.09 kg/m. If this is out of the aforementioned range listed, please consider follow up with your Primary Care Provider.  If you are age 14 or younger, your body mass index should be between 19-25. Your Body mass index is 36.09 kg/m. If this is out of the aformentioned range listed, please consider follow up with your Primary Care Provider.   You have been scheduled for a colonoscopy. Please follow written instructions given to you at your visit today.  Please pick up your prep supplies at the pharmacy within the next 1-3 days. If you use inhalers (even only as needed), please bring them with you on the day of your procedure. Your physician has requested that you go to www.startemmi.com and enter the access code given to you at your visit today. This web site gives a general overview about your procedure. However, you should still follow specific instructions given to you by our office regarding your preparation for the procedure.  You have been given a Sample of Clenpiq.  Start Benefiber or Citrucel (generic) this is over-the-counter.  Thank you for choosing me and [facility].   Doristine Locks, MD

## 2020-01-07 ENCOUNTER — Telehealth: Payer: Self-pay | Admitting: Gastroenterology

## 2020-01-07 NOTE — Telephone Encounter (Signed)
Colonoscopy notes from digestive health specialists reviewed and notable for the following: -Colonoscopy (04/02/2015): 5 mm polyp near ICV (tubular adenoma), 4 mm polyp in ascending colon (tubular adenoma).  Good prep.  Moderate diverticulosis.  Small internal hemorrhoids.    Based on these findings, no change to current plan for repeat colonoscopy for polyp surveillance as previously discussed.

## 2020-01-19 ENCOUNTER — Other Ambulatory Visit: Payer: Self-pay | Admitting: Family Medicine

## 2020-01-21 ENCOUNTER — Other Ambulatory Visit: Payer: Self-pay | Admitting: Family Medicine

## 2020-01-21 DIAGNOSIS — E785 Hyperlipidemia, unspecified: Secondary | ICD-10-CM

## 2020-01-21 DIAGNOSIS — J302 Other seasonal allergic rhinitis: Secondary | ICD-10-CM

## 2020-01-22 NOTE — Telephone Encounter (Signed)
Dr Carmelia Roller / robin -- Refilled pt's xyzal and atorvastatin. Pt was last seen in July and has no future appts scheduled. When is he due for follow up?

## 2020-01-22 NOTE — Telephone Encounter (Signed)
He was originally scheduled in Dec. I would say f/u for CPE in the next month or so, nothing urgent though. Ty.

## 2020-01-22 NOTE — Telephone Encounter (Signed)
Spoke with pt. He is currently at Urology office and unable to talk. Sent [employer].

## 2020-01-28 ENCOUNTER — Ambulatory Visit (INDEPENDENT_AMBULATORY_CARE_PROVIDER_SITE_OTHER): Payer: Medicare HMO

## 2020-01-28 ENCOUNTER — Other Ambulatory Visit: Payer: Self-pay | Admitting: Gastroenterology

## 2020-01-28 DIAGNOSIS — Z1159 Encounter for screening for other viral diseases: Secondary | ICD-10-CM

## 2020-01-28 LAB — SARS CORONAVIRUS 2 (TAT 6-24 HRS): SARS Coronavirus 2: NEGATIVE

## 2020-01-30 ENCOUNTER — Other Ambulatory Visit: Payer: Self-pay

## 2020-01-30 ENCOUNTER — Encounter: Payer: Self-pay | Admitting: Gastroenterology

## 2020-01-30 ENCOUNTER — Ambulatory Visit (AMBULATORY_SURGERY_CENTER): Payer: Medicare HMO | Admitting: Gastroenterology

## 2020-01-30 VITALS — BP 128/82 | HR 47 | Temp 93.7°F | Resp 17 | Ht 77.0 in | Wt 304.0 lb

## 2020-01-30 DIAGNOSIS — K5730 Diverticulosis of large intestine without perforation or abscess without bleeding: Secondary | ICD-10-CM

## 2020-01-30 DIAGNOSIS — K641 Second degree hemorrhoids: Secondary | ICD-10-CM | POA: Diagnosis not present

## 2020-01-30 DIAGNOSIS — K5989 Other specified functional intestinal disorders: Secondary | ICD-10-CM | POA: Diagnosis not present

## 2020-01-30 DIAGNOSIS — K921 Melena: Secondary | ICD-10-CM

## 2020-01-30 DIAGNOSIS — Z86010 Personal history of colonic polyps: Secondary | ICD-10-CM

## 2020-01-30 MED ORDER — SODIUM CHLORIDE 0.9 % IV SOLN: 500.0000 mL | INTRAVENOUS | Status: DC

## 2020-01-30 NOTE — Op Note (Signed)
[facility] Patient Name: Warren Baker Procedure Date: 01/30/2020 11:55 AM MRN: [id number] Endoscopist: Doristine Locks , MD Age: 69 Referring MD:  Date of Birth: Mar 11, 1951 Gender: Male Account #: [id number] Procedure:                Colonoscopy Indications:              Surveillance: Personal history of adenomatous                            polyps on last colonoscopy 5 years ago                           Colonoscopy at outside facility in 2016 with 2                            subcentimeter tubular adenomas, with recommendation                            to repeat in 5 years. Additionally, he has                            intermittent hematochezia and increased flatus. Medicines:                Monitored Anesthesia Care Procedure:                Pre-Anesthesia Assessment:                           - Prior to the procedure, a History and Physical                            was performed, and patient medications and                            allergies were reviewed. The patient's tolerance of                            previous anesthesia was also reviewed. The risks                            and benefits of the procedure and the sedation                            options and risks were discussed with the patient.                            All questions were answered, and informed consent                            was obtained. Prior Anticoagulants: The patient has                            taken no previous anticoagulant or antiplatelet  agents. ASA Grade Assessment: III - A patient with                            severe systemic disease. After reviewing the risks                            and benefits, the patient was deemed in                            satisfactory condition to undergo the procedure.                           After obtaining informed consent, the colonoscope                            was passed under direct vision.  Throughout the                            procedure, the patient's blood pressure, pulse, and                            oxygen saturations were monitored continuously. The                            Colonoscope was introduced through the anus and                            advanced to the the cecum, identified by                            appendiceal orifice and ileocecal valve. The                            colonoscopy was performed without difficulty. The                            patient tolerated the procedure well. The quality                            of the bowel preparation was fair. The ileocecal                            valve, appendiceal orifice, and rectum were                            photographed. Scope In: 11:59:29 AM Scope Out: 12:15:41 PM Scope Withdrawal Time: 0 hours 12 minutes 49 seconds  Total Procedure Duration: 0 hours 16 minutes 12 seconds  Findings:                 Hemorrhoids were found on perianal exam.                           Multiple small and large-mouthed diverticula were  found in the entire colon.                           A localized area of mildly erythematous mucosa and                            mucosal hypertrophy was found in the sigmoid colon                            in an area of dense diverticulosis. Biopsies were                            taken with a cold forceps for histology. Estimated                            blood loss was minimal.                           A moderate amount of semi-solid stool was found in                            the rectum, in the sigmoid colon, in the ascending                            colon and in the cecum, interfering with                            visualization. Lavage of the area was performed                            using copious amounts of sterile water, resulting                            in incomplete clearance with fair visualization.                             Colonoscope channel clogged a few times with                            attempted lavage and clearance due to solid food                            debris/stools. These areas could not be fully                            cleared. Cannot rule out the presence of small or                            flat polyps in these areas and therefore this study                            is considered incomplete from a colon cancer  screening/polyp surveillance standpoint. Complications:            No immediate complications. Estimated Blood Loss:     Estimated blood loss was minimal. Impression:               - Preparation of the colon was fair.                           - Hemorrhoids found on perianal exam.                           - Diverticulosis in the entire examined colon.                           - Erythematous, hypertrophied mucosa in the sigmoid                            colon. Biopsied.                           - Stool in the rectum, in the sigmoid colon, in the                            ascending colon and in the cecum. Recommendation:           - Patient has a contact number available for                            emergencies. The signs and symptoms of potential                            delayed complications were discussed with the                            patient. Return to normal activities tomorrow.                            Written discharge instructions were provided to the                            patient.                           - Resume previous diet.                           - Continue present medications.                           - Await pathology results.                           - Repeat colonoscopy in 2 years because the bowel                            preparation was suboptimal.                           -  Use fiber, for example Citrucel, Fibercon, Konsyl                            or Metamucil.                           - Return  to GI clinic PRN. Doristine Locks, MD 01/30/2020 12:24:05 PM

## 2020-01-30 NOTE — Progress Notes (Signed)
PT taken to PACU. Monitors in place. VSS. Report given to RN. 

## 2020-01-30 NOTE — Progress Notes (Signed)
Called to room to assist during endoscopic procedure.  Patient ID and intended procedure confirmed with present staff. Received instructions for my participation in the procedure from the performing physician.  

## 2020-01-30 NOTE — Patient Instructions (Signed)
Read all of the handouts given to you by your recovery room nurse.  You will need another colonoscopy in 2 years.  YOU HAD AN ENDOSCOPIC PROCEDURE TODAY AT THE [facility]:   Refer to the procedure report that was given to you for any specific questions about what was found during the examination.  If the procedure report does not answer your questions, please call your gastroenterologist to clarify.  If you requested that your care partner not be given the details of your procedure findings, then the procedure report has been included in a sealed envelope for you to review at your convenience later.  YOU SHOULD EXPECT: Some feelings of bloating in the abdomen. Passage of more gas than usual.  Walking can help get rid of the air that was put into your GI tract during the procedure and reduce the bloating. If you had a lower endoscopy (such as a colonoscopy or flexible sigmoidoscopy) you may notice spotting of blood in your stool or on the toilet paper. If you underwent a bowel prep for your procedure, you may not have a normal bowel movement for a few days.  Please Note:  You might notice some irritation and congestion in your nose or some drainage.  This is from the oxygen used during your procedure.  There is no need for concern and it should clear up in a day or so.  SYMPTOMS TO REPORT IMMEDIATELY:   Following lower endoscopy (colonoscopy or flexible sigmoidoscopy):  Excessive amounts of blood in the stool  Significant tenderness or worsening of abdominal pains  Swelling of the abdomen that is new, acute  Fever of 100F or higher   For urgent or emergent issues, a gastroenterologist can be reached at any hour by calling [phone]. Do not use MyChart messaging for urgent concerns.    DIET:  We do recommend a small meal at first, but then you may proceed to your regular diet.  Drink plenty of fluids but you should avoid alcoholic beverages for 24 hours. Try to increase the  fiber in your diet, and use a fiber supplement as mentioned in your report.  ACTIVITY:  You should plan to take it easy for the rest of today and you should NOT DRIVE or use heavy machinery until tomorrow (because of the sedation medicines used during the test).    FOLLOW UP: Our staff will call the number listed on your records 48-72 hours following your procedure to check on you and address any questions or concerns that you may have regarding the information given to you following your procedure. If we do not reach you, we will leave a message.  We will attempt to reach you two times.  During this call, we will ask if you have developed any symptoms of COVID 19. If you develop any symptoms (ie: fever, flu-like symptoms, shortness of breath, cough etc.) before then, please call [phone].  If you test positive for Covid 19 in the 2 weeks post procedure, please call and report this information to [place].    If any biopsies were taken you will be contacted by phone or by letter within the next 1-3 weeks.  Please call us at [phone] if you have not heard about the biopsies in 3 weeks.    SIGNATURES/CONFIDENTIALITY: You and/or your care partner have signed paperwork which will be entered into your electronic medical record.  These signatures attest to the fact that that the information above on your After Visit  Summary has been reviewed and is understood.  Full responsibility of the confidentiality of this discharge information lies with you and/or your care-partner. 

## 2020-02-03 ENCOUNTER — Telehealth: Payer: Self-pay | Admitting: *Deleted

## 2020-02-03 ENCOUNTER — Telehealth: Payer: Self-pay

## 2020-02-03 NOTE — Telephone Encounter (Signed)
  Follow up Call-  Call back number 01/30/2020  Post procedure Call Back phone  # [phone]  Permission to leave phone message Yes  Some recent data might be hidden     Patient questions:  Do you have a fever, pain , or abdominal swelling? No. Pain Score  0 *  Have you tolerated food without any problems? Yes.    Have you been able to return to your normal activities? Yes.    Do you have any questions about your discharge instructions: Diet   No. Medications  No. Follow up visit  No.  Do you have questions or concerns about your Care? Yes.  just worried because he hasnt had a bowel movement since his procedure. REassured the pt that this is normal post colonoscopy but that they should be returning to normal soon and to call if hes concerned.  Actions: * If pain score is 4 or above: No action needed, pain <4.  1. Have you developed a fever since your procedure? no  2.   Have you had an respiratory symptoms (SOB or cough) since your procedure? no  3.   Have you tested positive for COVID 19 since your procedure no  4.   Have you had any family members/close contacts diagnosed with the COVID 19 since your procedure?  no   If yes to any of these questions please route to Laverna Peace, RN and Jennye Boroughs, Charity fundraiser.

## 2020-02-03 NOTE — Telephone Encounter (Signed)
LVM

## 2020-02-06 ENCOUNTER — Encounter: Payer: Self-pay | Admitting: Gastroenterology

## 2020-02-06 NOTE — Telephone Encounter (Signed)
01/22/20 mychart message came back as unread. Mailed letter.

## 2020-11-01 ENCOUNTER — Ambulatory Visit: Payer: Medicare HMO | Admitting: Cardiology

## 2020-11-01 ENCOUNTER — Encounter: Payer: Self-pay | Admitting: Cardiology

## 2020-11-01 ENCOUNTER — Other Ambulatory Visit: Payer: Self-pay

## 2020-11-01 VITALS — BP 136/70 | HR 53 | Ht 77.0 in | Wt 315.0 lb

## 2020-11-01 DIAGNOSIS — G4733 Obstructive sleep apnea (adult) (pediatric): Secondary | ICD-10-CM | POA: Diagnosis not present

## 2020-11-01 DIAGNOSIS — I10 Essential (primary) hypertension: Secondary | ICD-10-CM | POA: Diagnosis not present

## 2020-11-01 DIAGNOSIS — I421 Obstructive hypertrophic cardiomyopathy: Secondary | ICD-10-CM | POA: Diagnosis not present

## 2020-11-01 NOTE — Patient Instructions (Addendum)
Medication Instructions:  Your physician recommends that you continue on your current medications as directed. Please refer to the Current Medication list given to you today.  *If you need a refill on your cardiac medications before your next appointment, please call your pharmacy*   Testing/Procedures: Your physician has requested that you have an echocardiogram. Echocardiography is a painless test that uses sound waves to create images of your heart. It provides your doctor with information about the size and shape of your heart and how well your heart's chambers and valves are working. This procedure takes approximately one hour. There are no restrictions for this procedure.   Follow-Up: At [facility], you and your health needs are our priority.  As part of our continuing mission to provide you with exceptional heart care, we have created designated Provider Care Teams.  These Care Teams include your primary Cardiologist (physician) and Advanced Practice Providers (APPs -  Physician Assistants and Nurse Practitioners) who all work together to provide you with the care you need, when you need it.   Your next appointment:   1 year(s)  The format for your next appointment:   In Person  Provider:   You may see Armanda Magic, MD or one of the following Advanced Practice Providers on your designated Care Team:    Ronie Spies, PA-C  Jacolyn Reedy, PA-C  You have been referred to the [facility] Program.   Other Instructions:  Low-Sodium Eating Plan Sodium, which is an element that makes up salt, helps you maintain a healthy balance of fluids in your body. Too much sodium can increase your blood pressure and cause fluid and waste to be held in your body. Your health care provider or dietitian may recommend following this plan if you have high blood pressure (hypertension), kidney disease, liver disease, or heart failure. Eating less sodium can help lower your blood  pressure, reduce swelling, and protect your heart, liver, and kidneys. What are tips for following this plan? General guidelines  Most people on this plan should limit their sodium intake to 1,500-2,000 mg (milligrams) of sodium each day. Reading food labels   The Nutrition Facts label lists the amount of sodium in one serving of the food. If you eat more than one serving, you must multiply the listed amount of sodium by the number of servings.  Choose foods with less than 140 mg of sodium per serving.  Avoid foods with 300 mg of sodium or more per serving. Shopping  Look for lower-sodium products, often labeled as "low-sodium" or "no salt added."  Always check the sodium content even if foods are labeled as "unsalted" or "no salt added".  Buy fresh foods. ? Avoid canned foods and premade or frozen meals. ? Avoid canned, cured, or processed meats  Buy breads that have less than 80 mg of sodium per slice. Cooking  Eat more home-cooked food and less restaurant, buffet, and fast food.  Avoid adding salt when cooking. Use salt-free seasonings or herbs instead of table salt or sea salt. Check with your health care provider or pharmacist before using salt substitutes.  Cook with plant-based oils, such as canola, sunflower, or olive oil. Meal planning  When eating at a restaurant, ask that your food be prepared with less salt or no salt, if possible.  Avoid foods that contain MSG (monosodium glutamate). MSG is sometimes added to [place] food, bouillon, and some canned foods. What foods are recommended? The items listed may not be a  complete list. Talk with your dietitian about what dietary choices are best for you. Grains Low-sodium cereals, including oats, puffed wheat and rice, and shredded wheat. Low-sodium crackers. Unsalted rice. Unsalted pasta. Low-sodium bread. Whole-grain breads and whole-grain pasta. Vegetables Fresh or frozen vegetables. "No salt added" canned vegetables.  "No salt added" tomato sauce and paste. Low-sodium or reduced-sodium tomato and vegetable juice. Fruits Fresh, frozen, or canned fruit. Fruit juice. Meats and other protein foods Fresh or frozen (no salt added) meat, poultry, seafood, and fish. Low-sodium canned tuna and salmon. Unsalted nuts. Dried peas, beans, and lentils without added salt. Unsalted canned beans. Eggs. Unsalted nut butters. Dairy Milk. Soy milk. Cheese that is naturally low in sodium, such as ricotta cheese, fresh mozzarella, or Swiss cheese Low-sodium or reduced-sodium cheese. Cream cheese. Yogurt. Fats and oils Unsalted butter. Unsalted margarine with no trans fat. Vegetable oils such as canola or olive oils. Seasonings and other foods Fresh and dried herbs and spices. Salt-free seasonings. Low-sodium mustard and ketchup. Sodium-free salad dressing. Sodium-free light mayonnaise. Fresh or refrigerated horseradish. Lemon juice. Vinegar. Homemade, reduced-sodium, or low-sodium soups. Unsalted popcorn and pretzels. Low-salt or salt-free chips. What foods are not recommended? The items listed may not be a complete list. Talk with your dietitian about what dietary choices are best for you. Grains Instant hot cereals. Bread stuffing, pancake, and biscuit mixes. Croutons. Seasoned rice or pasta mixes. Noodle soup cups. Boxed or frozen macaroni and cheese. Regular salted crackers. Self-rising flour. Vegetables Sauerkraut, pickled vegetables, and relishes. Olives. [place] fries. Onion rings. Regular canned vegetables (not low-sodium or reduced-sodium). Regular canned tomato sauce and paste (not low-sodium or reduced-sodium). Regular tomato and vegetable juice (not low-sodium or reduced-sodium). Frozen vegetables in sauces. Meats and other protein foods Meat or fish that is salted, canned, smoked, spiced, or pickled. Bacon, ham, sausage, hotdogs, corned beef, chipped beef, packaged lunch meats, salt pork, jerky, pickled herring,  anchovies, regular canned tuna, sardines, salted nuts. Dairy Processed cheese and cheese spreads. Cheese curds. Blue cheese. Feta cheese. String cheese. Regular cottage cheese. Buttermilk. Canned milk. Fats and oils Salted butter. Regular margarine. Ghee. Bacon fat. Seasonings and other foods Onion salt, garlic salt, seasoned salt, table salt, and sea salt. Canned and packaged gravies. Worcestershire sauce. Tartar sauce. Barbecue sauce. Teriyaki sauce. Soy sauce, including reduced-sodium. Steak sauce. Fish sauce. Oyster sauce. Cocktail sauce. Horseradish that you find on the shelf. Regular ketchup and mustard. Meat flavorings and tenderizers. Bouillon cubes. Hot sauce and Tabasco sauce. Premade or packaged marinades. Premade or packaged taco seasonings. Relishes. Regular salad dressings. Salsa. Potato and tortilla chips. Corn chips and puffs. Salted popcorn and pretzels. Canned or dried soups. Pizza. Frozen entrees and pot pies. Summary  Eating less sodium can help lower your blood pressure, reduce swelling, and protect your heart, liver, and kidneys.  Most people on this plan should limit their sodium intake to 1,500-2,000 mg (milligrams) of sodium each day.  Canned, boxed, and frozen foods are high in sodium. Restaurant foods, fast foods, and pizza are also very high in sodium. You also get sodium by adding salt to food.  Try to cook at home, eat more fresh fruits and vegetables, and eat less fast food, canned, processed, or prepared foods. This information is not intended to replace advice given to you by your health care provider. Make sure you discuss any questions you have with your health care provider. Document Revised: 10/19/2017 Document Reviewed: 10/30/2016 Elsevier Patient Education  2020 [employer].

## 2020-11-01 NOTE — Progress Notes (Signed)
Date:  11/01/2020   ID:  Warren Baker, DOB 1950-11-28, MRN [id number]  PCP:  Si Gaul, DO  Cardiologist:  Armanda Magic, MD  Electrophysiologist:  None   Chief Complaint:  HOCM, HTN, OSA  History of Present Illness:    Warren Baker is a 69 y.o. male  with a hx of mild OSA with an AHI of 11.3/hr and is on CPAP therapy.  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 He has an uncle that had SCD and a brother who had a cardiac problem and got an AICD at 69yo in setting of MI.He was referred to EP but felt that AICD was not indicated at this time.    He is here today for followup and is doing well.  He has chronic DOE from COPD.  He also has noticed some LE edema recently.  He denies any chest pain or pressure,  PND, orthopnea, dizziness, palpitations or syncope. He is compliant with his meds and is tolerating meds with no SE.    He is doing well with his CPAP device and thinks that he has gotten used to it.  He tolerates the mask and feels the pressure is adequate.  Since going on CPAP he feels rested in the am and has no significant daytime sleepiness.  He denies any significant mouth or nasal dryness or nasal congestion.  He does not think that he snores.     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 by echo with no history of syncope   OSA on CPAP 11/01/2016   Mild with AHI 11/hr   Seasonal allergies    Tremors of nervous system    Past Surgical History:  Procedure Laterality Date   COLONOSCOPY     COLONOSCOPY W/ BIOPSIES AND POLYPECTOMY  04/02/2015   MAXILLARY ANTROSTOMY Bilateral 01/25/2018   Procedure: BILATERAL MAXILLARY ANTROSTOMY;  Surgeon: Graylin Shiver, MD;  Location: [facility] OR;  Service: ENT;  Laterality: Bilateral;   NASAL TURBINATE REDUCTION Bilateral 01/25/2018   SINUS ENDO W/FUSION Bilateral 01/25/2018   Procedure: ENDOSCOPIC SINUS SURGERY WITH NAVIGATION;  Surgeon: Graylin Shiver, MD;  Location: [facility] OR;  Service: ENT;  Laterality: Bilateral;   TONSILLECTOMY AND ADENOIDECTOMY     TURBINATE REDUCTION Bilateral 01/25/2018   Procedure: BILATERAL TURBINATE REDUCTION;  Surgeon: Graylin Shiver, MD;  Location: [facility] OR;  Service: ENT;  Laterality: Bilateral;   WISDOM TOOTH EXTRACTION       Current Meds  Medication Sig   albuterol (PROVENTIL) (5 MG/ML) 0.5% nebulizer solution Take 0.5 mLs (2.5 mg total) by nebulization every 6 (six) hours as needed for wheezing or shortness of breath.   albuterol (VENTOLIN HFA) 108 (90 Base) MCG/ACT inhaler INHALE 2 PUFFS INTO THE  LUNGS EVERY 6 (SIX) HOURS AS NEEDED FOR WHEEZING OR SHORTNESS OF BREATH.   Ascorbic Acid (VITAMIN C) 1000 MG tablet Take 1,000 mg by mouth daily.   aspirin 81 MG tablet Take 81 mg by mouth daily.   atorvastatin (LIPITOR) 10 MG tablet TAKE 1 TABLET EVERY DAY   Cholecalciferol (VITAMIN D-3) 1000 UNITS CAPS Take 1,000 Units by mouth daily.   cyclobenzaprine (FLEXERIL) 10 MG tablet Take 0.5-1 tablets (5-10 mg total) by mouth 3 (three) times daily as needed for muscle spasms.   diclofenac (VOLTAREN) 75 MG EC tablet Take 75 mg by mouth 3 (three) times daily.   fluticasone (FLONASE) 50 MCG/ACT nasal spray SPRAY 2 SPRAYS INTO EACH NOSTRIL EVERY DAY (Patient taking differently:  Place 2 sprays into both nostrils as needed.)   gabapentin (NEURONTIN) 800 MG tablet Take 800 mg by mouth 5 (five) times daily.    ibuprofen (ADVIL,MOTRIN) 200 MG tablet Take 200 mg by mouth every 6 (six) hours as needed for mild pain.   INCRUSE ELLIPTA 62.5 MCG/INH AEPB TAKE 1 PUFF BY MOUTH EVERY DAY   ipratropium (ATROVENT) 0.03 % nasal spray Place 2 sprays into both nostrils every 12 (twelve) hours.   levocetirizine (XYZAL) 5 MG tablet TAKE 1 TABLET EVERY EVENING   montelukast (SINGULAIR) 10 MG tablet TAKE 1 TABLET AT BEDTIME   omeprazole (PRILOSEC) 20 MG capsule TAKE 1 CAPSULE EVERY DAY   oxyCODONE-acetaminophen (PERCOCET/ROXICET) 5-325 MG tablet Take 1 tablet by mouth daily as needed.   primidone (MYSOLINE) 250 MG tablet Take 250 mg by mouth 2 (two) times daily.   sildenafil (VIAGRA) 100 MG tablet Take 0.5-1 tablets (50-100 mg total) by mouth daily as needed for erectile dysfunction.   SPIRIVA HANDIHALER 18 MCG inhalation capsule    tamsulosin (FLOMAX) 0.4 MG CAPS capsule TAKE 1 CAPSULE AT BEDTIME   topiramate (TOPAMAX) 25 MG tablet Take 25 mg by mouth 2 (two) times daily.    vitamin B-12 (CYANOCOBALAMIN) 1000 MCG tablet Take 1,000 mcg by mouth daily.   [DISCONTINUED] propranolol (INDERAL) 40 MG tablet TAKE 1 TABLET IN THE MORNING AND TAKE 1/2 TABLET IN THE EVENING     Allergies:   Latex, Contrast media [iodinated diagnostic agents], Codeine, Propoxyphene, and Shellfish-derived products   Social History   Tobacco Use   Smoking status: Former Smoker    Packs/day: 2.00    Years: 30.00    Pack years: 60.00    Types: Cigarettes    Quit date: 07/1999    Years since quitting: 21.2   Smokeless tobacco: Former Neurosurgeon    Types: Snuff, Chew  Vaping Use   Vaping Use: Never used  Substance Use Topics   Alcohol use: Yes    Comment: 01/25/2018 "might have wine 3 times/year"   Drug use: No     Family Hx: The patient's family history includes Alcoholism in his father;  Allergies in his daughter; Arthritis in his father and sister; Breast cancer in his maternal aunt; Cancer in an other family member; Congestive Heart Failure (age of onset: 70) in his brother; Congestive Heart Failure (age of onset: 61) in his mother; Diabetes in his maternal aunt; Emphysema in his brother; Gallbladder disease in his daughter; Heart attack in his brother; Heart disease in his brother and mother; Kidney Stones in his daughter; Lung cancer in his maternal uncle; Migraines in his daughter; Stroke in his maternal aunt. There is no history of Colon cancer, Esophageal cancer, Stomach cancer, or Rectal cancer.  ROS:  Please see the history of present illness.     All other systems reviewed and are negative.   Labs/Other Tests and Data Reviewed:    Recent Labs: No results found for requested labs within last 8760 hours.   Recent Lipid Panel Lab Results  Component Value Date/Time   CHOL 116 05/02/2019 02:44 PM   TRIG 134 05/02/2019 02:44 PM   HDL 33 (L) 05/02/2019 02:44 PM   CHOLHDL 3.5 05/02/2019 02:44 PM   LDLCALC 61 05/02/2019 02:44 PM    Wt Readings from Last 3 Encounters:  11/01/20 (!) 315 lb (142.9 kg)  01/30/20 (!) 304 lb (137.9 kg)  01/01/20 (!) 304 lb 6 oz (138.1 kg)    EKG performed in the office today and showed sinus bradycardia with LVH and marked repol abnormality unchanged from 3/14/20219  Objective:    Vital Signs:  BP 136/70    Pulse (!) 53    Ht 6\' 5"  (1.956 m)    Wt (!) 315 lb (142.9 kg)    SpO2 97%    BMI 37.35 kg/m    GEN: Well nourished, well developed in no acute distress HEENT: Normal NECK: No JVD; No carotid bruits LYMPHATICS: No lymphadenopathy CARDIAC:RRR, no murmurs, rubs, gallops RESPIRATORY:  Clear to auscultation without rales, wheezing or rhonchi  ABDOMEN: Soft, non-tender, non-distended MUSCULOSKELETAL:  No edema; No deformity  SKIN: Warm and dry NEUROLOGIC:  Alert and oriented x 3 PSYCHIATRIC:  Normal affect   ASSESSMENT &  PLAN:    1.  HOCM -apical Variant -he remains asymptomatic -felt not to be a candidate for AICD per EP -he has some mild LE edema on exam and I have encouraged him to follow a low Na diet>>he is eating too many foods with high Na content -I will repeat 2D echo to make sure LVF remains normal  2.  HTN -BP well controlled today -continue Inderal 40mg  qam and 20mg  qpm  3.  OSA - The patient is tolerating PAP therapy well without any problems. The PAP download was reviewed today and showed an AHI of 1.6/hr on 11 cm H2O with 87% compliance in using more than 4 hours nightly.  The patient has been using and benefiting from PAP use and will continue to benefit from therapy.   4. Morbid  Obesity -His BMI is > 35 with cardiac co morbidities -I have encouraged him to get into a routine exercise program and cut back on carbs and portions.  -we discussed referral to [facility] >>he agrees with going to see them  Medication Adjustments/Labs and Tests Ordered: Current medicines are reviewed at length with the patient today.  Concerns regarding medicines are outlined above.  Tests Ordered: Orders Placed This Encounter  Procedures   EKG 12-Lead   Medication Changes: No orders of the defined types were placed in this encounter.   Disposition:  Follow up in 1 year(s)  Signed, , MD  11/01/2020 8:50 AM    [facility]

## 2020-11-01 NOTE — Addendum Note (Signed)
Addended by: Theresia Majors on: 11/01/2020 08:59 AM   Modules accepted: Orders

## 2020-11-25 ENCOUNTER — Other Ambulatory Visit: Payer: Self-pay

## 2020-11-25 ENCOUNTER — Ambulatory Visit (HOSPITAL_COMMUNITY): Payer: Medicare HMO | Attending: Cardiology

## 2020-11-25 DIAGNOSIS — I421 Obstructive hypertrophic cardiomyopathy: Secondary | ICD-10-CM | POA: Diagnosis not present

## 2020-11-25 LAB — ECHOCARDIOGRAM COMPLETE
Area-P 1/2: 3.2 cm2
S' Lateral: 5 cm

## 2020-11-25 MED ORDER — PERFLUTREN LIPID MICROSPHERE
1.0000 mL | INTRAVENOUS | Status: AC | PRN
  Administered 2020-11-25: 2 mL via INTRAVENOUS

## 2020-12-01 ENCOUNTER — Ambulatory Visit (INDEPENDENT_AMBULATORY_CARE_PROVIDER_SITE_OTHER): Payer: Medicare HMO

## 2020-12-01 ENCOUNTER — Telehealth: Payer: Self-pay | Admitting: Cardiology

## 2020-12-01 DIAGNOSIS — I421 Obstructive hypertrophic cardiomyopathy: Secondary | ICD-10-CM

## 2020-12-01 DIAGNOSIS — R001 Bradycardia, unspecified: Secondary | ICD-10-CM

## 2020-12-01 NOTE — Telephone Encounter (Signed)
Warren Reichert, MD  11/25/2020 12:38 PM EST      2D echo showed apical HOCM with thickening of the heart muscle at the tip of the heart and normal LVF. There is trivial leakiness of the MV. He has been evaluated by EP in 2018 with no high risk features. Please find out if he has had any syncope. Please order 3 day ziopatch to rule out ventricular arrhythmias   The patient has been notified of the result and verbalized understanding.  All questions (if any) were answered. Theresia Majors, RN 12/01/2020 5:03 PM  Patient has not had any syncope.  3 day zio patch has been ordered.

## 2020-12-01 NOTE — Telephone Encounter (Signed)
Warren Baker is returning Warren Baker's call in regards to his Echo results. Please advise.

## 2020-12-09 DIAGNOSIS — R001 Bradycardia, unspecified: Secondary | ICD-10-CM | POA: Diagnosis not present

## 2020-12-09 DIAGNOSIS — I421 Obstructive hypertrophic cardiomyopathy: Secondary | ICD-10-CM

## 2020-12-28 ENCOUNTER — Encounter: Payer: Self-pay | Admitting: Cardiology

## 2020-12-28 DIAGNOSIS — I4729 Other ventricular tachycardia: Secondary | ICD-10-CM | POA: Insufficient documentation

## 2020-12-28 DIAGNOSIS — I472 Ventricular tachycardia: Secondary | ICD-10-CM | POA: Insufficient documentation

## 2021-01-03 ENCOUNTER — Encounter (INDEPENDENT_AMBULATORY_CARE_PROVIDER_SITE_OTHER): Payer: Self-pay

## 2021-01-06 ENCOUNTER — Telehealth: Payer: Self-pay

## 2021-01-06 DIAGNOSIS — I472 Ventricular tachycardia: Secondary | ICD-10-CM

## 2021-01-06 DIAGNOSIS — I421 Obstructive hypertrophic cardiomyopathy: Secondary | ICD-10-CM

## 2021-01-06 DIAGNOSIS — I4729 Other ventricular tachycardia: Secondary | ICD-10-CM

## 2021-01-06 NOTE — Telephone Encounter (Signed)
-----   Message from Quintella Reichert, MD sent at 12/28/2020 10:19 PM EST ----- Heart monitor showed occasional extra heart beats from bottom of heart called PVCs but also had a runs of nonsustained ventricular tachycardia that could signal increased risk of sudden cardiac death in setting of HOCM. Please get back in to see Dr.  Elberta Fortis to see if now a candidate for AICD.  Given that he has apical HOCM then my not be a candidate.

## 2021-01-06 NOTE — Telephone Encounter (Signed)
The patient has been notified of the result and verbalized understanding.  All questions (if any) were answered. Theresia Majors, RN 01/06/2021 12:47 PM  Referral has been placed.

## 2021-02-14 ENCOUNTER — Ambulatory Visit: Payer: Medicare HMO | Admitting: Cardiology

## 2021-02-14 ENCOUNTER — Encounter: Payer: Self-pay | Admitting: Cardiology

## 2021-02-14 ENCOUNTER — Other Ambulatory Visit: Payer: Self-pay

## 2021-02-14 VITALS — BP 134/68 | HR 50 | Ht 77.0 in | Wt 316.0 lb

## 2021-02-14 DIAGNOSIS — I421 Obstructive hypertrophic cardiomyopathy: Secondary | ICD-10-CM | POA: Diagnosis not present

## 2021-02-14 NOTE — Progress Notes (Signed)
Electrophysiology Office Note   Date:  02/14/2021   ID:  Warren Baker, DOB 02-24-1951, MRN [id number]  PCP:  Si GaulGriffin, Jenny M, DO  Cardiologist:  Mayford Knifeurner Primary Electrophysiologist:  Will Jorja LoaMartin Camnitz, MD    Chief Complaint: HOCM   History of Present Illness: Warren RidgeWilliam Baker is a 70 y.o. male who is being seen today for the evaluation of HOM[facility] at the request of Turner, Cornelious Bryantraci R, MD. Presenting today for electrophysiology evaluation.  He has a history significant for obstructive sleep apnea on CPAP, and has had an echo showing apical hypertrophic cardiomyopathy.  Cardiac MRI was attempted, though he was too big for the scanner.  He had an uncle that had sudden death in a brother that had a cardiac problem and had an ICD at age 70 in the setting of an MI.    Today, he denies symptoms of palpitations, chest pain, shortness of breath, orthopnea, PND, lower extremity edema, claudication, dizziness, presyncope, syncope, bleeding, or neurologic sequela. The patient is tolerating medications without difficulties.  He is currently feeling well.  He has no chest pain or shortness of breath.  He is able do all of his daily activities and is without restriction.   Past Medical History:  Diagnosis Date  . Arthritis    "legs, back, shoulders" (01/25/2018)  . Chronic back pain    "misalligned discs" (01/25/2018)  . COPD (chronic obstructive pulmonary disease) (HCC)   . Excessive daytime sleepiness 07/13/2016  . Family history of adverse reaction to anesthesia    "think my mom had a hard time waking up one time" (01/25/2018)  . GERD (gastroesophageal reflux disease)   . High cholesterol   . History of colon polyps   . History of kidney stones   . 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 NSVT (nonsustained ventricular tachycardia) (HCC)    7 beats noted on heart monitor 11/2020  . OSA on CPAP 11/01/2016   Mild with AHI 11/hr   . Seasonal allergies   . Tremors of nervous system    Past Surgical History:  Procedure Laterality Date  . COLONOSCOPY    . COLONOSCOPY W/ BIOPSIES AND POLYPECTOMY  04/02/2015  . MAXILLARY ANTROSTOMY Bilateral 01/25/2018   Procedure: BILATERAL MAXILLARY ANTROSTOMY;  Surgeon: Graylin ShiverMarcellino, Amanda J, MD;  [facility];  Service: ENT;  Laterality: Bilateral;  . NASAL TURBINATE REDUCTION Bilateral 01/25/2018  . SINUS ENDO W/FUSION Bilateral 01/25/2018   Procedure: ENDOSCOPIC SINUS SURGERY WITH NAVIGATION;  Surgeon: Graylin ShiverMarcellino, Amanda J, MD;  [facility];  Service: ENT;  Laterality: Bilateral;  . TONSILLECTOMY AND ADENOIDECTOMY    . TURBINATE REDUCTION Bilateral 01/25/2018   Procedure: BILATERAL TURBINATE REDUCTION;  Surgeon: Graylin ShiverMarcellino, Amanda J, MD;  [facility];  Service: ENT;  Laterality: Bilateral;  . WISDOM TOOTH EXTRACTION       Current Outpatient Medications  Medication Sig Dispense Refill  . albuterol (PROVENTIL) (5 MG/ML) 0.5% nebulizer solution Take 0.5 mLs (2.5 mg total) by nebulization every 6 (six) hours as needed for wheezing or shortness of breath. 20 mL 0  . albuterol (VENTOLIN HFA) 108 (90 Base) MCG/ACT inhaler INHALE 2 PUFFS INTO THE LUNGS EVERY 6 (SIX) HOURS AS NEEDED FOR WHEEZING OR SHORTNESS OF BREATH. 18 g 3  . Ascorbic Acid (VITAMIN C) 1000 MG tablet Take 1,000 mg by mouth daily.    Marland Kitchen. aspirin 81 MG tablet Take 81 mg by mouth daily.    .Marland Kitchen  atorvastatin (LIPITOR) 10 MG tablet TAKE 1 TABLET EVERY DAY 90 tablet 0  . Cholecalciferol (VITAMIN D-3) 1000 UNITS CAPS Take 1,000 Units by mouth daily.    . cyclobenzaprine (FLEXERIL) 10 MG tablet Take 0.5-1 tablets (5-10 mg total) by mouth 3 (three) times daily as needed for muscle spasms. 15 tablet 0  . diclofenac (VOLTAREN) 75 MG EC tablet Take 75 mg by mouth 3 (three) times daily.    . fluticasone (FLONASE) 50 MCG/ACT nasal spray SPRAY 2 SPRAYS INTO EACH NOSTRIL EVERY DAY (Patient taking differently: Place 2 sprays into both  nostrils as needed.) 48 mL 1  . gabapentin (NEURONTIN) 800 MG tablet Take 800 mg by mouth 5 (five) times daily.     Marland Kitchen ibuprofen (ADVIL,MOTRIN) 200 MG tablet Take 200 mg by mouth every 6 (six) hours as needed for mild pain.    . INCRUSE ELLIPTA 62.5 MCG/INH AEPB TAKE 1 PUFF BY MOUTH EVERY DAY 30 each 5  . ipratropium (ATROVENT) 0.03 % nasal spray Place 2 sprays into both nostrils every 12 (twelve) hours. 30 mL 12  . levocetirizine (XYZAL) 5 MG tablet TAKE 1 TABLET EVERY EVENING 90 tablet 0  . montelukast (SINGULAIR) 10 MG tablet TAKE 1 TABLET AT BEDTIME 90 tablet 1  . omeprazole (PRILOSEC) 20 MG capsule TAKE 1 CAPSULE EVERY DAY 90 capsule 3  . oxyCODONE-acetaminophen (PERCOCET/ROXICET) 5-325 MG tablet Take 1 tablet by mouth daily as needed.    . primidone (MYSOLINE) 250 MG tablet Take 250 mg by mouth 2 (two) times daily.    . sildenafil (VIAGRA) 100 MG tablet Take 0.5-1 tablets (50-100 mg total) by mouth daily as needed for erectile dysfunction. 10 tablet 2  . SPIRIVA HANDIHALER 18 MCG inhalation capsule     . tamsulosin (FLOMAX) 0.4 MG CAPS capsule TAKE 1 CAPSULE AT BEDTIME 90 capsule 1  . topiramate (TOPAMAX) 25 MG tablet Take 25 mg by mouth 2 (two) times daily.     . vitamin B-12 (CYANOCOBALAMIN) 1000 MCG tablet Take 1,000 mcg by mouth daily.     No current facility-administered medications for this visit.    Allergies:   Latex, Contrast media [iodinated diagnostic agents], Codeine, Propoxyphene, and Shellfish-derived products   Social History:  The patient  reports that he quit smoking about 21 years ago. His smoking use included cigarettes. He has a 60.00 pack-year smoking history. He has quit using smokeless tobacco.  His smokeless tobacco use included snuff and chew. He reports current alcohol use. He reports that he does not use drugs.   Family History:  The patient's family history includes Alcoholism in his father; Allergies in his daughter; Arthritis in his father and sister; Breast  cancer in his maternal aunt; Cancer in an other family member; Congestive Heart Failure (age of onset: 57) in his brother; Congestive Heart Failure (age of onset: 31) in his mother; Diabetes in his maternal aunt; Emphysema in his brother; Gallbladder disease in his daughter; Heart attack in his brother; Heart disease in his brother and mother; Kidney Stones in his daughter; Lung cancer in his maternal uncle; Migraines in his daughter; Stroke in his maternal aunt.    ROS:  Please see the history of present illness.   Otherwise, review of systems is positive for none.   All other systems are reviewed and negative.    PHYSICAL EXAM: VS:  BP 134/68   Pulse (!) 50   Ht 6\' 5"  (1.956 m)   Wt (!) 316 lb (143.3 kg)  BMI 37.47 kg/m  , BMI Body mass index is 37.47 kg/m. GEN: Well nourished, well developed, in no acute distress  HEENT: normal  Neck: no JVD, carotid bruits, or masses Cardiac: RRR; no murmurs, rubs, or gallops,no edema  Respiratory:  clear to auscultation bilaterally, normal work of breathing GI: soft, nontender, nondistended, + BS MS: no deformity or atrophy  Skin: warm and dry Neuro:  Strength and sensation are intact Psych: euthymic mood, full affect  EKG:  EKG is ordered today. Personal review of the ekg ordered  shows sinus rhythm, LVH Recent Labs: No results found for requested labs within last 8760 hours.    Lipid Panel     Component Value Date/Time   CHOL 116 05/02/2019 1444   TRIG 134 05/02/2019 1444   HDL 33 (L) 05/02/2019 1444   CHOLHDL 3.5 05/02/2019 1444   VLDL 28.8 05/15/2018 1447   LDLCALC 61 05/02/2019 1444     Wt Readings from Last 3 Encounters:  02/14/21 (!) 316 lb (143.3 kg)  11/01/20 (!) 315 lb (142.9 kg)  01/30/20 (!) 304 lb (137.9 kg)      Other studies Reviewed: Additional studies/ records that were reviewed today include: TTE 11/25/20  Review of the above records today demonstrates:  1. Apical hypertrophic cardiomyopathy. Intracavitary  obstruction at the  level of the apex, max instantaneous gradient 36 mmHg at rest, 54 mmHg  with Valsalva. No definite apical pouch seen with Definity contrast.  2. Left ventricular ejection fraction, by estimation, is 60 to 65%. The  left ventricle has normal function. The left ventricle has no regional  wall motion abnormalities. There is severe left ventricular hypertrophy of  the apical segment. Left  ventricular diastolic parameters were normal.  3. Right ventricular systolic function is normal. The right ventricular  size is normal. Mildly increased right ventricular wall thickness.  Tricuspid regurgitation signal is inadequate for assessing PA pressure.  4. The mitral valve is normal in structure. Trivial mitral valve  regurgitation. No evidence of mitral stenosis.  5. The aortic valve was not well visualized. Aortic valve regurgitation  is not visualized. No aortic stenosis is present.  6. The inferior vena cava is normal in size with greater than 50%  respiratory variability, suggesting right atrial pressure of 3 mmHg.   Cardiac monitor 12/23/2020 personally reviewed   Predomiant rhythm was sinus bradycardia with average heart rate 55bpm and ranged from 39 to 82bpm.  Nonsustained ventricular tachycardia for 7 beats.  Occasional multifocal PVCs. PVC load < 1%.  Ventricular triplet.  Nonsustained atrial tachycardia with longest episode lasting 27 seconds and fastest episode lasting 7 beats at 156bpm.   ASSESSMENT AND PLAN:  1.  Apical hypertrophic cardiomyopathy: Patient is asymptomatic.  Repeat echo shows a normal ejection fraction with apical hypertrophy.  He wore a cardiac monitor that showed a 7 beat run of nonsustained VT and a PVC burden of less than 1%.  At this point, I do not feel that he has a strong indication for defibrillator.  Also, the patient would like to avoid a defibrillator.  He did have some nonsustained VT, but he does not have any other high risk  factors.  Unfortunately he could not fit in an MRI scanner.  Additionally, at age 89, he has not had any cardiac episodes.  2.  Morbid obesity: Diet and exercise encouraged  3.  Obstructive sleep apnea: CPAP compliance encouraged  4.  Hypertension: Currently well controlled  Case discussed with primary cardiology  Current  medicines are reviewed at length with the patient today.   The patient does not have concerns regarding his medicines.  The following changes were made today:  none  Labs/ tests ordered today include:  Orders Placed This Encounter  Procedures  . EKG 12-Lead     Disposition:   FU with Will Camnitz as needed  Signed, Will Jorja Loa, MD  02/14/2021 10:41 AM     [facility] [address] [place] [address] [phone] (office) [phone] (fax)

## 2021-02-14 NOTE — Patient Instructions (Signed)
Medication Instructions:  Your physician recommends that you continue on your current medications as directed. Please refer to the Current Medication list given to you today.  *If you need a refill on your cardiac medications before your next appointment, please call your pharmacy*   Lab Work: None ordered   Testing/Procedures: None ordered   Follow-Up: At [facility], you and your health needs are our priority.  As part of our continuing mission to provide you with exceptional heart care, we have created designated Provider Care Teams.  These Care Teams include your primary Cardiologist (physician) and Advanced Practice Providers (APPs -  Physician Assistants and Nurse Practitioners) who all work together to provide you with the care you need, when you need it.  Your next appointment:   as  needed  The format for your next appointment:   In Person  Provider:   Will Camnitz, MD    Thank you for choosing [facility]!!   Kenny Stern, RN [phone]        

## 2021-02-15 ENCOUNTER — Ambulatory Visit (INDEPENDENT_AMBULATORY_CARE_PROVIDER_SITE_OTHER): Payer: Self-pay | Admitting: Family Medicine

## 2021-03-01 ENCOUNTER — Ambulatory Visit (INDEPENDENT_AMBULATORY_CARE_PROVIDER_SITE_OTHER): Payer: Self-pay | Admitting: Family Medicine

## 2021-12-23 ENCOUNTER — Telehealth: Payer: Self-pay | Admitting: Cardiology

## 2021-12-23 NOTE — Telephone Encounter (Signed)
° °  Dr. Kennon Holter with [facility] calling, she said she saw pt yesterday and pt gained a significant weight. She would like to speak with Dr. Radford Pax for plan of care. Per Carly Dr. Radford Pax will cb

## 2021-12-26 NOTE — Telephone Encounter (Signed)
Dr. Robby Sermon is calling back in regards to this pt due to not being called back in regards to this. She is requesting the patient be called to schedule an appointment due to the weight gained noticed when she saw the pt and being unsure of what is going on. Please advise.

## 2021-12-29 NOTE — Telephone Encounter (Signed)
Left message for patient to call back  

## 2022-01-03 NOTE — Telephone Encounter (Signed)
Spoke with the patient and have scheduled him for an appointment with Dr. Mayford Knife.

## 2022-01-04 NOTE — Progress Notes (Signed)
Date:  01/05/2022   ID:  Wess Botts, DOB 1951-08-30, MRN [id number]  PCP:  Shanon Ace, MD  Cardiologist:  Fransico Him, MD  Electrophysiologist:  None   Chief Complaint:  HOCM, HTN, OSA  History of Present Illness:    Warren Baker is a 71 y.o. male  with a hx of mild OSA with an AHI of 11.3/hr and is on CPAP therapy.  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  He has an uncle that had SCD and a brother who had a cardiac problem and got an AICD at 71yo in setting of MI.  He was referred to EP but felt that AICD was not indicated at this time.    He is here today for followup and is doing well.  He has chronic DOE due to COPD.  He got COVID a year ago and then developed asthma on top of his COPD so his SOB got worse but it has been stable since then. He denies any chest pain or pressure, PND, orthopnea, LE edema, dizziness, palpitations or syncope. He is compliant with his meds and is tolerating meds with no SE.     He is doing well with his CPAP device and thinks that he has gotten used to it.  He tolerates the mask and feels the pressure is adequate.  Since going on CPAP he feels rested in the am and has no significant daytime sleepiness.  He denies any significant mouth or nasal dryness or nasal congestion.  He does not think that he snores.     Prior CV studies:   The following studies were reviewed today:  PAP compliance download, EKG  Past Medical History:  Diagnosis Date   Arthritis    "legs, back, shoulders" (01/25/2018)   Chronic back pain    "misalligned discs" (01/25/2018)   COPD (chronic obstructive pulmonary disease) ([place])    Excessive daytime sleepiness 07/13/2016   Family history of adverse reaction to anesthesia    "think my mom had a hard time waking up one time" (01/25/2018)   GERD (gastroesophageal reflux disease)    High cholesterol    History of colon polyps     History of kidney stones    HOCM (hypertrophic obstructive cardiomyopathy) ([place])    apical variant by echo with no history of syncope   NSVT (nonsustained ventricular tachycardia)    7 beats noted on heart monitor 11/2020   OSA on CPAP 11/01/2016   Mild with AHI 11/hr   Seasonal allergies    Tremors of nervous system    Past Surgical History:  Procedure Laterality Date   COLONOSCOPY     COLONOSCOPY W/ BIOPSIES AND POLYPECTOMY  04/02/2015   MAXILLARY ANTROSTOMY Bilateral 01/25/2018   Procedure: BILATERAL MAXILLARY ANTROSTOMY;  Surgeon: Helayne Seminole, MD;  Location: [facility];  Service: ENT;  Laterality: Bilateral;   NASAL TURBINATE REDUCTION Bilateral 01/25/2018   SINUS ENDO W/FUSION Bilateral 01/25/2018   Procedure: ENDOSCOPIC SINUS SURGERY WITH NAVIGATION;  Surgeon: Helayne Seminole, MD;  Location: [facility];  Service: ENT;  Laterality: Bilateral;   TONSILLECTOMY AND ADENOIDECTOMY     TURBINATE REDUCTION Bilateral 01/25/2018   Procedure: BILATERAL TURBINATE REDUCTION;  Surgeon: Helayne Seminole, MD;  Location: [facility];  Service: ENT;  Laterality: Bilateral;   WISDOM TOOTH EXTRACTION       Current Meds  Medication Sig   albuterol (PROVENTIL) (  5 MG/ML) 0.5% nebulizer solution Take 0.5 mLs (2.5 mg total) by nebulization every 6 (six) hours as needed for wheezing or shortness of breath.   albuterol (VENTOLIN HFA) 108 (90 Base) MCG/ACT inhaler INHALE 2 PUFFS INTO THE LUNGS EVERY 6 (SIX) HOURS AS NEEDED FOR WHEEZING OR SHORTNESS OF BREATH.   Ascorbic Acid (VITAMIN C) 1000 MG tablet Take 1,000 mg by mouth daily.   aspirin 81 MG tablet Take 81 mg by mouth daily.   atorvastatin (LIPITOR) 10 MG tablet TAKE 1 TABLET EVERY DAY   Cholecalciferol (VITAMIN D-3) 1000 UNITS CAPS Take 1,000 Units by mouth daily.   cyclobenzaprine (FLEXERIL) 10 MG tablet Take 0.5-1 tablets (5-10 mg total) by mouth 3 (three) times daily as needed for muscle spasms.   diclofenac (VOLTAREN) 75 MG EC tablet Take 75 mg  by mouth 3 (three) times daily.   fluticasone (FLONASE) 50 MCG/ACT nasal spray SPRAY 2 SPRAYS INTO EACH NOSTRIL EVERY DAY (Patient taking differently: Place 2 sprays into both nostrils as needed.)   Fluticasone-Umeclidin-Vilant 100-62.5-25 MCG/ACT AEPB Inhale 1 puff into the lungs daily.   furosemide (LASIX) 40 MG tablet Take 1 tablet by mouth daily.   gabapentin (NEURONTIN) 800 MG tablet Take 800 mg by mouth 5 (five) times daily.    ibuprofen (ADVIL,MOTRIN) 200 MG tablet Take 200 mg by mouth every 6 (six) hours as needed for mild pain.   ipratropium (ATROVENT) 0.03 % nasal spray Place 2 sprays into both nostrils every 12 (twelve) hours.   levocetirizine (XYZAL) 5 MG tablet TAKE 1 TABLET EVERY EVENING   montelukast (SINGULAIR) 10 MG tablet TAKE 1 TABLET AT BEDTIME   omeprazole (PRILOSEC) 20 MG capsule TAKE 1 CAPSULE EVERY DAY   oxyCODONE-acetaminophen (PERCOCET/ROXICET) 5-325 MG tablet Take 1 tablet by mouth daily as needed.   primidone (MYSOLINE) 250 MG tablet Take 250 mg by mouth 2 (two) times daily.   sildenafil (VIAGRA) 100 MG tablet Take 0.5-1 tablets (50-100 mg total) by mouth daily as needed for erectile dysfunction.   tamsulosin (FLOMAX) 0.4 MG CAPS capsule TAKE 1 CAPSULE AT BEDTIME   topiramate (TOPAMAX) 25 MG tablet Take 25 mg by mouth 2 (two) times daily.    vitamin B-12 (CYANOCOBALAMIN) 1000 MCG tablet Take 1,000 mcg by mouth daily.     Allergies:   Latex, Contrast media [iodinated contrast media], Codeine, Propoxyphene, and Shellfish-derived products   Social History   Tobacco Use   Smoking status: Former    Packs/day: 2.00    Years: 30.00    Pack years: 60.00    Types: Cigarettes    Quit date: 07/1999    Years since quitting: 22.4   Smokeless tobacco: Former    Types: Snuff, Chew  Vaping Use   Vaping Use: Never used  Substance Use Topics   Alcohol use: Yes    Comment: 01/25/2018 "might have wine 3 times/year"   Drug use: No     Family Hx: The patient's family  history includes Alcoholism in his father; Allergies in his daughter; Arthritis in his father and sister; Breast cancer in his maternal aunt; Cancer in an other family member; Congestive Heart Failure (age of onset: 56) in his brother; Congestive Heart Failure (age of onset: 64) in his mother; Diabetes in his maternal aunt; Emphysema in his brother; Gallbladder disease in his daughter; Heart attack in his brother; Heart disease in his brother and mother; Kidney Stones in his daughter; Lung cancer in his maternal uncle; Migraines in his daughter; Stroke in his  maternal aunt. There is no history of Colon cancer, Esophageal cancer, Stomach cancer, or Rectal cancer.  ROS:   Please see the history of present illness.     All other systems reviewed and are negative.   Labs/Other Tests and Data Reviewed:    Recent Labs: No results found for requested labs within last 8760 hours.   Recent Lipid Panel Lab Results  Component Value Date/Time   CHOL 116 05/02/2019 02:44 PM   TRIG 134 05/02/2019 02:44 PM   HDL 33 (L) 05/02/2019 02:44 PM   CHOLHDL 3.5 05/02/2019 02:44 PM   LDLCALC 61 05/02/2019 02:44 PM    Wt Readings from Last 3 Encounters:  01/05/22 (!) 337 lb (152.9 kg)  02/14/21 (!) 316 lb (143.3 kg)  11/01/20 (!) 315 lb (142.9 kg)    EKG performed in the office today and showed sinus bradycardia with marked ST/T wave abnormality in the anterior leads with LVH unchanged from 2022  Objective:    Vital Signs:  BP 138/68    Pulse (!) 53    Ht 6\' 5"  (1.956 m)    Wt (!) 337 lb (152.9 kg)    SpO2 95%    BMI 39.96 kg/m    GEN: Well nourished, well developed in no acute distress HEENT: Normal NECK: No JVD; No carotid bruits LYMPHATICS: No lymphadenopathy CARDIAC:RRR, no murmurs, rubs, gallops RESPIRATORY:  scattered expiratory wheezes ABDOMEN: Soft, non-tender, non-distended MUSCULOSKELETAL:  2+ BLE edema; No deformity  SKIN: Warm and dry NEUROLOGIC:  Alert and oriented x 3 PSYCHIATRIC:   Normal affect   ASSESSMENT & PLAN:    1.  HOCM -apical Variant -He is completely asymptomatic and denies any dizzy spells, syncope and SOB from COPD/asthma is table -felt not to be a candidate for AICD per EP -2D echo 11/2020 showed apical HOCM with intracavitary obstruction at the level of the apex with maximum instantaneous gradient 36 mm at rest and 54 mm with Valsalva.  EF 60 to 65% -I will get a 3-day Zio patch to rule out arrhythmias -Continue beta-blocker therapy but change from non selective to B1 selective BB due to asthma  2.  HTN -BP is well controlled on exam today -given his asthma I think we should change him from a non selective BB to Toprol XL 50mg  daily -check BP daily for a week and call with results  3.  OSA - The patient is tolerating PAP therapy well without any problems. The PAP download performed by his DME was personally reviewed and interpreted by me today and showed an AHI of 1.6/hr on 11 cm H2O with 87% compliance in using more than 4 hours nightly.  The patient has been using and benefiting from PAP use and will continue to benefit from therapy.    4. Morbid  Obesity -His BMI is > 35 with cardiac co morbidities and he has gained 20lbs since I saw him last -I have encouraged him to get into a routine exercise program and cut back on carbs and portions.  -he was referred to [facility] at [facility] but could not do it due to conflicts with work  5.  Family hx of premature CAD -He has multiple CRFs and I have recommended coronary Ca score to help assess future cardiac risk  Medication Adjustments/Labs and Tests Ordered: Current medicines are reviewed at length with the patient today.  Concerns regarding medicines are outlined above.  Tests Ordered: Orders Placed This Encounter  Procedures   EKG  12-Lead   Medication Changes: No orders of the defined types were placed in this encounter.   Disposition:  Follow up in 1 year(s)  Signed, Fransico Him, MD  01/05/2022 11:30 AM    [facility]

## 2022-01-05 ENCOUNTER — Other Ambulatory Visit: Payer: Self-pay

## 2022-01-05 ENCOUNTER — Encounter: Payer: Self-pay | Admitting: Cardiology

## 2022-01-05 ENCOUNTER — Other Ambulatory Visit: Payer: Self-pay | Admitting: Cardiology

## 2022-01-05 ENCOUNTER — Ambulatory Visit: Payer: Medicare HMO | Admitting: Cardiology

## 2022-01-05 ENCOUNTER — Ambulatory Visit (INDEPENDENT_AMBULATORY_CARE_PROVIDER_SITE_OTHER): Payer: Medicare HMO

## 2022-01-05 VITALS — BP 138/68 | HR 53 | Ht 77.0 in | Wt 337.0 lb

## 2022-01-05 DIAGNOSIS — G4733 Obstructive sleep apnea (adult) (pediatric): Secondary | ICD-10-CM | POA: Diagnosis not present

## 2022-01-05 DIAGNOSIS — I421 Obstructive hypertrophic cardiomyopathy: Secondary | ICD-10-CM | POA: Diagnosis not present

## 2022-01-05 DIAGNOSIS — I10 Essential (primary) hypertension: Secondary | ICD-10-CM | POA: Diagnosis not present

## 2022-01-05 DIAGNOSIS — R001 Bradycardia, unspecified: Secondary | ICD-10-CM

## 2022-01-05 DIAGNOSIS — Z8249 Family history of ischemic heart disease and other diseases of the circulatory system: Secondary | ICD-10-CM

## 2022-01-05 MED ORDER — METOPROLOL SUCCINATE ER 50 MG PO TB24: 50.0000 mg | ORAL_TABLET | Freq: Every day | ORAL | 3 refills | Status: AC

## 2022-01-05 NOTE — Progress Notes (Unsigned)
Enrolled pt for a 3 day Zio XT to be mailed to home address.

## 2022-01-05 NOTE — Patient Instructions (Signed)
Check your blood pressure daily for 1 week and call with a list of your readings.  Medication Instructions:  Your physician has recommended you make the following change in your medication: 1) STOP Propranolol 2) START Toprol XL 50mg  once daily  Testing/Procedures: Your physician has recommended that you wear an event monitor. Event monitors are medical devices that record the hearts electrical activity. Doctors most often these monitors to diagnose arrhythmias. Arrhythmias are problems with the speed or rhythm of the heartbeat. The monitor is a small, portable device. You can wear one while you do your normal daily activities. This is usually used to diagnose what is causing palpitations/syncope (passing out).    Follow-Up: At [facility], you and your health needs are our priority.  As part of our continuing mission to provide you with exceptional heart care, we have created designated Provider Care Teams.  These Care Teams include your primary Cardiologist (physician) and Advanced Practice Providers (APPs -  Physician Assistants and Nurse Practitioners) who all work together to provide you with the care you need, when you need it.  We recommend signing up for the patient portal called "MyChart".  Sign up information is provided on this After Visit Summary.  MyChart is used to connect with patients for Virtual Visits (Telemedicine).  Patients are able to view lab/test results, encounter notes, upcoming appointments, etc.  Non-urgent messages can be sent to your provider as well.   To learn more about what you can do with MyChart, go to [facility].    Your next appointment:   1 year(s)  The format for your next appointment:   In Person  Provider:   [email or web address], MD     Other Instructions ZIO XT- Long Term Monitor Instructions  Your physician has requested you wear a ZIO patch monitor for 3 days.  This is a single patch monitor. [employer] supplies one patch monitor  per enrollment. Additional stickers are not available. Please do not apply patch if you will be having a Nuclear Stress Test,  Echocardiogram, Cardiac CT, MRI, or Chest Xray during the period you would be wearing the  monitor. The patch cannot be worn during these tests. You cannot remove and re-apply the  ZIO XT patch monitor.  Your ZIO patch monitor will be mailed 3 day USPS to your address on file. It may take 3-5 days  to receive your monitor after you have been enrolled.  Once you have received your monitor, please review the enclosed instructions. Your monitor  has already been registered assigning a specific monitor serial # to you.  Billing and Patient Assistance Program Information  We have supplied [employer] with any of your insurance information on file for billing purposes. [employer] offers a sliding scale Patient Assistance Program for patients that do not have  insurance, or whose insurance does not completely cover the cost of the ZIO monitor.  You must apply for the Patient Assistance Program to qualify for this discounted rate.  To apply, please call [employer] at [phone], select option 4, select option 2, ask to apply for  Patient Assistance Program. [phone] will ask your household income, and how many people  are in your household. They will quote your out-of-pocket cost based on that information.  [employer] will also be able to set up a 32-month, interest-free payment plan if needed.  Applying the monitor   Shave hair from upper left chest.  Hold abrader disc by orange tab. Rub abrader in 40 strokes over the  upper left chest as  indicated in your monitor instructions.  Clean area with 4 enclosed alcohol pads. Let dry.  Apply patch as indicated in monitor instructions. Patch will be placed under collarbone on left  side of chest with arrow pointing upward.  Rub patch adhesive wings for 2 minutes. Remove white label marked "1". Remove the white  label marked "2". Rub patch  adhesive wings for 2 additional minutes.  While looking in a mirror, press and release button in center of patch. A small green light will  flash 3-4 times. This will be your only indicator that the monitor has been turned on.  Do not shower for the first 24 hours. You may shower after the first 24 hours.  Press the button if you feel a symptom. You will hear a small click. Record Date, Time and  Symptom in the Patient Logbook.  When you are ready to remove the patch, follow instructions on the last 2 pages of Patient  Logbook. Stick patch monitor onto the last page of Patient Logbook.  Place Patient Logbook in the blue and white box. Use locking tab on box and tape box closed  securely. The blue and white box has prepaid postage on it. Please place it in the mailbox as  soon as possible. Your physician should have your test results approximately 7 days after the  monitor has been mailed back to [facility].  Call [facility] Customer Care at [phone] if you have questions regarding  your ZIO XT patch monitor. Call them immediately if you see an orange light blinking on your  monitor.  If your monitor falls off in less than 4 days, contact our Monitor department at [phone].  If your monitor becomes loose or falls off after 4 days call [employer] at [phone] for  suggestions on securing your monitor

## 2022-01-05 NOTE — Addendum Note (Signed)
Addended by: Molli Barrows on: 01/05/2022 11:36 AM   Modules accepted: Orders

## 2022-01-07 DIAGNOSIS — I421 Obstructive hypertrophic cardiomyopathy: Secondary | ICD-10-CM

## 2022-01-07 DIAGNOSIS — R001 Bradycardia, unspecified: Secondary | ICD-10-CM | POA: Diagnosis not present

## 2022-01-31 ENCOUNTER — Telehealth: Payer: Self-pay

## 2022-01-31 DIAGNOSIS — I4729 Other ventricular tachycardia: Secondary | ICD-10-CM

## 2022-01-31 DIAGNOSIS — R001 Bradycardia, unspecified: Secondary | ICD-10-CM

## 2022-01-31 DIAGNOSIS — I491 Atrial premature depolarization: Secondary | ICD-10-CM

## 2022-01-31 NOTE — Telephone Encounter (Signed)
-----   Message from Quintella Reichert, MD sent at 01/18/2022  4:37 PM EST ----- ?Patient is having frequent PACs and nonsustained atrial tachycardia.  Cannot increase BB further due to resting bradycardia.  Please refer to EP for further recommendations.  ?

## 2022-01-31 NOTE — Telephone Encounter (Signed)
The patient has been notified of the result and verbalized understanding.  All questions (if any) were answered. ?Theresia Majors, RN 01/31/2022 11:57 AM  ?Referral has been placed.  ?

## 2022-02-20 ENCOUNTER — Encounter: Payer: Self-pay | Admitting: *Deleted

## 2022-02-20 ENCOUNTER — Encounter: Payer: Self-pay | Admitting: Cardiology

## 2022-02-20 ENCOUNTER — Ambulatory Visit: Payer: Medicare HMO | Admitting: Cardiology

## 2022-02-20 VITALS — BP 138/66 | HR 54 | Ht 77.0 in | Wt 341.0 lb

## 2022-02-20 DIAGNOSIS — I421 Obstructive hypertrophic cardiomyopathy: Secondary | ICD-10-CM

## 2022-02-20 NOTE — Progress Notes (Signed)
? ?Electrophysiology Office Note ? ? ?Date:  02/20/2022  ? ?ID:  Warren Baker, DOB 1951/05/25, MRN [id number] ? ?PCP:  Melvenia Beamlark, Sydney Michelle, MD  ?Cardiologist:  Mayford Knifeurner ?Primary Electrophysiologist:  Latika Kronick Jorja LoaMartin Zyere Jiminez, MD   ? ?Chief Complaint: HOCM ?  ?History of Present Illness: ?Warren RidgeWilliam Duggan is a 71 y.o. male who is being seen today for the evaluation of [facility] at the request of Turner, Cornelious Bryantraci R, MD. Presenting today for electrophysiology evaluation. ? ?He has a history significant for obstructive sleep apnea on CPAP, apical hypertrophic cardiomyopathy.  Cardiac MRI was attempted but he was too big for the scanner.  His uncle had sudden death in his brother had a cardiac problem and had an ICD implanted at age 71 in the setting of an MI. ? ?Today, denies symptoms of palpitations, chest pain, shortness of breath, orthopnea, PND, lower extremity edema, claudication, dizziness, presyncope, syncope, bleeding, or neurologic sequela. The patient is tolerating medications without difficulties.  He is feeling well.  He has no awareness of arrhythmia.  He did wear a cardiac monitor that showed short episodes of SVT and a 1% supraventricular burden.  Despite that, he is asymptomatic.  He does have baseline shortness of breath.  He has COPD and developed COVID with some scarring in his lungs. ? ? ?Past Medical History:  ?Diagnosis Date  ? Arthritis   ? "legs, back, shoulders" (01/25/2018)  ? Chronic back pain   ? "misalligned discs" (01/25/2018)  ? COPD (chronic obstructive pulmonary disease) (HCC)   ? Excessive daytime sleepiness 07/13/2016  ? Family history of adverse reaction to anesthesia   ? "think my mom had a hard time waking up one time" (01/25/2018)  ? GERD (gastroesophageal reflux disease)   ? High cholesterol   ? History of colon polyps   ? History of kidney stones   ? HOCM (hypertrophic obstructive cardiomyopathy) (HCC)   ? apical variant by echo with no history of syncope  ? NSVT (nonsustained ventricular  tachycardia) (HCC)   ? 7 beats noted on heart monitor 11/2020  ? OSA on CPAP 11/01/2016  ? Mild with AHI 11/hr  ? Seasonal allergies   ? Tremors of nervous system   ? ?Past Surgical History:  ?Procedure Laterality Date  ? COLONOSCOPY    ? COLONOSCOPY W/ BIOPSIES AND POLYPECTOMY  04/02/2015  ? MAXILLARY ANTROSTOMY Bilateral 01/25/2018  ? Procedure: BILATERAL MAXILLARY ANTROSTOMY;  Surgeon: Graylin ShiverMarcellino, Amanda J, MD;  [facility];  Service: ENT;  Laterality: Bilateral;  ? NASAL TURBINATE REDUCTION Bilateral 01/25/2018  ? SINUS ENDO W/FUSION Bilateral 01/25/2018  ? Procedure: ENDOSCOPIC SINUS SURGERY WITH NAVIGATION;  Surgeon: Graylin ShiverMarcellino, Amanda J, MD;  [facility];  Service: ENT;  Laterality: Bilateral;  ? TONSILLECTOMY AND ADENOIDECTOMY    ? TURBINATE REDUCTION Bilateral 01/25/2018  ? Procedure: BILATERAL TURBINATE REDUCTION;  Surgeon: Graylin ShiverMarcellino, Amanda J, MD;  [facility];  Service: ENT;  Laterality: Bilateral;  ? WISDOM TOOTH EXTRACTION    ? ? ? ?Current Outpatient Medications  ?Medication Sig Dispense Refill  ? albuterol (PROVENTIL) (5 MG/ML) 0.5% nebulizer solution Take 0.5 mLs (2.5 mg total) by nebulization every 6 (six) hours as needed for wheezing or shortness of breath. 20 mL 0  ? albuterol (VENTOLIN HFA) 108 (90 Base) MCG/ACT inhaler INHALE 2 PUFFS INTO THE LUNGS EVERY 6 (SIX) HOURS AS NEEDED FOR WHEEZING OR SHORTNESS OF BREATH. 18 g 3  ? Ascorbic Acid (VITAMIN C) 1000 MG tablet Take 1,000 mg by mouth daily.    ?  aspirin 81 MG tablet Take 81 mg by mouth daily.    ? atorvastatin (LIPITOR) 10 MG tablet TAKE 1 TABLET EVERY DAY 90 tablet 0  ? Cholecalciferol (VITAMIN D-3) 1000 UNITS CAPS Take 1,000 Units by mouth daily.    ? clotrimazole-betamethasone (LOTRISONE) cream Apply topically as needed.    ? cyclobenzaprine (FLEXERIL) 10 MG tablet Take 0.5-1 tablets (5-10 mg total) by mouth 3 (three) times daily as needed for muscle spasms. 15 tablet 0  ? diclofenac (VOLTAREN) 75 MG EC tablet Take 75 mg by mouth 3  (three) times daily.    ? fluticasone (FLONASE) 50 MCG/ACT nasal spray SPRAY 2 SPRAYS INTO EACH NOSTRIL EVERY DAY 48 mL 1  ? Fluticasone-Umeclidin-Vilant 100-62.5-25 MCG/ACT AEPB Inhale 1 puff into the lungs daily.    ? furosemide (LASIX) 40 MG tablet Take 1 tablet by mouth daily.    ? gabapentin (NEURONTIN) 800 MG tablet Take 800 mg by mouth 5 (five) times daily.     ? ibuprofen (ADVIL,MOTRIN) 200 MG tablet Take 200 mg by mouth every 6 (six) hours as needed for mild pain.    ? ipratropium (ATROVENT) 0.06 % nasal spray Place 1 spray into both nostrils in the morning and at bedtime.    ? levocetirizine (XYZAL) 5 MG tablet TAKE 1 TABLET EVERY EVENING 90 tablet 0  ? metoprolol succinate (TOPROL-XL) 50 MG 24 hr tablet Take 1 tablet (50 mg total) by mouth daily. Take with or immediately following a meal. 90 tablet 3  ? montelukast (SINGULAIR) 10 MG tablet TAKE 1 TABLET AT BEDTIME 90 tablet 1  ? omeprazole (PRILOSEC) 20 MG capsule TAKE 1 CAPSULE EVERY DAY 90 capsule 3  ? oxyCODONE-acetaminophen (PERCOCET/ROXICET) 5-325 MG tablet Take 1 tablet by mouth daily as needed.    ? primidone (MYSOLINE) 250 MG tablet Take 250 mg by mouth 2 (two) times daily.    ? sildenafil (REVATIO) 20 MG tablet Take 20 mg by mouth as directed.    ? SPIRIVA HANDIHALER 18 MCG inhalation capsule     ? tamsulosin (FLOMAX) 0.4 MG CAPS capsule TAKE 1 CAPSULE AT BEDTIME 90 capsule 1  ? topiramate (TOPAMAX) 100 MG tablet Take 1.5 tablets by mouth in the morning and at bedtime.    ? vitamin B-12 (CYANOCOBALAMIN) 1000 MCG tablet Take 1,000 mcg by mouth daily.    ? ?No current facility-administered medications for this visit.  ? ? ?Allergies:   Latex, Contrast media [iodinated contrast media], Codeine, Propoxyphene, and Shellfish-derived products  ? ?Social History:  The patient  reports that he quit smoking about 22 years ago. His smoking use included cigarettes. He has a 60.00 pack-year smoking history. He has quit using smokeless tobacco.  His smokeless  tobacco use included snuff and chew. He reports current alcohol use. He reports that he does not use drugs.  ? ?Family History:  The patient's family history includes Alcoholism in his father; Allergies in his daughter; Arthritis in his father and sister; Breast cancer in his maternal aunt; Cancer in an other family member; Congestive Heart Failure (age of onset: 54) in his brother; Congestive Heart Failure (age of onset: 14) in his mother; Diabetes in his maternal aunt; Emphysema in his brother; Gallbladder disease in his daughter; Heart attack in his brother; Heart disease in his brother and mother; Kidney Stones in his daughter; Lung cancer in his maternal uncle; Migraines in his daughter; Stroke in his maternal aunt.  ? ?ROS:  Please see the history of present illness.  Otherwise, review of systems is positive for none.   All other systems are reviewed and negative.  ? ?PHYSICAL EXAM: ?VS:  BP 138/66   Pulse (!) 54   Ht 6\' 5"  (1.956 m)   Wt (!) 341 lb (154.7 kg)   SpO2 96%   BMI 40.44 kg/m?  , BMI Body mass index is 40.44 kg/m?. ?GEN: Well nourished, well developed, in no acute distress  ?HEENT: normal  ?Neck: no JVD, carotid bruits, or masses ?Cardiac: RRR; no murmurs, rubs, or gallops,no edema  ?Respiratory:  clear to auscultation bilaterally, normal work of breathing ?GI: soft, nontender, nondistended, + BS ?MS: no deformity or atrophy  ?Skin: warm and dry ?Neuro:  Strength and sensation are intact ?Psych: euthymic mood, full affect ? ?EKG:  EKG is ordered today. ?Personal review of the ekg ordered shows sinus rhythm, LVH ? ?Recent Labs: ?No results found for requested labs within last 8760 hours.  ? ? ?Lipid Panel  ?   ?Component Value Date/Time  ? CHOL 116 05/02/2019 1444  ? TRIG 134 05/02/2019 1444  ? HDL 33 (L) 05/02/2019 1444  ? CHOLHDL 3.5 05/02/2019 1444  ? VLDL 28.8 05/15/2018 1447  ? LDLCALC 61 05/02/2019 1444  ? ? ? ?Wt Readings from Last 3 Encounters:  ?02/20/22 (!) 341 lb (154.7 kg)   ?01/05/22 (!) 337 lb (152.9 kg)  ?02/14/21 (!) 316 lb (143.3 kg)  ?  ? ? ?Other studies Reviewed: ?Additional studies/ records that were reviewed today include: TTE 11/25/20  ?Review of the above records today demonstrate

## 2022-02-20 NOTE — Patient Instructions (Signed)
Medication Instructions:  °Your physician recommends that you continue on your current medications as directed. Please refer to the Current Medication list given to you today. ° °*If you need a refill on your cardiac medications before your next appointment, please call your pharmacy* ° ° °Lab Work: °None ordered ° ° °Testing/Procedures: °None ordered ° ° °Follow-Up: °At [facility], you and your health needs are our priority.  As part of our continuing mission to provide you with exceptional heart care, we have created designated Provider Care Teams.  These Care Teams include your primary Cardiologist (physician) and Advanced Practice Providers (APPs -  Physician Assistants and Nurse Practitioners) who all work together to provide you with the care you need, when you need it. ° °Your next appointment:   °As   needed ° °The format for your next appointment:   °In Person ° °Provider:   °Will Camnitz, MD ° ° ° °Thank you for choosing [facility]!! ° ° °Amiracle Neises, RN °[phone] ° °

## 2022-02-20 NOTE — Addendum Note (Signed)
Addended by: Ulice Brilliant T on: 02/20/2022 11:08 AM ? ? Modules accepted: Orders ? ?

## 2022-04-10 ENCOUNTER — Encounter: Payer: Self-pay | Admitting: Gastroenterology

## 2022-06-09 ENCOUNTER — Telehealth: Payer: Self-pay | Admitting: Cardiology

## 2022-06-09 DIAGNOSIS — G4733 Obstructive sleep apnea (adult) (pediatric): Secondary | ICD-10-CM

## 2022-06-09 NOTE — Telephone Encounter (Signed)
Patient's daughter called for a order for a new C-Pap machine to be sent to the insurance company.

## 2022-06-16 NOTE — Telephone Encounter (Signed)
Please write an Rx for new cpap.  Patient setting is on: Mode CPAP Set pressure 11 cmH2O EPR Fulltime EPR level 3

## 2022-06-26 NOTE — Telephone Encounter (Signed)
Per Dr Mayford Knife, Order ResMed CPAP at 11 cm H2O with heated humidity mask of choice and get a download in 4 weeks.

## 2022-06-26 NOTE — Telephone Encounter (Signed)
Order placed to [facility].

## 2022-06-28 ENCOUNTER — Encounter (INDEPENDENT_AMBULATORY_CARE_PROVIDER_SITE_OTHER): Payer: Self-pay

## 2022-06-30 NOTE — Telephone Encounter (Signed)
Order has been resent to [facility] because Per Bernita Buffy does not take [facility].

## 2022-07-26 ENCOUNTER — Telehealth: Payer: Self-pay | Admitting: Cardiology

## 2022-07-26 NOTE — Telephone Encounter (Signed)
07/26/22 called to schedule sleep compliance appt, MB full - [facility]

## 2022-10-04 ENCOUNTER — Ambulatory Visit: Payer: Medicare HMO | Admitting: Cardiology

## 2022-10-20 DEATH — deceased
